# Patient Record
Sex: Male | Born: 1935 | Race: White | Hispanic: No | Smoking: Never smoker
Health system: Southern US, Community
[De-identification: ages and names within clinical notes are randomized; demographics above are authoritative.]

## PROBLEM LIST (undated history)

## (undated) DIAGNOSIS — E119 Type 2 diabetes mellitus without complications: Secondary | ICD-10-CM

## (undated) DIAGNOSIS — I214 Non-ST elevation (NSTEMI) myocardial infarction: Secondary | ICD-10-CM

## (undated) DIAGNOSIS — M1990 Unspecified osteoarthritis, unspecified site: Secondary | ICD-10-CM

## (undated) DIAGNOSIS — K219 Gastro-esophageal reflux disease without esophagitis: Secondary | ICD-10-CM

## (undated) DIAGNOSIS — I10 Essential (primary) hypertension: Secondary | ICD-10-CM

## (undated) DIAGNOSIS — J9859 Other diseases of mediastinum, not elsewhere classified: Secondary | ICD-10-CM

## (undated) DIAGNOSIS — N2881 Hypertrophy of kidney: Secondary | ICD-10-CM

## (undated) DIAGNOSIS — Z9289 Personal history of other medical treatment: Secondary | ICD-10-CM

## (undated) DIAGNOSIS — J449 Chronic obstructive pulmonary disease, unspecified: Secondary | ICD-10-CM

## (undated) DIAGNOSIS — C61 Malignant neoplasm of prostate: Secondary | ICD-10-CM

## (undated) DIAGNOSIS — I255 Ischemic cardiomyopathy: Secondary | ICD-10-CM

## (undated) DIAGNOSIS — T5994XA Toxic effect of unspecified gases, fumes and vapors, undetermined, initial encounter: Secondary | ICD-10-CM

## (undated) DIAGNOSIS — J189 Pneumonia, unspecified organism: Secondary | ICD-10-CM

## (undated) DIAGNOSIS — C4490 Unspecified malignant neoplasm of skin, unspecified: Secondary | ICD-10-CM

## (undated) DIAGNOSIS — I5032 Chronic diastolic (congestive) heart failure: Secondary | ICD-10-CM

## (undated) DIAGNOSIS — N289 Disorder of kidney and ureter, unspecified: Secondary | ICD-10-CM

## (undated) DIAGNOSIS — I420 Dilated cardiomyopathy: Secondary | ICD-10-CM

## (undated) DIAGNOSIS — B399 Histoplasmosis, unspecified: Secondary | ICD-10-CM

## (undated) DIAGNOSIS — I2510 Atherosclerotic heart disease of native coronary artery without angina pectoris: Secondary | ICD-10-CM

## (undated) DIAGNOSIS — E785 Hyperlipidemia, unspecified: Secondary | ICD-10-CM

## (undated) DIAGNOSIS — I428 Other cardiomyopathies: Secondary | ICD-10-CM

## (undated) HISTORY — DX: Personal history of other medical treatment: Z92.89

## (undated) HISTORY — DX: Unspecified malignant neoplasm of skin, unspecified: C44.90

## (undated) HISTORY — PX: FOREARM FRACTURE SURGERY: SHX649

## (undated) HISTORY — DX: Malignant neoplasm of prostate: C61

## (undated) HISTORY — DX: Hyperlipidemia, unspecified: E78.5

## (undated) HISTORY — DX: Histoplasmosis, unspecified: B39.9

## (undated) HISTORY — DX: Atherosclerotic heart disease of native coronary artery without angina pectoris: I25.10

## (undated) HISTORY — PX: FRACTURE SURGERY: SHX138

## (undated) HISTORY — PX: APPENDECTOMY: SHX54

## (undated) HISTORY — DX: Chronic diastolic (congestive) heart failure: I50.32

## (undated) HISTORY — DX: Hypertrophy of kidney: N28.81

## (undated) HISTORY — DX: Other diseases of mediastinum, not elsewhere classified: J98.59

## (undated) HISTORY — DX: Dilated cardiomyopathy: I25.5

## (undated) HISTORY — DX: Disorder of kidney and ureter, unspecified: N28.9

## (undated) HISTORY — DX: Essential (primary) hypertension: I10

## (undated) HISTORY — DX: Dilated cardiomyopathy: I42.0

## (undated) HISTORY — DX: Gastro-esophageal reflux disease without esophagitis: K21.9

## (undated) HISTORY — DX: Other cardiomyopathies: I42.8

---

## 2002-08-08 ENCOUNTER — Ambulatory Visit (HOSPITAL_COMMUNITY): Admission: RE | Admit: 2002-08-08 | Discharge: 2002-08-08 | Payer: Self-pay | Admitting: Gastroenterology

## 2004-02-25 ENCOUNTER — Ambulatory Visit (HOSPITAL_COMMUNITY): Admission: RE | Admit: 2004-02-25 | Discharge: 2004-02-25 | Payer: Self-pay | Admitting: Family Medicine

## 2004-03-01 ENCOUNTER — Inpatient Hospital Stay (HOSPITAL_COMMUNITY): Admission: AD | Admit: 2004-03-01 | Discharge: 2004-03-06 | Payer: Self-pay | Admitting: Pulmonary Disease

## 2004-03-01 ENCOUNTER — Encounter (INDEPENDENT_AMBULATORY_CARE_PROVIDER_SITE_OTHER): Payer: Self-pay | Admitting: Specialist

## 2004-03-01 HISTORY — PX: BRONCHOSCOPY: SUR163

## 2004-03-02 ENCOUNTER — Encounter (INDEPENDENT_AMBULATORY_CARE_PROVIDER_SITE_OTHER): Payer: Self-pay | Admitting: *Deleted

## 2004-03-05 ENCOUNTER — Encounter (INDEPENDENT_AMBULATORY_CARE_PROVIDER_SITE_OTHER): Payer: Self-pay | Admitting: *Deleted

## 2004-03-05 ENCOUNTER — Encounter (INDEPENDENT_AMBULATORY_CARE_PROVIDER_SITE_OTHER): Payer: Self-pay | Admitting: Specialist

## 2004-03-05 HISTORY — PX: COMBINED MEDIASTINOSCOPY AND BRONCHOSCOPY: SUR303

## 2004-03-25 ENCOUNTER — Encounter: Admission: RE | Admit: 2004-03-25 | Discharge: 2004-03-25 | Payer: Self-pay | Admitting: Thoracic Surgery

## 2004-08-18 ENCOUNTER — Ambulatory Visit (HOSPITAL_COMMUNITY): Admission: RE | Admit: 2004-08-18 | Discharge: 2004-08-18 | Payer: Self-pay | Admitting: Urology

## 2004-09-17 ENCOUNTER — Ambulatory Visit: Payer: Self-pay | Admitting: Pulmonary Disease

## 2004-11-24 ENCOUNTER — Ambulatory Visit: Payer: Self-pay | Admitting: Pulmonary Disease

## 2004-12-23 ENCOUNTER — Ambulatory Visit (HOSPITAL_COMMUNITY): Admission: RE | Admit: 2004-12-23 | Discharge: 2004-12-23 | Payer: Self-pay | Admitting: Urology

## 2004-12-29 ENCOUNTER — Ambulatory Visit: Payer: Self-pay | Admitting: Pulmonary Disease

## 2005-05-07 DIAGNOSIS — I214 Non-ST elevation (NSTEMI) myocardial infarction: Secondary | ICD-10-CM

## 2005-05-07 HISTORY — PX: CORONARY ANGIOPLASTY WITH STENT PLACEMENT: SHX49

## 2005-05-07 HISTORY — DX: Non-ST elevation (NSTEMI) myocardial infarction: I21.4

## 2005-06-02 ENCOUNTER — Inpatient Hospital Stay (HOSPITAL_COMMUNITY): Admission: EM | Admit: 2005-06-02 | Discharge: 2005-06-07 | Payer: Self-pay | Admitting: Emergency Medicine

## 2005-06-03 ENCOUNTER — Ambulatory Visit: Payer: Self-pay | Admitting: Critical Care Medicine

## 2005-06-03 ENCOUNTER — Encounter (INDEPENDENT_AMBULATORY_CARE_PROVIDER_SITE_OTHER): Payer: Self-pay | Admitting: Cardiology

## 2005-06-22 ENCOUNTER — Ambulatory Visit: Payer: Self-pay | Admitting: Pulmonary Disease

## 2005-06-30 ENCOUNTER — Encounter (HOSPITAL_COMMUNITY): Admission: RE | Admit: 2005-06-30 | Discharge: 2005-09-28 | Payer: Self-pay | Admitting: Cardiology

## 2005-07-07 ENCOUNTER — Ambulatory Visit: Payer: Self-pay | Admitting: Pulmonary Disease

## 2005-08-02 ENCOUNTER — Ambulatory Visit: Payer: Self-pay | Admitting: Pulmonary Disease

## 2005-08-04 ENCOUNTER — Ambulatory Visit: Payer: Self-pay | Admitting: Pulmonary Disease

## 2005-09-08 ENCOUNTER — Ambulatory Visit: Payer: Self-pay | Admitting: Pulmonary Disease

## 2005-09-29 ENCOUNTER — Encounter (HOSPITAL_COMMUNITY): Admission: RE | Admit: 2005-09-29 | Discharge: 2005-11-06 | Payer: Self-pay | Admitting: Cardiology

## 2005-11-22 ENCOUNTER — Ambulatory Visit: Payer: Self-pay | Admitting: Pulmonary Disease

## 2006-01-27 ENCOUNTER — Ambulatory Visit: Payer: Self-pay | Admitting: Critical Care Medicine

## 2006-02-17 ENCOUNTER — Ambulatory Visit: Payer: Self-pay | Admitting: Internal Medicine

## 2006-02-22 ENCOUNTER — Ambulatory Visit: Payer: Self-pay | Admitting: Internal Medicine

## 2006-03-23 ENCOUNTER — Ambulatory Visit: Payer: Self-pay | Admitting: Critical Care Medicine

## 2006-05-16 ENCOUNTER — Ambulatory Visit: Payer: Self-pay | Admitting: Critical Care Medicine

## 2006-05-18 ENCOUNTER — Ambulatory Visit: Payer: Self-pay | Admitting: Cardiology

## 2006-06-15 ENCOUNTER — Ambulatory Visit: Payer: Self-pay | Admitting: Critical Care Medicine

## 2006-08-15 ENCOUNTER — Ambulatory Visit: Payer: Self-pay | Admitting: Critical Care Medicine

## 2006-10-11 ENCOUNTER — Inpatient Hospital Stay (HOSPITAL_COMMUNITY): Admission: AD | Admit: 2006-10-11 | Discharge: 2006-10-13 | Payer: Self-pay | Admitting: Family Medicine

## 2006-11-14 ENCOUNTER — Ambulatory Visit: Payer: Self-pay | Admitting: Critical Care Medicine

## 2006-11-15 ENCOUNTER — Ambulatory Visit: Payer: Self-pay | Admitting: Cardiology

## 2006-12-04 ENCOUNTER — Ambulatory Visit (HOSPITAL_COMMUNITY): Admission: RE | Admit: 2006-12-04 | Discharge: 2006-12-04 | Payer: Self-pay | Admitting: Critical Care Medicine

## 2007-01-09 ENCOUNTER — Ambulatory Visit: Payer: Self-pay | Admitting: Critical Care Medicine

## 2007-05-31 ENCOUNTER — Ambulatory Visit: Payer: Self-pay | Admitting: Critical Care Medicine

## 2007-05-31 LAB — CONVERTED CEMR LAB
BUN: 36 mg/dL — ABNORMAL HIGH (ref 6–23)
CO2: 26 meq/L (ref 19–32)
Calcium: 9.7 mg/dL (ref 8.4–10.5)
Chloride: 109 meq/L (ref 96–112)
Creatinine, Ser: 1.4 mg/dL (ref 0.4–1.5)
GFR calc Af Amer: 63 mL/min
GFR calc non Af Amer: 52 mL/min
Glucose, Bld: 198 mg/dL — ABNORMAL HIGH (ref 70–99)
Potassium: 4.6 meq/L (ref 3.5–5.1)
Sodium: 141 meq/L (ref 135–145)

## 2007-06-04 ENCOUNTER — Encounter: Payer: Self-pay | Admitting: Critical Care Medicine

## 2007-06-04 ENCOUNTER — Ambulatory Visit: Payer: Self-pay | Admitting: Internal Medicine

## 2007-08-24 DIAGNOSIS — J9859 Other diseases of mediastinum, not elsewhere classified: Secondary | ICD-10-CM

## 2007-08-24 DIAGNOSIS — C61 Malignant neoplasm of prostate: Secondary | ICD-10-CM

## 2007-08-24 DIAGNOSIS — I428 Other cardiomyopathies: Secondary | ICD-10-CM

## 2007-10-12 ENCOUNTER — Ambulatory Visit: Payer: Self-pay | Admitting: Critical Care Medicine

## 2008-02-14 ENCOUNTER — Ambulatory Visit: Payer: Self-pay | Admitting: Critical Care Medicine

## 2008-02-14 DIAGNOSIS — K219 Gastro-esophageal reflux disease without esophagitis: Secondary | ICD-10-CM | POA: Insufficient documentation

## 2008-02-14 DIAGNOSIS — E1165 Type 2 diabetes mellitus with hyperglycemia: Secondary | ICD-10-CM

## 2008-02-14 DIAGNOSIS — I10 Essential (primary) hypertension: Secondary | ICD-10-CM | POA: Insufficient documentation

## 2008-05-28 ENCOUNTER — Encounter: Payer: Self-pay | Admitting: Critical Care Medicine

## 2008-08-08 ENCOUNTER — Ambulatory Visit: Payer: Self-pay | Admitting: Critical Care Medicine

## 2009-02-25 ENCOUNTER — Ambulatory Visit: Payer: Self-pay | Admitting: Critical Care Medicine

## 2009-02-26 ENCOUNTER — Ambulatory Visit (HOSPITAL_BASED_OUTPATIENT_CLINIC_OR_DEPARTMENT_OTHER): Admission: RE | Admit: 2009-02-26 | Discharge: 2009-02-26 | Payer: Self-pay | Admitting: Critical Care Medicine

## 2009-02-26 ENCOUNTER — Ambulatory Visit: Payer: Self-pay | Admitting: Critical Care Medicine

## 2009-02-26 ENCOUNTER — Ambulatory Visit: Payer: Self-pay | Admitting: Radiology

## 2009-03-17 ENCOUNTER — Ambulatory Visit: Payer: Self-pay | Admitting: Critical Care Medicine

## 2009-03-19 ENCOUNTER — Encounter: Payer: Self-pay | Admitting: Critical Care Medicine

## 2009-07-02 ENCOUNTER — Ambulatory Visit: Payer: Self-pay | Admitting: Critical Care Medicine

## 2009-10-15 ENCOUNTER — Ambulatory Visit: Payer: Self-pay | Admitting: Critical Care Medicine

## 2009-12-17 ENCOUNTER — Ambulatory Visit: Payer: Self-pay | Admitting: Critical Care Medicine

## 2010-04-21 ENCOUNTER — Encounter: Payer: Self-pay | Admitting: Critical Care Medicine

## 2010-04-22 ENCOUNTER — Ambulatory Visit: Payer: Self-pay | Admitting: Diagnostic Radiology

## 2010-04-22 ENCOUNTER — Ambulatory Visit (HOSPITAL_BASED_OUTPATIENT_CLINIC_OR_DEPARTMENT_OTHER): Admission: RE | Admit: 2010-04-22 | Discharge: 2010-04-22 | Payer: Self-pay | Admitting: Critical Care Medicine

## 2010-04-22 ENCOUNTER — Ambulatory Visit: Payer: Self-pay | Admitting: Critical Care Medicine

## 2010-04-22 DIAGNOSIS — N182 Chronic kidney disease, stage 2 (mild): Secondary | ICD-10-CM | POA: Insufficient documentation

## 2010-04-27 ENCOUNTER — Encounter: Payer: Self-pay | Admitting: Critical Care Medicine

## 2010-06-24 ENCOUNTER — Ambulatory Visit: Payer: Self-pay | Admitting: Critical Care Medicine

## 2010-09-12 ENCOUNTER — Ambulatory Visit: Payer: Self-pay | Admitting: Interventional Radiology

## 2010-09-12 ENCOUNTER — Emergency Department (HOSPITAL_BASED_OUTPATIENT_CLINIC_OR_DEPARTMENT_OTHER): Admission: EM | Admit: 2010-09-12 | Discharge: 2010-09-12 | Payer: Self-pay | Admitting: Emergency Medicine

## 2010-10-21 ENCOUNTER — Ambulatory Visit: Payer: Self-pay | Admitting: Critical Care Medicine

## 2010-11-28 ENCOUNTER — Encounter: Payer: Self-pay | Admitting: Critical Care Medicine

## 2010-11-28 ENCOUNTER — Encounter: Payer: Self-pay | Admitting: Thoracic Surgery

## 2010-12-07 NOTE — Assessment & Plan Note (Signed)
Summary: Pulmonary OV   Primary Provider/Referring Provider:  Duane Lope  CC:  2 month follow up.  Pt states breathing is the same- no better or worse.  PND has improved and cough resolved.  Denies wheezing and chest tightness.  Requesting rxs for dulera..  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     October 15, 2009 12:00 PM Sinuses were inflammed and received ABX for same per PCP  1.5 months ago.  Since then are now worse.  Now cough is sl worse. Notes some wheeze.  Drainage is from the sinues and is post nasal drip.   Dyspnea is unchanged.   Only on spiriva and no change with advair.   Mucous is yellow with blood.   December 17, 2009 9:56 AM Still with spells of cough if throat gets dry.  Dyspnea is about the same.   Spiriva has helped as has dulera.  No further sinus infx.  Does have pndrip.  April 22, 2010 2:08 PM Had sinus infection that recurred.  Saw PCP Ross last week and rx: doxy 100mg  two times a day x 30days .  Thus far is sl better. Still with pndrip and productive cough of thick mucus.  Mucus was green, now is milky color.  No real chest pain.  Pt notes more dyspnea with exertion.  Pt notes more wheeze.   No blood.  Had sinus infx before and again this spring.  Scr elevated with Dr Mayford Knife   SCr 1.4 in 2008August 18, 2011 2:14 PM No further sinus issues since 6/11.  Off abx since june and doing ok.  Now: not much cough.  No real wheeze.  Not much mucus.  Dyspnea is at baseline.    Pt denies any significant sore throat, nasal congestion or excess secretions, fever, chills, sweats, unintended weight loss, pleurtic or exertional chest pain, orthopnea PND, or leg swelling Pt denies any increase in rescue therapy over baseline, denies waking up needing it or having any early am or nocturnal exacerbations of coughing/wheezing/or dyspnea.   Preventive  Screening-Counseling & Management  Alcohol-Tobacco     Smoking Status: quit > 6 months  Current Medications (verified): 1)  Adult Aspirin Low Strength 81 Mg  Tbdp (Aspirin) .... One By Mouth Once Daily 2)  Plavix 75 Mg  Tabs (Clopidogrel Bisulfate) .... One By Mouth Once Daily 3)  Imdur 60 Mg  Tb24 (Isosorbide Mononitrate) .... One By Mouth Once Daily 4)  Furosemide 20 Mg Tabs (Furosemide) .... Every Other Day 5)  Casodex 50 Mg  Tabs (Bicalutamide) .... One By Mouth At Bedtime 6)  Glipizide 5 Mg  Tb24 (Glipizide) .... One By Mouth Once Daily 7)  Zetia 10 Mg  Tabs (Ezetimibe) .... One By Mouth Once Daily 8)  Crestor 20 Mg  Tabs (Rosuvastatin Calcium) .... One By Mouth Once Daily 9)  Coreg 25 Mg  Tabs (Carvedilol) .... One By Mouth Two Times A Day 10)  Januvia 100 Mg  Tabs (Sitagliptin Phosphate) .... One By Mouth Once Daily 11)  Losartan Potassium 50 Mg Tabs (Losartan Potassium) .... Take 1 Tablet By Mouth Once A Day 12)  Spiriva Handihaler 18 Mcg Caps (Tiotropium Bromide Monohydrate) .... Inhale Contents of A Capsule Daily Using Handihaler 13)  Omega-3 350 Mg Caps (Omega-3 Fatty Acids) .... 2 Caps Two Times A Day 14)  Centrum Silver  Tabs (Multiple Vitamins-Minerals) .... Once  Daily 15)  Dulera 200-5 Mcg/act Aero (Mometasone Furo-Formoterol Fum) .... 2 Puffs Twice Per Day 16)  Proair Hfa 108 (90 Base) Mcg/act Aers (Albuterol Sulfate) .... 2 Puffs Every 4 Hours As Needed  Allergies (verified): 1)  ! * Altace  Past History:  Past medical, surgical, family and social histories (including risk factors) reviewed, and no changes noted (except as noted below).  Past Medical History: Reviewed history from 04/22/2010 and no changes required. Current Problems:  CORONARY ARTERY DISEASE (ICD-414.00) stents 2007 ADENOCARCINOMA, PROSTATE (ICD-185) CARDIOMYOPATHY, CONGESTIVE (ICD-425.4) MEDIASTINAL FIBROSIS (ICD-519.3) GERD (ICD-530.81) Diabetes, Type 2 Hypertension Kidney Disease  Past  Surgical History: Reviewed history from 02/14/2008 and no changes required. Appendectomy  Past Pulmonary History:  Pulmonary History: Pulmonary Function Test  Date: 03/17/2009 Height (in.): 68 Gender: Male  Pre-Spirometry  FVC     Value: 3.65 L/min   Pred: 3.90 L/min     % Pred: 94 % FEV1     Value: 1.55 L     Pred: 2.53 L     % Pred: 61 % FEV1/FVC   Value: 42 %     Pred: 67 %     FEF 25-75   Value: 0.40 L/min   Pred: 2.24 L/min     % Pred: 18 %  Post-Spirometry  FVC     Value: 3.44 L/min   Pred: 3.90 L/min     % Pred: 88 % FEV1     Value: 1.56 L     Pred: 2.53 L     % Pred: 61 % FEV1/FVC   Value: 45 %     Pred: 67 %     FEF 25-75   Value: 0.46 L/min   Pred: 2.24 L/min     % Pred: 21 %  Lung Volumes  TLC     Value: 5.52 L   % Pred: 93 % RV     Value: 1.87 L   % Pred: 74 % DLCO     Value: 10.4 %   % Pred: 53 % DLCO/VA   Value: 2.55 %   % Pred: 75 %  Comments:  Severe peripheral airflow obstruction,  improved compared to 2007,  moderate  reduction in DLCO unchanged compared to 2007  Family History: Reviewed history from 02/14/2008 and no changes required. Family History COPD  brother Pancreatic CA in sister Father CHF   Social History: Reviewed history from 02/14/2008 and no changes required. Patient never smoked.  Aircraft maintainence retired Retail buyer for [employer]  Review of Systems       The patient complains of shortness of breath with activity.  The patient denies shortness of breath at rest, productive cough, non-productive cough, coughing up blood, chest pain, irregular heartbeats, acid heartburn, indigestion, loss of appetite, weight change, abdominal pain, difficulty swallowing, sore throat, tooth/dental problems, headaches, nasal congestion/difficulty breathing through nose, sneezing, itching, ear ache, anxiety, depression, hand/feet swelling, joint stiffness or pain, rash, change in color of mucus, and fever.    Vital Signs:  Patient profile:    77 year old male Height:      68.5 inches Weight:      195 pounds BMI:     29.32 O2 Sat:      96 % on Room air Temp:     97.7 degrees F oral Pulse rate:   82 / minute BP sitting:   124 / 68  (left arm) Cuff size:   regular  Vitals Entered By: Gweneth Dimitri RN (June 24, 2010 2:00 PM)  O2 Flow:  Room air CC: 2 month follow up.  Pt states breathing is the same- no better or worse.  PND has improved,  cough resolved.  Denies wheezing and chest tightness.  Requesting rxs for dulera. Comments Medications reviewed with patient Daytime contact number verified with patient. Gweneth Dimitri RN  June 24, 2010 2:01 PM    Physical Exam  Additional Exam:  Gen: Pleasant, well nourished, in no distress ENT: mild post nasal drip, no purulence Neck: No JVD, no TMG, no carotid bruits Lungs: no use of accessory muscles, no dullness to percussion, distant BS, no wheezes Cardiovascular: RRR, heart sounds normal, no murmurs or gallops, no peripheral edema Musculoskeletal: No deformities, no cyanosis or clubbing     Impression & Recommendations:  Problem # 1:  MEDIASTINAL FIBROSIS (ICD-519.3) Assessment Unchanged  Orders: Est. Patient Level III (16109)  Airway obstruction and airway inflammation on the basis of mediastinal fibrosis and asthmatic bronchitis now stable on dulera plan cont  Dulera 200 two puff two times a day, cont spiriva  Complete Medication List: 1)  Adult Aspirin Low Strength 81 Mg Tbdp (Aspirin) .... One by mouth once daily 2)  Plavix 75 Mg Tabs (Clopidogrel bisulfate) .... One by mouth once daily 3)  Imdur 60 Mg Tb24 (Isosorbide mononitrate) .... One by mouth once daily 4)  Furosemide 20 Mg Tabs (Furosemide) .... Every other day 5)  Casodex 50 Mg Tabs (Bicalutamide) .... One by mouth at bedtime 6)  Glipizide 5 Mg Tb24 (Glipizide) .... One by mouth once daily 7)  Zetia 10 Mg Tabs (Ezetimibe) .... One by mouth once daily 8)  Crestor 20 Mg Tabs (Rosuvastatin calcium)  .... One by mouth once daily 9)  Coreg 25 Mg Tabs (Carvedilol) .... One by mouth two times a day 10)  Januvia 100 Mg Tabs (Sitagliptin phosphate) .... One by mouth once daily 11)  Losartan Potassium 50 Mg Tabs (Losartan potassium) .... Take 1 tablet by mouth once a day 12)  Spiriva Handihaler 18 Mcg Caps (Tiotropium bromide monohydrate) .... Inhale contents of a capsule daily using handihaler 13)  Omega-3 350 Mg Caps (Omega-3 fatty acids) .... 2 caps two times a day 14)  Centrum Silver Tabs (Multiple vitamins-minerals) .... Once daily 15)  Dulera 200-5 Mcg/act Aero (Mometasone furo-formoterol fum) .... 2 puffs twice per day 16)  Proair Hfa 108 (90 Base) Mcg/act Aers (Albuterol sulfate) .... 2 puffs every 4 hours as needed  Patient Instructions: 1)  No change in medications 2)  Return in     4     months Prescriptions: DULERA 200-5 MCG/ACT AERO (MOMETASONE FURO-FORMOTEROL FUM) 2 puffs twice per day Brand medically necessary #3 x 4   Entered and Authorized by:   Storm Frisk MD   Signed by:   Storm Frisk MD on 06/24/2010   Method used:   Faxed to ...       [facility] (mail-order)             , [place]         Ph: [phone]       Fax: [phone]   RxID:   [id number]   Appended Document: Pulmonary OV fax alan ross

## 2010-12-07 NOTE — Letter (Signed)
Summary: Armanda Magic MD  Armanda Magic MD   Imported By: Lester Solen 05/05/2010 11:39:32  _____________________________________________________________________  External Attachment:    Type:   Image     Comment:   External Document

## 2010-12-07 NOTE — Assessment & Plan Note (Signed)
Summary: Pulmonary OV   Primary Provider/Referring Provider:  Duane Lope  CC:  2 mo follow up.  states breathing is a little better-believes dulera is helping some.  still having runny nose and cough with pale yellow mucus.  .  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     October 15, 2009 12:00 PM Sinuses were inflammed and received ABX for same per PCP  1.5 months ago.  Since then are now worse.  Now cough is sl worse. Notes some wheeze.  Drainage is from the sinues and is post nasal drip.   Dyspnea is unchanged.   Only on spiriva and no change with advair.   Mucous is yellow with blood.   December 17, 2009 9:56 AM Still with spells of cough if throat gets dry.  Dyspnea is about the same.   Spiriva has helped as has dulera.  No further sinus infx.  Does have pndrip.  Preventive Screening-Counseling & Management  Alcohol-Tobacco     Smoking Status: never  Current Medications (verified): 1)  Adult Aspirin Low Strength 81 Mg  Tbdp (Aspirin) .... One By Mouth Once Daily 2)  Plavix 75 Mg  Tabs (Clopidogrel Bisulfate) .... One By Mouth Once Daily 3)  Imdur 60 Mg  Tb24 (Isosorbide Mononitrate) .... One By Mouth Once Daily 4)  Furosemide 20 Mg Tabs (Furosemide) .... Once Daily 5)  Casodex 50 Mg  Tabs (Bicalutamide) .... One By Mouth At Bedtime 6)  Glipizide 5 Mg  Tb24 (Glipizide) .... One By Mouth Once Daily 7)  Zetia 10 Mg  Tabs (Ezetimibe) .... One By Mouth Once Daily 8)  Crestor 20 Mg  Tabs (Rosuvastatin Calcium) .... One By Mouth Once Daily 9)  Coreg 25 Mg  Tabs (Carvedilol) .... One By Mouth Two Times A Day 10)  Januvia 100 Mg  Tabs (Sitagliptin Phosphate) .... One By Mouth Once Daily 11)  Atacand 8 Mg  Tabs (Candesartan Cilexetil) .... One By Mouth Once Daily 12)  Prednisone 5 Mg Tabs (Prednisone) .Marland Kitchen.. 1 By Mouth Every Other Day 13)  Spiriva Handihaler 18 Mcg  Caps (Tiotropium Bromide Monohydrate) .... Inhale Contents of A Capsule Daily Using Handihaler 14)  Omega-3 350 Mg Caps (Omega-3 Fatty Acids) .... 2 Caps Two Times A Day 15)  Centrum Silver  Tabs (Multiple Vitamins-Minerals) .... Once Daily 16)  Dulera 200-5 Mcg/act Aero (Mometasone Furo-Formoterol Fum) .... 2 Puffs Twice Per Day  Allergies (verified): 1)  ! * Altace  Past History:  Past medical, surgical, family and social histories (including risk factors) reviewed, and no changes noted (except as noted below).  Past Medical History: Reviewed history from 08/08/2008 and no changes required. Current Problems:  CORONARY ARTERY DISEASE (ICD-414.00) stents 2007 ADENOCARCINOMA, PROSTATE (ICD-185) CARDIOMYOPATHY, CONGESTIVE (ICD-425.4) MEDIASTINAL FIBROSIS (ICD-519.3) GERD (ICD-530.81) Diabetes, Type 2 Hypertension  Past Surgical History: Reviewed history from 02/14/2008 and no changes required. Appendectomy  Past Pulmonary History:  Pulmonary History: Pulmonary Function Test  Date: 03/17/2009 Height (in.): 68 Gender: Male  Pre-Spirometry  FVC     Value: 3.65 L/min   Pred: 3.90 L/min     % Pred: 94 % FEV1     Value: 1.55 L     Pred: 2.53 L     % Pred: 61 % FEV1/FVC   Value: 42 %     Pred: 67 %     FEF 25-75   Value:  0.40 L/min   Pred: 2.24 L/min     % Pred: 18 %  Post-Spirometry  FVC     Value: 3.44 L/min   Pred: 3.90 L/min     % Pred: 88 % FEV1     Value: 1.56 L     Pred: 2.53 L     % Pred: 61 % FEV1/FVC   Value: 45 %     Pred: 67 %     FEF 25-75   Value: 0.46 L/min   Pred: 2.24 L/min     % Pred: 21 %  Lung Volumes  TLC     Value: 5.52 L   % Pred: 93 % RV     Value: 1.87 L   % Pred: 74 % DLCO     Value: 10.4 %   % Pred: 53 % DLCO/VA   Value: 2.55 %   % Pred: 75 %  Comments:  Severe peripheral airflow obstruction,  improved compared to 2007,  moderate  reduction in DLCO unchanged compared to 2007  Family History: Reviewed history from 02/14/2008 and no changes  required. Family History COPD  brother Pancreatic CA in sister Father CHF   Social History: Reviewed history from 02/14/2008 and no changes required. Patient never smoked.  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  The patient denies shortness of breath at rest, productive cough, coughing up blood, chest pain, irregular heartbeats, acid heartburn, indigestion, loss of appetite, weight change, abdominal pain, difficulty swallowing, sore throat, tooth/dental problems, headaches, sneezing, itching, ear ache, anxiety, depression, hand/feet swelling, joint stiffness or pain, rash, change in color of mucus, and fever.    Vital Signs:  Patient profile:   77 year old male Height:      69 inches Weight:      194 pounds BMI:     28.75 O2 Sat:      98 % on Room air Temp:     97.7 degrees F oral Pulse rate:   69 / minute BP sitting:   122 / 60  (left arm) Cuff size:   regular  Vitals Entered By: Gweneth Dimitri RN (December 17, 2009 9:43 AM)  O2 Flow:  Room air CC: 2 mo follow up.  states breathing is a little better-believes dulera is helping some.  still having runny nose and cough with pale yellow mucus.   Comments Medications reviewed with patient Daytime contact number verified with patient. Gweneth Dimitri RN  December 17, 2009 9:44 AM    Physical Exam  Additional Exam:  Gen: Pleasant, well nourished, in no distress ENT: no lesions, no postnasal drip Neck: No JVD, no TMG, no carotid bruits Lungs: no use of accessory muscles, no dullness to percussion, distant BS, scattered expir wheeze Cardiovascular: RRR, heart sounds normal, no murmurs or gallops, no peripheral edema Musculoskeletal: No deformities, no cyanosis or clubbing     Impression & Recommendations:  Problem # 1:   MEDIASTINAL FIBROSIS (ICD-519.3) Assessment Improved  Orders: Est. Patient Level III (29518)  Airway obstruction and airway inflammation on the basis of mediastinal fibrosis and asthmatic bronchitis now stable on dulera plan cont  Dulera 200 two puff two times a day, cont spiriva  Orders: Est. Patient Level IV (84166) Prescription Created Electronically [phone])  Complete Medication List: 1)  Adult Aspirin Low Strength 81  Mg Tbdp (Aspirin) .... One by mouth once daily 2)  Plavix 75 Mg Tabs (Clopidogrel bisulfate) .... One by mouth once daily 3)  Imdur 60 Mg Tb24 (Isosorbide mononitrate) .... One by mouth once daily 4)  Furosemide 20 Mg Tabs (Furosemide) .... Once daily 5)  Casodex 50 Mg Tabs (Bicalutamide) .... One by mouth at bedtime 6)  Glipizide 5 Mg Tb24 (Glipizide) .... One by mouth once daily 7)  Zetia 10 Mg Tabs (Ezetimibe) .... One by mouth once daily 8)  Crestor 20 Mg Tabs (Rosuvastatin calcium) .... One by mouth once daily 9)  Coreg 25 Mg Tabs (Carvedilol) .... One by mouth two times a day 10)  Januvia 100 Mg Tabs (Sitagliptin phosphate) .... One by mouth once daily 11)  Atacand 8 Mg Tabs (Candesartan cilexetil) .... One by mouth once daily 12)  Prednisone 5 Mg Tabs (Prednisone) .Marland Kitchen.. 1 by mouth every other day 13)  Spiriva Handihaler 18 Mcg Caps (Tiotropium bromide monohydrate) .... Inhale contents of a capsule daily using handihaler 14)  Omega-3 350 Mg Caps (Omega-3 fatty acids) .... 2 caps two times a day 15)  Centrum Silver Tabs (Multiple vitamins-minerals) .... Once daily 16)  Dulera 200-5 Mcg/act Aero (Mometasone furo-formoterol fum) .... 2 puffs twice per day  Patient Instructions: 1)  No change in medications 2)  Return in    4      months    Appended Document: Pulmonary OV fax alan ross

## 2010-12-07 NOTE — Assessment & Plan Note (Signed)
Summary: Pulmonary OV   Primary Provider/Referring Provider:  Duane Lope  CC:  4 month follow up.  Pt c/o PND and states this makes it more difficult to breath but overall states breathing is the same.  He also states he has a cough-occ prod with "milky" mucus.  Denies wheezing and chest tightness.  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     October 15, 2009 12:00 PM Sinuses were inflammed and received ABX for same per PCP  1.5 months ago.  Since then are now worse.  Now cough is sl worse. Notes some wheeze.  Drainage is from the sinues and is post nasal drip.   Dyspnea is unchanged.   Only on spiriva and no change with advair.   Mucous is yellow with blood.   December 17, 2009 9:56 AM Still with spells of cough if throat gets dry.  Dyspnea is about the same.   Spiriva has helped as has dulera.  No further sinus infx.  Does have pndrip.  April 22, 2010 2:08 PM Had sinus infection that recurred.  Saw PCP Ross last week and rx: doxy 100mg  two times a day x 30days .  Thus far is sl better. Still with pndrip and productive cough of thick mucus.  Mucus was green, now is milky color.  No real chest pain.  Pt notes more dyspnea with exertion.  Pt notes more wheeze.   No blood.  Had sinus infx before and again this spring.  Scr elevated with Dr Mayford Knife   SCr 1.4 in 2008  Preventive Screening-Counseling & Management  Alcohol-Tobacco     Smoking Status: quit > 6 months  Current Medications (verified): 1)  Adult Aspirin Low Strength 81 Mg  Tbdp (Aspirin) .... One By Mouth Once Daily 2)  Plavix 75 Mg  Tabs (Clopidogrel Bisulfate) .... One By Mouth Once Daily 3)  Imdur 60 Mg  Tb24 (Isosorbide Mononitrate) .... One By Mouth Once Daily 4)  Furosemide 20 Mg Tabs (Furosemide) .... Every Other Day 5)  Casodex 50 Mg  Tabs (Bicalutamide) .... One By Mouth At Bedtime 6)   Glipizide 5 Mg  Tb24 (Glipizide) .... One By Mouth Once Daily 7)  Zetia 10 Mg  Tabs (Ezetimibe) .... One By Mouth Once Daily 8)  Crestor 20 Mg  Tabs (Rosuvastatin Calcium) .... One By Mouth Once Daily 9)  Coreg 25 Mg  Tabs (Carvedilol) .... One By Mouth Two Times A Day 10)  Januvia 100 Mg  Tabs (Sitagliptin Phosphate) .... One By Mouth Once Daily 11)  Losartan Potassium 50 Mg Tabs (Losartan Potassium) .... Take 1 Tablet By Mouth Once A Day 12)  Spiriva Handihaler 18 Mcg Caps (Tiotropium Bromide Monohydrate) .... Inhale Contents of A Capsule Daily Using Handihaler 13)  Omega-3 350 Mg Caps (Omega-3 Fatty Acids) .... 2 Caps Two Times A Day 14)  Centrum Silver  Tabs (Multiple Vitamins-Minerals) .... Once Daily 15)  Dulera 200-5 Mcg/act Aero (Mometasone Furo-Formoterol Fum) .... 2 Puffs Twice Per Day 16)  Doxycycline Hyclate 100 Mg Tabs (Doxycycline Hyclate) .... Take 1 Tablet By Mouth Two Times A Day 17)  Proair Hfa 108 (90 Base) Mcg/act Aers (Albuterol Sulfate) .... 2 Puffs Every 4 Hours As Needed  Allergies (verified): 1)  ! * Altace  Past History:  Past medical, surgical, family and social histories (including risk factors) reviewed, and no changes noted (  except as noted below).  Past Medical History: Current Problems:  CORONARY ARTERY DISEASE (ICD-414.00) stents 2007 ADENOCARCINOMA, PROSTATE (ICD-185) CARDIOMYOPATHY, CONGESTIVE (ICD-425.4) MEDIASTINAL FIBROSIS (ICD-519.3) GERD (ICD-530.81) Diabetes, Type 2 Hypertension Kidney Disease  Past Surgical History: Reviewed history from 02/14/2008 and no changes required. Appendectomy  Past Pulmonary History:  Pulmonary History: Pulmonary Function Test  Date: 03/17/2009 Height (in.): 68 Gender: Male  Pre-Spirometry  FVC     Value: 3.65 L/min   Pred: 3.90 L/min     % Pred: 94 % FEV1     Value: 1.55 L     Pred: 2.53 L     % Pred: 61 % FEV1/FVC   Value: 42 %     Pred: 67 %     FEF 25-75   Value: 0.40 L/min   Pred: 2.24 L/min     %  Pred: 18 %  Post-Spirometry  FVC     Value: 3.44 L/min   Pred: 3.90 L/min     % Pred: 88 % FEV1     Value: 1.56 L     Pred: 2.53 L     % Pred: 61 % FEV1/FVC   Value: 45 %     Pred: 67 %     FEF 25-75   Value: 0.46 L/min   Pred: 2.24 L/min     % Pred: 21 %  Lung Volumes  TLC     Value: 5.52 L   % Pred: 93 % RV     Value: 1.87 L   % Pred: 74 % DLCO     Value: 10.4 %   % Pred: 53 % DLCO/VA   Value: 2.55 %   % Pred: 75 %  Comments:  Severe peripheral airflow obstruction,  improved compared to 2007,  moderate  reduction in DLCO unchanged compared to 2007  Family History: Reviewed history from 02/14/2008 and no changes required. Family History COPD  brother Pancreatic CA in sister Father CHF   Social History: Reviewed history from 02/14/2008 and no changes required. Patient never smoked.  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  The patient denies shortness of breath at rest, coughing up blood, chest pain, irregular heartbeats, acid heartburn, indigestion, loss of appetite, weight change, abdominal pain, difficulty swallowing, sore throat, tooth/dental problems, sneezing, itching, ear ache, anxiety, depression, hand/feet swelling, joint stiffness or pain, rash, and fever.    Vital Signs:  Patient profile:   77 year old male Height:      68.5 inches Weight:      198 pounds BMI:     29.78 O2 Sat:      96 % on Room air Temp:     98.0 degrees F oral Pulse rate:   71 / minute BP sitting:   124 / 68  (left arm) Cuff size:   regular  Vitals Entered By: Gweneth Dimitri RN (April 22, 2010 1:57 PM)  O2 Flow:  Room air CC: 4 month follow up.  Pt c/o PND and states this makes it more difficult to breath but overall  states breathing is the same.  He also states he has a cough-occ prod with "milky" mucus.  Denies wheezing and chest tightness.   Comments Medications reviewed with patient Daytime contact number verified  with patient. Gweneth Dimitri RN  April 22, 2010 2:00 PM    Physical Exam  Additional Exam:  Gen: Pleasant, well nourished, in no distress ENT: mild post nasal drip,  mild purulence R> L nares Neck: No JVD, no TMG, no carotid bruits Lungs: no use of accessory muscles, no dullness to percussion, distant BS, scattered expir wheeze Cardiovascular: RRR, heart sounds normal, no murmurs or gallops, no peripheral edema Musculoskeletal: No deformities, no cyanosis or clubbing     CT of Sinus  Procedure date:  04/22/2010  Findings:      Findings: There are a few insignificant retention cysts in the maxillary sinuses.  Otherwise, the maxillary, ethmoid, sphenoid and frontal sinuses are all clear.  No mucosal thickening or free fluid.  Ostiomeatal complexes are widely patent.   IMPRESSION: No inflammatory disease.  Few insignificant retention cysts in the maxillary sinuses.  Impression & Recommendations:  Problem # 1:  OTHER ACUTE SINUSITIS (ICD-461.8) Assessment Unchanged Acute rhinitis but no sinusitis on CT scan 04/22/10. plan 7days further of doxy and then stop nasal hygiene His updated medication list for this problem includes:    Doxycycline Hyclate 100 Mg Tabs (Doxycycline hyclate) .Marland Kitchen... Take 1 tablet by mouth two times a day  Orders: Est. Patient Level IV (34742) Radiology Referral (Radiology)  Problem # 2:  MEDIASTINAL FIBROSIS (ICD-519.3) Assessment: Unchanged Airway obstruction and airway inflammation on the basis of mediastinal fibrosis and asthmatic bronchitis now stable on dulera plan cont  Dulera 200 two puff two times a day, cont spiriva  Medications Added to Medication List This Visit: 1)  Furosemide 20 Mg Tabs (Furosemide) .... Every other day 2)  Losartan  Potassium 50 Mg Tabs (Losartan potassium) .... Take 1 tablet by mouth once a day 3)  Doxycycline Hyclate 100 Mg Tabs (Doxycycline hyclate) .... Take 1 tablet by mouth two times a day 4)  Proair Hfa 108 (90 Base) Mcg/act Aers (Albuterol sulfate) .... 2 puffs every 4 hours as needed  Complete Medication List: 1)  Adult Aspirin Low Strength 81 Mg Tbdp (Aspirin) .... One by mouth once daily 2)  Plavix 75 Mg Tabs (Clopidogrel bisulfate) .... One by mouth once daily 3)  Imdur 60 Mg Tb24 (Isosorbide mononitrate) .... One by mouth once daily 4)  Furosemide 20 Mg Tabs (Furosemide) .... Every other day 5)  Casodex 50 Mg Tabs (Bicalutamide) .... One by mouth at bedtime 6)  Glipizide 5 Mg Tb24 (Glipizide) .... One by mouth once daily 7)  Zetia 10 Mg Tabs (Ezetimibe) .... One by mouth once daily 8)  Crestor 20 Mg Tabs (Rosuvastatin calcium) .... One by mouth once daily 9)  Coreg 25 Mg Tabs (Carvedilol) .... One by mouth two times a day 10)  Januvia 100 Mg Tabs (Sitagliptin phosphate) .... One by mouth once daily 11)  Losartan Potassium 50 Mg Tabs (Losartan potassium) .... Take 1 tablet by mouth once a day 12)  Spiriva Handihaler 18 Mcg Caps (Tiotropium bromide monohydrate) .... Inhale contents of a capsule daily using handihaler 13)  Omega-3 350 Mg Caps (Omega-3 fatty acids) .... 2 caps two times a day 14)  Centrum Silver Tabs (Multiple vitamins-minerals) .... Once daily 15)  Dulera 200-5 Mcg/act Aero (Mometasone furo-formoterol fum) .... 2 puffs twice per day 16)  Doxycycline Hyclate 100 Mg Tabs (Doxycycline hyclate) .... Take 1 tablet by mouth two times a day 17)  Proair Hfa 108 (90 Base) Mcg/act Aers (Albuterol sulfate) .... 2 puffs every 4 hours as needed  Patient Instructions:  1)  You will sign a record release for your lab reports from Dr Armanda Magic 2)  No change in inhaled medications for now 3)  Finish doxycycline antibiotic x 7days only 4)  Return [place] 2 months    Appended  Document: Pulmonary OV fax allan ross

## 2010-12-09 NOTE — Assessment & Plan Note (Signed)
Summary: [name]   Primary Provider/Referring Provider:  Duane Lope  CC:  4 month follow up.  Pt states he has SOB with exertion.  Some days are better than others but overall unchanged.  Very little coughing - nonprod.  Denies wheezing and chest tightness.  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     October 15, 2009 12:00 PM Sinuses were inflammed and received ABX for same per PCP  1.5 months ago.  Since then are now worse.  Now cough is sl worse. Notes some wheeze.  Drainage is from the sinues and is post nasal drip.   Dyspnea is unchanged.   Only on spiriva and no change with advair.   Mucous is yellow with blood.   December 17, 2009 9:56 AM Still with spells of cough if throat gets dry.  Dyspnea is about the same.   Spiriva has helped as has dulera.  No further sinus infx.  Does have pndrip.  April 22, 2010 2:08 PM Had sinus infection that recurred.  Saw PCP Ross last week and rx: doxy 100mg  two times a day x 30days .  Thus far is sl better. Still with pndrip and productive cough of thick mucus.  Mucus was green, now is milky color.  No real chest pain.  Pt notes more dyspnea with exertion.  Pt notes more wheeze.   No blood.  Had sinus infx before and again this spring.  Scr elevated with Dr Mayford Knife   SCr 1.4 in 2008August 18, 2011 2:14 PM No further sinus issues since 6/11.  Off abx since june and doing ok.  Now: not much cough.  No real wheeze.  Not much mucus.  Dyspnea is at baseline.    Pt denies any significant sore throat, nasal congestion or excess secretions, fever, chills, sweats, unintended weight loss, pleurtic or exertional chest pain, orthopnea PND, or leg swelling Pt denies any increase in rescue therapy over baseline, denies waking up needing it or having any early am or nocturnal exacerbations of coughing/wheezing/or dyspnea.   October 21, 2010 2:25 PM No major changes since last Haynes.  Pt is doing better.  Now not much cough.  No chest pain.  No real wheeze.  Some sinus issues. Pt denies any significant sore throat, nasal congestion or excess secretions, fever, chills, sweats, unintended weight loss, pleurtic or exertional chest pain, orthopnea PND, or leg swelling Pt denies any increase in rescue therapy over baseline, denies waking up needing it or having any early am or nocturnal exacerbations of coughing/wheezing/or dyspnea.    Current Medications (verified): 1)  Adult Aspirin Low Strength 81 Mg  Tbdp (Aspirin) .... One By Mouth Once Daily 2)  Plavix 75 Mg  Tabs (Clopidogrel Bisulfate) .... One By Mouth Once Daily 3)  Imdur 60 Mg  Tb24 (Isosorbide Mononitrate) .... One By Mouth Once Daily 4)  Furosemide 20 Mg Tabs (Furosemide) .... Every Other Day 5)  Casodex 50 Mg  Tabs (Bicalutamide) .... One By Mouth At Bedtime 6)  Glipizide 5 Mg  Tb24 (Glipizide) .... One By Mouth Once Daily 7)  Zetia 10 Mg  Tabs (Ezetimibe) .... One By Mouth Once Daily 8)  Crestor 20 Mg  Tabs (Rosuvastatin Calcium) .... One By Mouth Once Daily 9)  Coreg 25 Mg  Tabs (Carvedilol) .... One By Mouth Two Times A Day 10)  Januvia 100  Mg  Tabs (Sitagliptin Phosphate) .... One By Mouth Once Daily 11)  Losartan Potassium 50 Mg Tabs (Losartan Potassium) .... Take 1 Tablet By Mouth Once A Day 12)  Spiriva Handihaler 18 Mcg Caps (Tiotropium Bromide Monohydrate) .... Inhale Contents of A Capsule Daily Using Handihaler 13)  Omega-3 350 Mg Caps (Omega-3 Fatty Acids) .... 2 Caps Two Times A Day 14)  Centrum Silver  Tabs (Multiple Vitamins-Minerals) .... Once Daily 15)  Dulera 200-5 Mcg/act Aero (Mometasone Furo-Formoterol Fum) .... 2 Puffs Twice Per Day 16)  Proair Hfa 108 (90 Base) Mcg/act Aers (Albuterol Sulfate) .... 2 Puffs Every 4 Hours As Needed 17)  Elifard Injection .... Every 6 Months  Allergies (verified): 1)  ! * Altace 2)  * Actos  Past  History:  Past medical, surgical, family and social histories (including risk factors) reviewed, and no changes noted (except as noted below).  Past Medical History: Reviewed history from 04/22/2010 and no changes required. Current Problems:  CORONARY ARTERY DISEASE (ICD-414.00) stents 2007 ADENOCARCINOMA, PROSTATE (ICD-185) CARDIOMYOPATHY, CONGESTIVE (ICD-425.4) MEDIASTINAL FIBROSIS (ICD-519.3) GERD (ICD-530.81) Diabetes, Type 2 Hypertension Kidney Disease  Past Surgical History: Reviewed history from 02/14/2008 and no changes required. Appendectomy  Past Alex History:  Alex History: Alex Function Test  Date: 03/17/2009 Height (in.): 68 Gender: Male  Pre-Spirometry  FVC     Value: 3.65 L/min   Pred: 3.90 L/min     % Pred: 94 % FEV1     Value: 1.55 L     Pred: 2.53 L     % Pred: 61 % FEV1/FVC   Value: 42 %     Pred: 67 %     FEF 25-75   Value: 0.40 L/min   Pred: 2.24 L/min     % Pred: 18 %  Post-Spirometry  FVC     Value: 3.44 L/min   Pred: 3.90 L/min     % Pred: 88 % FEV1     Value: 1.56 L     Pred: 2.53 L     % Pred: 61 % FEV1/FVC   Value: 45 %     Pred: 67 %     FEF 25-75   Value: 0.46 L/min   Pred: 2.24 L/min     % Pred: 21 %  Lung Volumes  TLC     Value: 5.52 L   % Pred: 93 % RV     Value: 1.87 L   % Pred: 74 % DLCO     Value: 10.4 %   % Pred: 53 % DLCO/VA   Value: 2.55 %   % Pred: 75 %  Comments:  Severe peripheral airflow obstruction,  improved compared to 2007,  moderate  reduction in DLCO unchanged compared to 2007  Family History: Reviewed history from 02/14/2008 and no changes required. Family History COPD  brother Pancreatic CA in sister Father CHF   Social History: Reviewed history from 02/14/2008 and no changes required. Patient never smoked.  Aircraft maintainence retired Retail buyer for [employer]  Review of Systems       The patient complains of shortness of breath with activity.  The patient denies shortness of  breath at rest, productive cough, non-productive cough, coughing up blood, chest pain, irregular heartbeats, acid heartburn, indigestion, loss of appetite, weight change, abdominal pain, difficulty swallowing, sore throat, tooth/dental problems, headaches, nasal congestion/difficulty breathing through nose, sneezing, itching, ear ache, anxiety, depression, hand/feet swelling, joint stiffness or pain, rash, change in color of mucus, and fever.  Vital Signs:  Patient profile:   77 year old male Height:      68 inches Weight:      195 pounds BMI:     29.76 O2 Sat:      97 % on Room air Temp:     97.9 degrees F oral Pulse rate:   72 / minute BP sitting:   130 / 80  (right arm) Cuff size:   regular  Vitals Entered By: Gweneth Dimitri RN (October 21, 2010 2:12 PM)  O2 Flow:  Room air CC: 4 month follow up.  Pt states he has SOB with exertion.  Some days are better than others but overall unchanged.  Very little coughing - nonprod.  Denies wheezing and chest tightness.   Comments Medications reviewed with patient Daytime contact number verified with patient. Gweneth Dimitri RN  October 21, 2010 2:13 PM    Physical Exam  Additional Exam:  Gen: Pleasant, well nourished, in no distress ENT: mild post nasal drip, no purulence Neck: No JVD, no TMG, no carotid bruits Lungs: no use of accessory muscles, no dullness to percussion, distant BS, no wheezes Cardiovascular: RRR, heart sounds normal, no murmurs or gallops, no peripheral edema Musculoskeletal: No deformities, no cyanosis or clubbing     Impression & Recommendations:  Problem # 1:  MEDIASTINAL FIBROSIS (ICD-519.3) Assessment Unchanged Airway obstruction and airway inflammation on the basis of mediastinal fibrosis and asthmatic bronchitis now stable on dulera plan cont  Dulera 200 two puff two times a day, cont spiriva  Medications Added to Medication List This Visit: 1)  Elifard Injection  .... Every 6 months  Other  Orders: Est. Patient Level III (04540)  Patient Instructions: 1)  No change in medications 2)  Return in     4     months [place] Prescriptions: SPIRIVA HANDIHALER 18 MCG CAPS (TIOTROPIUM BROMIDE MONOHYDRATE) inhale contents of a capsule daily using handihaler  #90 x 4   Entered and Authorized by:   Storm Frisk MD   Signed by:   Storm Frisk MD on 10/21/2010   Method used:   Faxed to ...       [facility] (mail-order)             , [place]         Ph: [phone]       Fax: [phone]   RxID:   [id number] DULERA 200-5 MCG/ACT AERO (MOMETASONE FURO-FORMOTEROL FUM) 2 puffs twice per day Brand medically necessary #3 x 4   Entered and Authorized by:   Storm Frisk MD   Signed by:   Storm Frisk MD on 10/21/2010   Method used:   Faxed to ...       [facility] (mail-order)             , [place]         Ph: [phone]       Fax: [phone]   RxID:   [id number]    Immunization History:  Influenza Immunization History:    Influenza:  historical (07/08/2010)   Appended Document: [name] fax alan ross

## 2011-03-22 NOTE — Assessment & Plan Note (Signed)
[facility]                             PULMONARY OFFICE NOTE   TORE, CARREKER                      MRN:          [id number]  DATE:05/31/2007                            DOB:          [birth year]    Mr. Ostergaard is a 77 year old white male with mediastinal fibrosis  presumably due to histoplasmosis seen for followup.  He is on prednisone  5 mg every other day, has weaned off Tamoxifen and itraconazole.  He is  maintaining Spiriva 1 capsule daily.  Other maintenance medicines listed  in the chart and correct as reviewed.   EXAM:  Temp 98, blood pressure 128/68, pulse 67, saturation was 95% on  room air.  CHEST:  Distant breath sounds.  No wheeze or rhonchi.  CARDIAC:  Regular rate and rhythm without S3.  No S1, S2.  ABDOMEN:  Soft, nontender.  EXTREMITIES:  No edema or clubbing.  SKIN:  Clear.  NEUROLOGIC:  Intact.   IMPRESSION:  Stable mediastinal fibrosis without significant change.   PLAN:  Maintain Spiriva and prednisone as currently dosed and repeat CT  scan of the chest to evaluate interval change in lung mass and  mediastinal fibrosis.  The patient will return to this office in 4  months.     Charlcie Cradle Delford Field, MD, [facility]  Electronically Signed    PEW/MedQ  DD: 05/31/2007  DT: 06/01/2007  Job #: [id number]   cc:   C. Duane Lope, M.D.

## 2011-03-22 NOTE — Assessment & Plan Note (Signed)
[facility]                             PULMONARY OFFICE NOTE   DRAXTON, LUU                      MRN:          [id number]  DATE:10/12/2007                            DOB:          [birth year]    [name] is a 77 year old white male with history of mediastinal  fibrosis, asthmatic bronchitis, obstructive airways disease. The patient  notes no real change in his breathing.   He is doing well on:  1. Prednisone 5 mg every other day.  2. Spiriva daily.   Other maintenance medicines are listed in the chart and are corrected as  reviewed. However, he is now on fish oil 2 b.i.d.   PHYSICAL EXAMINATION:  VITAL SIGNS:  Temperature 97, blood pressure  140/70, pulse 65, saturation 93% on room air.  CHEST:  Diminished breath sounds with prolonged expiratory phase. No  wheeze or rhonchi were noted.  CARDIAC:  Regular rate and rhythm without S3. Normal S1 and S2.  ABDOMEN:  Soft and nontender.  EXTREMITIES:  No edema or clubbing.  SKIN:  Clear.  NEUROLOGIC:  Intact.   IMPRESSION:  Mediastinal fibrosis with chronic obstructive airways  disease.   PLAN:  Maintain prednisone 5 mg every other day, Spiriva daily, and we  will see the patient back in return follow up in three months. No change  in plan of care is made.     Charlcie Cradle Delford Field, MD, [facility]  Electronically Signed    PEW/MedQ  DD: 10/12/2007  DT: 10/13/2007  Job #: [id number]   cc:   C. Duane Lope, M.D.

## 2011-03-25 NOTE — Op Note (Signed)
NAME:  [name], [name]                       ACCOUNT NO.:  [id number]   MEDICAL RECORD NO.:  [id number]                   PATIENT TYPE:  INP   LOCATION:  0343                                 FACILITY:  [facility]   PHYSICIAN:  Oley Balm. Sung Amabile, M.D. [facility]          DATE OF BIRTH:  1929-06-16   DATE OF PROCEDURE:  03/01/2004  DATE OF DISCHARGE:                                 OPERATIVE REPORT   PROCEDURE:  Bronchoscopy.   INDICATIONS:  Lung mass and bulky hilar and mediastinal adenopathy with  scattered pulmonary infiltrates, suspect malignancy.   PREMEDICATION:  Versed 6 mg IV, fentanyl 75 mcg IV.   ANESTHESIA:  Topical anesthesia was applied to the nose and throat, and 50  cc of 1% lidocaine were used during the course of the procedure.   PROCEDURE:  After adequate sedation and anesthesia, the bronchoscope was  introduced via the right naris and advanced down the posterior pharynx.  This demonstrated normal upper airway anatomy.  Vocal cords moved  symmetrically.  Further anesthesia was achieved with 1% lidocaine, and the  scope was advanced to the trachea.  Complete airway anesthesia was achieved  with 1lidocaine, and an airway examination was performed.  This demonstrated  no discrete endobronchial tumors, masses, or foreign bodies.  Segmental  bronchial anatomy was normal.  The mucosa was beefy and edematous in the  right upper lobe and right lower lobe.  There was also a nodular-appearing  abnormality on the medial wall of the left main bronchus.  After completion  of the airway examination, transbronchial needle aspirations were obtained  in the right upper lobe, main carina, and left main bronchus at the area of  that nodular lesion.  Subsequent to this, transbronchial brushings were  obtained from the right upper lobe and left lower lobes.  Lastly,  transbronchial biopsies were obtained from the left lower lobe under  fluoroscopic guidance.  Washings were obtained to clean up the  airways and  insure that there was no bleeding after biopsies.  All of these specimens  were sent for cytology, surgical pathology, and AFB studies.  After  completion of the procedure, his left lung was scanned with fluoroscopy to  make sure that no pneumothorax had been induced.  This showed no evidence of  pneumothorax.  Therefore, no chest x-ray was obtained.  The patient  tolerated the procedure well except for excessive coughing at the end of the  procedure.                                               Oley Balm Sung Amabile, M.D. [facility]    DBS/MEDQ  D:  03/01/2004  T:  03/01/2004  Job:  [id number]

## 2011-03-25 NOTE — Assessment & Plan Note (Signed)
[facility]                             PULMONARY OFFICE NOTE   ESCHER, HARR                      MRN:          [id number]  DATE:11/14/2006                            DOB:          1929-03-31    Mr. Barreiro returns today in followup. 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 The patient has underlying diabetes, coronary artery  disease, prostate carcinoma, reflux disease. Pulmonary wise, the patient  is improved with decreased shortness of breath, decreased cough. He was  hospitalized in early December for pneumonia for 2 days. He was  discharged on oral antibiotics and returns now in followup.   The patient maintains Spiriva 1 daily, tamoxifen 10 mg daily,  tioconazole 200 mg b.i.d., Actos is now off. He is the Januvia 100 mg  daily, Coreg 25 mg b.i.d., Crestor 20 mg daily, Zetia 10 mg daily,  glipizide 5 mg daily, Casodex 50 mg daily, Lasix 40 mg daily, Imdur 60  mg daily, Plavix 75 mg daily, aspirin 81 mg daily. Also, on the Atacand  8 mg daily.   PHYSICAL EXAMINATION:  Temp is 97.0, blood pressure is 128/70, pulse 68.  Saturation is 95% on room air.  CHEST: Showed diminished breath sounds with prolonged expiratory phase.  No wheeze or rhonchi were noted.  CARDIAC: Showed a regular rate and rhythm without S3. Normal S1, S2.  ABDOMEN: Soft and nontender.  EXTREMITIES: Showed no edema or clubbing or venous disease.  SKIN: Was clear.   IMPRESSION:  Is that of mediastinal fibrosis with positive response to  tamoxifen, itraconazole and prednisone therapy.   PLAN:  Is to reduce prednisone to 5 mg every other day, discontinue  further tamoxifen and itraconazole as the patient has completed a course  of therapy. Plan is for the patient to repeat CT scan of the chest to  see and look for interval change since July of 2007. Will see this  patient back in return followup  in 2 months.     Charlcie Cradle Delford Field, MD, [facility]  Electronically Signed    PEW/MedQ  DD: 11/14/2006  DT: 11/14/2006  Job #: [id number]   cc:   C. Duane Lope, M.D.

## 2011-03-25 NOTE — Assessment & Plan Note (Signed)
[facility]                               PULMONARY OFFICE NOTE   TREMELL, REIMERS                      MRN:          [id number]  DATE:06/15/2006                            DOB:          1929-01-11    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  The  patient is now on tamoxifen therapy 10 mg daily and itraconazole at 200 mg  b.i.d. in an effort to reduce the size of the mediastinal granulomas.  Maintaining prednisone at 5 mg daily.  Spiriva daily.  Nexium has been re-  started by the patient at 40 mg daily due to the fact of the patient's  increasing reflux symptoms off Nexium.  We had stopped Nexium in May because  of concerns for drug interaction with itraconazole.  EKG obtained today did  not show elevation of the QTC interval and normal EKG.  Patient still quite  dyspneic with minimal exertion and worse in the heat exposure.   PHYSICAL EXAMINATION:  Temp:  97.  Blood pressure:  120/64.  Pulse:  67.  Saturation 96% on room air.  CHEST:  Showed fixed expired wheezes.  No rhonchi.  CARDIAC:  Regular rate and rhythm, without S3.  Normal S1 and S2.  ABDOMEN:  Soft, nontender.  EXTREMITIES:  Showed no edema or clubbing.  SKIN:  Clear.  NEUROLOGIC:  Intact.  HEENT:  Showed no jugular venous distention, no lymphadenopathy.  Oropharynx  clear.  Neck supple.   IMPRESSION:  Is that of mediastinal granulomas with poor response to  tamoxifen and itraconazole therapy, prednisone.   PLAN:  Is to maintain current therapy as is and repeat a CT scan again in  October.  If no change then, will consider discontinuance of tamoxifen,  itraconazole.                                   [name] Delford Field, MD, FCCP   PEW/MedQ  DD:  06/15/2006  DT:  06/15/2006  Job #:  [id number]   cc:   C. Duane Lope, MD

## 2011-03-25 NOTE — Discharge Summary (Signed)
NAME:  [name], [name]                       ACCOUNT NO.:  [id number]   MEDICAL RECORD NO.:  [id number]                   PATIENT TYPE:  INP   LOCATION:  0343                                 FACILITY:  [facility]   PHYSICIAN:  Oley Balm. Sung Amabile, M.D. [facility]          DATE OF BIRTH:  05-14-1929   DATE OF ADMISSION:  03/01/2004  DATE OF DISCHARGE:  03/06/2004                                 DISCHARGE SUMMARY   DISCHARGE DIAGNOSIS:  Hilar and mediastinal lymphadenopathy with pulmonary  infiltrate, status post fiberoptic bronchoscopy, fine-needle aspiration and  mediastinoscopy with a granulomatous process.   HISTORY OF PRESENT ILLNESS:  [name] is a 77 year old white male  referred by Dr. Vianne Bulls with 6 months of progressive dyspnea.  His  shortness of breath has become quite severe over the last week or two.  His  cough is productive of thick white mucus.  He had occasional hemoptysis.  He  had some subjective fevers but no weight loss.  For this reason, he was  admitted after having a CT of the chest which showed abnormalities.   LABORATORY DATA:  Report of cytopathology from fine-needle aspiration:  Atypia is favored, reactive in nature, however, malignancy cannot be  excluded.  Sodium 135, potassium 4.3, chloride 103, CO2 is 24, glucose 172,  BUN is 34, creatinine 1.4, calcium 8.9.  WBC is 20, hemoglobin 13.1,  hematocrit 37.4, platelets are 493,000.  INR is 1.0, PTT is 34.  AFB is  negative but is still in process.  Fungus demonstrates no fungus.  Report of  cytopathology from March 01, 2004 from fiberoptic bronchoscopy demonstrates  benign bronchial mucosa and lung parenchyma, no malignancy identified,  bronchial washing with no malignant cells identified.   HOSPITAL COURSE:  PROBLEM #1 - DYSPNEA WITH MEDIASTINAL ADENOPATHY:  Mr.  Haynes was admitted to [facility].  He underwent a fiberoptic  bronchoscopy per Dr. Onalee Hua B. Simonds that was nondiagnostic on March 01, 2004; therefore, he underwent a fine-needle aspirate by Dr. Sherrine Maples T.  Fredia Sorrow on March 02, 2004 which was again nondiagnostic; for that reason,  Dr. Ines Bloomer was __________  of the cardiovascular/thoracic service  and he underwent a mediastinoscopy on March 05, 2004.  Although the final  biopsies are not back, his left lower lobe lesion was suspected  granulomatosis.  For that reason, he will be ready for discharge home in the  a.m.   PROBLEM #2 - DIABETES MELLITUS:  Diabetes mellitus was controlled with oral  agents.   PROBLEM #3 - HYPERTENSION:  He remains on his oral antihypertensives.   DISCHARGE MEDICATIONS:  1. Altace 10 mg daily.  2. Hydrochlorothiazide 12.5 mg per day.  3. Glipizide 5 mg daily.  4. Actos 15 mg daily.  5. Tylenol 325 mg 1-2 tabs every 6 hours as needed for pain.   DIET:  Diet is no concentrated sweets.   WOUND CARE:  Mediastinal wound  is to be treated with a dry dressing as  needed.   FOLLOWUP:  He has a got a followup appointment with Dr. Sung Amabile on Mar 31, 2004 at 3:20 p.m. and appointment will be made for him to follow up with Dr.  Karle Plumber at the appropriate time.     Brett Canales Minor, A.C.N.P. [facility]                 Oley Balm. Sung Amabile, M.D. [facility]    SM/MEDQ  D:  03/05/2004  T:  03/05/2004  Job:  [id number]   cc:   C. Duane Lope, M.D.  [address]  [place]  [place] [address]  Fax: [phone]   Ines Bloomer, M.D.  [address]  [place]  [place] [address]

## 2011-03-25 NOTE — H&P (Signed)
NAMEHUTCHINSON, [name]             ACCOUNT NO.:  [id number]   MEDICAL RECORD NO.:  [id number]          PATIENT TYPE:  INP   LOCATION:  2920                         FACILITY:  [facility]   PHYSICIAN:  Armanda Magic, M.D.     DATE OF BIRTH:  03-Jan-1929   DATE OF ADMISSION:  06/02/2005  DATE OF DISCHARGE:                                HISTORY & PHYSICAL   ADMITTING PHYSICIAN:  Armanda Magic, M.D.   PRIMARY CARE PHYSICIAN:  Dr. Tenny Craw.   CONSULTING CARDIOLOGIST:  Lyn Records, M.D.   CHIEF COMPLAINT:  Chest pain with positive cardiac enzymes.   HISTORY AND PHYSICAL:  [name] is a 77 year old male patient with prior  history of hypertension and diabetes x8 years.  He was sent to the ER via  EMS after abrupt onset of exertional chest discomfort.  [place]  He was on a float kick paddling.  He developed  significant shortness of breath.  Family members pulled him into the boat.  The shortness of breath persisted, and then progressed into cramp-like mid  chest pain that radiated up into the upper shoulders.  The shortness of  breath decreased somewhat with the onset of the chest pain.  The initial  chest pain was level 6/10.  Duration from initial onset was 3 to 3.5 hours.  EMS was called.  The patient was taken to [facility].  En route, he  received 4 baby and 2 sublingual nitroglycerin with only slight diminishment  in the pain.  In the ER, he was started on IV nitroglycerin and given IV  morphine with decrease in pain to a level 1/10.  He was not experiencing any  diaphoresis or nausea with this discomfort.  In the ER, and en route via  EMS, EKGs were non-ischemic with sinus rhythm, except for a T wave inversion  in lead aVL.  No old EKGs for comparison.   REVIEW OF SYSTEMS:  No recent long trips or airplane travel suggestive of  DVT of pulmonary embolus risk factors.  He has had increased shortness of  breath for several years progressive.  He has  had no lower extremity  swelling, no palpitations, no near syncope.  He recently has developed some  intermittent left hand tingling involving the lateral 3 fingers, and this  has been going on for about 1-2 weeks, not prolonged in duration.   PAST MEDICAL HISTORY:  1.  Prostate cancer.  2.  Diabetes x8 years.  3.  Hypertension.  4.  Gastroesophageal reflux disease.   FAMILY HISTORY:  Mother with insignificant history.  Father died of an MI at  age 71; onset of coronary artery disease in his early 98s.  Brothers and  sisters with cancer.   SOCIAL HISTORY:  He has never smoked.  He is married.  He drinks alcohol  only on a social basis.  He is partially retired.  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   ALLERGIES:  No known drug allergies.   MEDICATIONS:  1.  Actos 30 mg daily.  2.  Glipizide 5 mg daily.  3.  Diovan HCT 160/25 daily.  4.  Nexium 40 mg daily.  5.  Aspirin 81 mg daily.  6.  Multivitamin, Centrum Silver, one daily.  7.  Casodex one daily.   PHYSICAL EXAMINATION:  GENERAL:  A pleasant male currently complaining of  mild chest pain, level 1/10.  VITAL SIGNS:  Temperature 99.4, blood pressure 131/71, heart rate 81,  respirations 18.  HEENT:  Head is normocephalic.  Sclerae are non-injected.  NECK:  Supple.  No adenopathy.  NEUROLOGIC:  The patient is alert and oriented x3, moving all extremities  x4.  No focal neurological deficits.  Strength is 5/5.  CHEST:  Bilateral crackles one-third down.  He is currently on nasal cannula  O2 satting at 96%.  He is non-labored, Surveyor, minerals on a stretcher.  HEART:  Heart sounds are distant.  He is in sinus rhythm on the bedside  monitor.  There are no carotid bruits.  Carotids were 2+ bilaterally.  No  JVD noted on exam.  He is currently on IV nitroglycerin at 6 cc an hour.  ABDOMEN:  Soft, nontender, nondistended, without hepatosplenomegaly, masses,  or bruits.  Bowel sounds are present.  EXTREMITIES:  Symmetrical in  appearance without edema, cyanosis, or  clubbing.  Pulses are 2+ bilaterally, radial, femoral, and pedal.   LABORATORY DATA:  Sodium 138, potassium 4.9, BUN 30, creatinine 1.5, white  count 9100, hemoglobin 12.1, hematocrit 34.7, platelet count 247,000.  Initial myoglobin 237, MB 3.5, troponin I 0.12.  The second set of point of  care enzymes had been drawn prior to coming to the cath lab, but are not  resulted.  PT 14.0, INR 1.1.  Diagnostics and EKG done in the ER show a  sinus rhythm, first degree AV block, ventricular rate of 79, QTC 445 ms.  T  wave inversion in aVL.  Low voltage R waves in the precordial leads.  Portable chest x-ray shows questionable right lower lobe pneumonia  superimposed on known chronic perihilar interstitial disease.  I reviewed  this chest x-ray with Dr. Mayford Knife.  It appears more consistent with CHF,  especially with pronounced visualization of fissures on exam.   IMPRESSION AND PLAN:  1.  Chest pain with probable acute coronary syndrome, non-ST segment      elevated myocardial infarction.  2.  Hypertension, currently controlled.  3.  Adult diabetes.  4.  Gastroesophageal reflux disease.  5.  Prostate cancer.   PLAN:  1.  The patient has been taken to the catheterization laboratory urgently      per Dr. Mayford Knife.  Additional treatment recommendations based on      catheterization findings.  2.  The patient has received aspirin and 5000 units of IV heparin prior to      coming to the catheterization lab.  3.  We will check serial cardiac isoenzymes, check a BNP due to issues      related to congestive heart failure, and give Lasix 40 mg IV x1.  4.  Minimum telemetry bed admit; upgrade pending catheterization results.      EKG and BMET in the morning post catheterization.  5.  Lipid status is unknown.  Check a fasting lipid panel.  6.  Blood pressure is controlled based on catheterization findings.  Will     either continue Diovan HCTZ or change to beta  blocker, ACE inhibitor      combinations pending catheterization findings.  7.  The patient is  not on Glucophage.  We will continue Actos and glipizide      as preadmission.  Check a hemoglobin A1C.  We will check Glucometers      a.c. and hour of sleep, and use sliding scale insulin as indicated.  8.  With his history of gastroesophageal reflux disease, will continue his      Nexium.  9.  With his history of prostate cancer, will continue Casodex.      Allison L. Ulla Gallo, M.D.  Electronically Signed    ALE/MEDQ  D:  06/02/2005  T:  06/03/2005  Job:  [id number]   cc:   Dr. Tenny Craw

## 2011-03-25 NOTE — H&P (Signed)
NAME:  [name], [name]             ACCOUNT NO.:  [id number]   MEDICAL RECORD NO.:  [id number]          PATIENT TYPE:  INP   LOCATION:  5506                         FACILITY:  [facility]   PHYSICIAN:  [name], MDDATE OF BIRTH:  01-17-1929   DATE OF ADMISSION:  10/11/2006  DATE OF DISCHARGE:                              HISTORY & PHYSICAL   CHIEF COMPLAINT:  Cough and shortness of breath and fever.   HISTORY OF PRESENT ILLNESS:  [name] is a 77 year old white male  patient who was directly admitted from Dr. Recardo Evangelist office with  pneumonia.  He has developed a progressive cough for the past few days  productive of clear, frothy sputum.  He had a fever up to 102 last  night.  He feels short of breath.  He was wheezing last night.  He has  had no sick contacts.  He has had no diarrhea.  His appetite has been  good.  No vomiting.  He did report urinary frequency last night.  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   PAST MEDICAL HISTORY:  1. As above.  2. Also coronary artery disease with history of myocardial infarction      and stent placement.  Last ejection fraction was 50% by MUGA scan.  3. History of prostate cancer with evidence of metastases but      apparently in remission.  4. Diabetes.  5. Hypertension.  6. Gastroesophageal reflux disease.   MEDICATIONS:  1. Tamoxifen 10 mg daily.  2. Casodex 50 mg nightly.  3. Coreg 25 mg p.o. b.i.d.  4. Glipizide 5 mg nightly.  5. Zetia 10 mg nightly.  6. Crestor 20 mg daily.  7. Januvia 100 mg a day.  8. Multivitamin a day.  9. Aspirin 81 mg a day.  10.Plavix 75 mg a day.  11.Atacand 10 mg a day.  12.Imdur 60 mg daily.  13.Lasix 40 mg a day.  14.Prednisone 5 mg a day.  15.Calcium with vitamin D 600 mg daily.  16.Itraconazole 100 mg p.o. b.i.d.  17.Spiriva 18 mcg daily.   HE HAS AN  INTOLERANCE TO ACE INHIBITORS WHICH CAUSE A COUGH.   FAMILY HISTORY:  His father died of heart failure at age 54.  His mother  died at age 69.  His sister had pancreatic cancer.  Brother with lung  disease.   PAST SURGICAL HISTORY:  Appendectomy.   SOCIAL HISTORY:  The patient drinks a drink a day.  He is married.  He  has no smoking history.   REVIEW OF SYSTEMS:  As above, otherwise negative.   PHYSICAL EXAMINATION:  His temperature is 97.7, pulse 64, respiratory  rate 20, blood pressure 104/53, oxygen saturation here is 95% on room  air, in the office was 90% on room air.  GENERAL:  The patient is well-nourished, well-developed, who is coughing  but otherwise in no distress.  HEENT: Normocephalic, atraumatic.  Pupils equal, round, reactive to  light.  Moist mucous membranes.  NECK:  Supple.  No lymphadenopathy.  No JVD.  No thyromegaly.  LUNGS:  He has rales on the left day and the lower left anterior lung  Haynes.  No wheeze or rhonchi.  CARDIOVASCULAR:  Regular rate and rhythm without murmurs, gallops or  rubs.  ABDOMEN:  Normal bowel sounds, soft, nontender, nondistended.  GU AND RECTAL:  Deferred.  EXTREMITIES: No clubbing or cyanosis; 1+ edema pitting edema.  Pulses  are intact.  PSYCHIATRIC:  Normal affect.  NEUROLOGIC:  Alert and oriented.  Cranial nerves and sensorimotor exam  are intact.  SKIN:  No rash.   LABS:  Are pending.  A PA and lateral chest x-ray from the office shows  mediastinal adenopathy which appears unchanged but there is an area  along the left heart border and posteriorly on the lateral view which  looks to be a new airspace disease to my reading as compared to x-ray  from 2 years ago.   ASSESSMENT/PLAN:  1. Left lower lobe pneumonia.  The patient was borderline hypoxic in      the office and he is being admitted to the floor for intravenous      antibiotics, oxygen and supportive care.  I will order albuterol as      needed as he did report  wheezing last night.  He has none now.  I      will check blood cultures and sputum culture.  2. Urinary frequency.  I will check a urinalysis.  3. Diabetes.  Continue outpatient medications.  Diabetic diet.      Monitor blood glucose exam are  4. Hypertension.  Continue outpatient medications.  5. History of coronary artery disease with stent placement and low      normal ejection fraction.  6. History of prostate cancer.  7. History of histoplasmosis with chronic dyspnea, mediastinal      adenopathy and granulomatous lung disease.  8. Gastroesophageal reflux disease.      Alex L. Lendell Caprice, MD  Electronically Signed     CLS/MEDQ  D:  10/11/2006  T:  10/12/2006  Job:  [id number]   cc:   [name], M.D.  Alex Cradle Delford Field, MD, FCCP  [name], M.D.  [name]

## 2011-03-25 NOTE — Op Note (Signed)
NAME:  [name], [name]                       ACCOUNT NO.:  [id number]   MEDICAL RECORD NO.:  [id number]                   PATIENT TYPE:  OUT   LOCATION:  DFTL                                 FACILITY:  [facility]   PHYSICIAN:  Ines Bloomer, M.D.              DATE OF BIRTH:  [birth year]   DATE OF PROCEDURE:  03/05/2004  DATE OF DISCHARGE:                                 OPERATIVE REPORT   PREOPERATIVE DIAGNOSES:  1. Bilateral pulmonary infiltrate.  2. Mediastinal adenopathy.   POSTOPERATIVE DIAGNOSES:  Granulomatous process.   PROCEDURE PERFORMED:  Fiberoptic bronchoscopy, mediastinoscopy.   After general anesthesia, the fiberoptic bronchoscope was passed through the  endotracheal tube.  There was a lot of inflammatory tissue and a lot of  bronchitis.  The carina was in the midline.  The right upper lobe, right  middle lobe, and right lower lobe orifices were normal.  On the left side,  the left upper lobe orifices were normal although somewhat inflamed.  The  left lower lobe has an area of granulation tissue that was very small.  This  could be where a previous biopsy had been done, and repeated, biopsied this  and several other areas around it.  The bronchoscope was removed and the  anterior neck was prepped and draped in the usual sterile manner.  A  transverse incision was made.  This was carried down with electrocautery  through the subcutaneous tissue.  The pretracheal fascia was entered.  Biopsies of a 1R node were done as well as biopsies of a 4R node, and these  appeared to be a granulomatous process with a lot of inflammation and  fibrosis.  Cultures were taken of these nodes.  The strap muscles were  closed with 2-0 Vicryl and the subcutaneous tissue with 3-0 Vicryl and  Dermabond for the skin.  The patient was returned to the recovery room in  stable condition.                                               Ines Bloomer, M.D.    DPB/MEDQ  D:  03/05/2004  T:   03/05/2004  Job:  [id number]   cc:   Oley Balm. Sung Amabile, M.D. [facility]

## 2011-03-25 NOTE — Discharge Summary (Signed)
[name], VANTERPOOL             ACCOUNT NO.:  [id number]   MEDICAL RECORD NO.:  [id number]          PATIENT TYPE:  INP   LOCATION:  2016                         FACILITY:  [facility]   PHYSICIAN:  Armanda Magic, M.D.     DATE OF BIRTH:  December 01, 1928   DATE OF ADMISSION:  06/02/2005  DATE OF DISCHARGE:  06/07/2005                                 DISCHARGE SUMMARY   ADMITTING PHYSICIAN:  Armanda Magic, M.D.   PRIMARY CARE PHYSICIAN:  Dr. Tenny Craw.   CONSULTING CARDIOLOGIST:  Verdis Prime, M.D.   DISCHARGING PHYSICIAN:  Armanda Magic, M.D.   CHIEF COMPLAINT REASON FOR ADMISSION:  Mr. Decesare is a 77 year old male  patient prior history of hypertension and diabetes for 8 years who was  evaluated by [place] in the ER due to abrupt onset of exertional chest pain that  [place].  This pain was prolonged in  nature, about 3 to 3-1/2 hours before the patient finally arrived to the ER.  He was brought in by EMS.  His EKG showed essentially nonischemic changes  with sinus rhythm.  There was T-wave inversion in lead AVL, no old EKGs for  comparison.  Subsequent cardiac isoenzymes revealed a troponin I of 0.12  with an MB of 3.5 and a myoglobin of 237.  Patient was experiencing at this  point very mild chest pain of 1/2.  It was felt that patient was  experiencing acute non-ST segment elevated MI and was taken urgently to the  Cath Lab per Dr. Mayford Knife.   In the Cath Lab the patient was found to have disease both in the LAD as  well as in the circumflex and RCA.  The LAD was subsequently intervened on  per Dr. Katrinka Blazing with PCI as well as TAXUS stent  implantation with  reproduction of presenting symptoms with PCI and implantation of stent.  This patient was admitted by Dr. Mayford Knife with the following diagnoses:  1.  Acute non-ST elevated MI.  2.  Moderate LV dysfunction with an EF of 35-45%.  3.  Hypertension, controlled.  4.  Diabetes.  5.  GERD.  6.  History of prostate  cancer.   HOSPITAL COURSE:  1.  Non-ST segment elevation MI.  As stated above, patient was taken to Cath      Lab where he was found to have three vessel disease and subsequently the      LAD was intervened on.  There was distal disease in the LAD which was      not amenable to PCI.  There was residual nonobstructive disease in the      left circumflex and RCA.  In the postop procedure period patient was      started on aspirin and Plavix, low dose Coreg.  Because there was a      finding of moderate LV dysfunction, he was also started on Altace.  He      was also given Lasix because of presenting symptoms with some shortness      of breath progressive in nature as well as crackles on  exam and abnormal      chest x-ray were consistent with probable CHF exacerbation secondary to      ischemic etiology.  He was also continued on IV nitroglycerin for a      short period of time post procedure.   Subsequently, the IV NTG was switched over to Imdur.  He had also been on  Integrilin for 24 hours post procedure, this was discontinued.  Because of  his history of dyspnea on exertion for 2 years and abnormal chest x-ray  findings, a D-dimer was checked to rule out PE as well as a 2D  echocardiogram was checked to rule out pericardial effusion and  pericarditis.  Echo did not demonstrate any of these findings.   Because of the persistent issues related to dyspnea, Dr. Mayford Knife further  questioned the patient and discovered he had a history of a fungal ball  which he had seen Dr. Darrol Angel for several years previously.  Pulmonary was  consulted.  They felt that the changes on EKG in the right lower lobe were  consistent with a community acquired pneumonia.  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  Additional workup by Pulmonary revealed positive sputum cultures for  pneumococcal pneumonia.  Mediastinal lymphadenopathy was evaluated by CT  scan and this  showed calcification.  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 Darrol Angel in the outpatient setting.   With the addition of regular Lasix to the patient's regimen for LV  dysfunction, the patient developed some mild renal insufficiency so Altace  was discontinued in favor of increasing Coreg.  He was also switched from  preadmission medication of Diovan HCT to plain Avapro.  On date of discharge  patient's BUN was 38 and creatinine was 1.8.   As part of routine surveillance on an acute MI patient, a lipid panel was  checked.  Cholesterol was 177, triglycerides 129, LDL cholesterol 112, HDL  cholesterol 39.  The patient had been empirically started on Zocor upon  admission and we will check a statin panel in our office in 6 weeks.   FINAL DIAGNOSES:  1.  Status post non-Q-wave non-ST elevated myocardial infarction with      percutaneous coronary intervention to the left anterior descending and      residual disease in the circumflex and right coronary artery      nonobstructive.  2.  Moderate left ventricular dysfunction with ejection fraction 35-40%.  3.  Akinesis of the basal and mid anteroseptal wall as well as akinesis of      the mid inferior wall with mild MR found on echocardiogram.  4.  Dyslipidemia with elevated LDL, suboptimal HDL, goal for LDL less than      or equal to 70.  5.  Hypertension, currently controlled.  6.  Pneumococcal pneumonia on Avelox.  7.  Diabetes with hemoglobin A1c 5.4 on oral agents.  8.  Chronic dyspnea secondary to pulmonary fibrosis.  9.  Mediastinal lymphadenopathy with calcification.  10. History of prostate cancer.   DISCHARGE MEDICATIONS:  1.  Actos 30 mg daily.  2.  Glipizide 5 mg daily.  3.  Diovan HCT, stop this medication.  4.  Nexium 40  mg daily.  5.  Zocor 40 mg daily.  6.  Casodex 50 mg daily. 7.  Plavix 75 mg daily.   8.  Aspirin 325 mg daily.  9.  Avelox 400 mg daily for 10 days.  10. Coreg 12.5 mg tablets, take 1/2 tablet twice daily.  11. Avapro 150 mg daily.  12. Imdur 60 mg daily.  13. Lasix 20 mg daily.   DIET:  Heart healthy, diabetic.   ACTIVITY:  Increase activity slowly.   REFERRAL:  Recommend continuing cardiac rehab as an outpatient.  [facility] will call you after your discharge.   FOLLOWUP APPOINTMENT:  1.  He is to have a BMET drawn at Dr. Norris Cross office on Monday, June 13, 2005, at 9:00 a.m.  2.  He is to followup with Dr. Mayford Knife in the office on June 21, 2005, at      9:45 a.m. and he is to have a fasting statin panel on July 19, 2005, at 7:30 a.m.      Allison L. Ulla Gallo, M.D.  Electronically Signed    ALE/MEDQ  D:  06/07/2005  T:  06/07/2005  Job:  [id number]   cc:   Oley Balm. Sung Amabile, M.D. [facility]  [address]  [place]  [place] [address]   Tenny Craw, Dr.

## 2011-03-25 NOTE — Cardiovascular Report (Signed)
NAMEARLIE, RIKER NO.:  [id number]   MEDICAL RECORD NO.:  [id number]          PATIENT TYPE:  INP   LOCATION:  2920                         FACILITY:  [facility]   PHYSICIAN:  Armanda Magic, M.D.     DATE OF BIRTH:  1929-09-08   DATE OF PROCEDURE:  06/02/2005  DATE OF DISCHARGE:                              CARDIAC CATHETERIZATION   REFERRING PHYSICIAN:  Dr. Miguel Aschoff.   PROCEDURES:  Left heart catheterization, coronary angiography, left  ventriculography.   INDICATIONS:  Acute MI.   COMPLICATIONS:  None.   IV ACCESS:  The right femoral artery with 6-French sheath.   This is a 77 year old very pleasant white male with a history diabetes  mellitus, hypertension who [place],  [place], kicking and paddling on a board and all the sudden had sudden  onset of shortness of breath and then developed substernal chest pain. The  pain radiated into his shoulder. The pain was described as 6 out of 10 and  lasted about three to three and half hours from onset to relief.  He called  EMS and went to the emergency room. He was given four baby aspirins and two  sublingual nitroglycerin's with some decrease in pain. Currently, his pain  is around the 1-2/10. He denies any diaphoresis or nausea. EKG in the  emergency room is not ischemic.   Past medical history is significant for prostate cancer, adult onset  diabetes mellitus for eight years, hypertension, and gastroesophageal  reflux. He now presents for cardiac catheterization.   The patient is brought to cardiac catheterization laboratory in the setting  of acute MI. The patient was connected to continuous heart rate and pulse  oximetry monitoring and intermittent blood pressure monitoring. The right  groin was prepped and draped in sterile fashion. Xylocaine 1% was used for  local anesthesia. Using modified Seldinger technique, a 6-French sheath was  placed in the right femoral  artery. Under fluoroscopic guidance, a 6-French  JL-4 catheter was placed in the left coronary artery. Adequate engagement of  coronary ostium could not be achieved. The catheter was exchanged over a  guide wire for 6-French JR-3.5 catheter; again, it could not cannulate the  coronary ostium. Catheter was exchanged over a guide wire and a 6-French JR-  4 catheter was placed in the right coronary artery. Multiple cine films were  taken at 30 degree RAO and 40 degree LAO views. This catheter was then  exchanged over a guidewire for 6-French JL-5 catheter which was placed in  the left coronary artery. Multiple cine films were taken at 30 degree RAO  and 40 degree LAO views. This catheter was then exchanged over a guidewire  for a 6-French angled pigtail catheter which was placed on fluoroscopic  guidance in the left ventricular cavity. Left ventriculography was performed  in 30 degree RAO view using total of 30 cc of contrast at 15 cc per second.  The catheter was then pulled back across the aortic valve with no  significant gradient noted.   At the end of the procedure,  all catheters were removed and the patient went  on to angioplasty of the LAD by Dr. Verdis Prime.   RESULTS:  Left main coronary artery is heavily calcified and bifurcates into  a left anterior descending artery and left circumflex artery. Left anterior  descending artery is heavily calcified anteriorly. It gives rise to a first  diagonal branch and has a 70% proximal narrowing. It then gives rise to a  second diagonal. In between the first and second diagonals, there is an 80%  narrowing. There is a third diagonal that comes off the LAD which is patent  and then distally in LAD there is sequential lesions 60-70% and 80%.   Left circumflex is widely patent throughout the course giving rise to an  obtuse marginal one and two branches both of which are widely patent. A  third obtuse marginal branch has a 60% proximal  narrowing.   The right coronary artery has luminal irregularities up to 20%.   Left ventriculography shows moderate LV dysfunction, EF of 35-40% with a  focal mid inferior akinesis and mid anterior akinesis, aortic pressure  148/85 mmHg, LV pressure 149/21 mmHg.   ASSESSMENT:  1.  One-vessel obstructive coronary disease.  2.  Acute non-Q-wave myocardial infarction.  3.  Mild left ventricular dysfunction.  4.  Diabetes mellitus.  5.  Hypertension.  6.  Gastroesophageal reflux.  7.  Prostate cancer.   PLAN:  Admit to CCU. Review films with Dr. Katrinka Blazing, questionable PCI to LAD.  Aspirin, Plavix, IV heparin drip, IV nitro drip, start Coreg 3.125  milligrams b.i.d. Add Altace 2.5 milligrams and then check a fasting lipid  panel.      Armanda Magic, M.D.  Electronically Signed     TT/MEDQ  D:  06/02/2005  T:  06/03/2005  Job:  [id number]   cc:   Miguel Aschoff, M.D.  [address], [address]  [place]  [place] [address]  Fax: [phone]

## 2011-03-25 NOTE — Cardiovascular Report (Signed)
[name], [name] NO.:  [id number]   MEDICAL RECORD NO.:  [id number]          PATIENT TYPE:  INP   LOCATION:  2920                         FACILITY:  [facility]   PHYSICIAN:  Armanda Magic, M.D.     DATE OF BIRTH:  1929-08-01   DATE OF PROCEDURE:  06/03/2005  DATE OF DISCHARGE:                              CARDIAC CATHETERIZATION   REFERRING PHYSICIAN:  Is Dr. Duane Lope.   PROCEDURES:  Coronary angiography.   OPERATOR:  Armanda Magic, M.D.   INDICATIONS:  Chest pain in the setting of recent non-Q-wave MI.   COMPLICATIONS:  None.   IV ACCESS:  Via right femoral artery 6-French sheath.   This is a very pleasant 77 year old white male who presented yesterday with  an acute non-Q-wave MI and underwent cardiac catheterization revealing a  high-grade lesion in the proximal LAD. He underwent PTCA stenting of the LAD  by Dr. Katrinka Blazing and was doing well until this morning around 9 o'clock when he  developed sudden onset of substernal chest pain with shortness of breath 10  out of 10 improved to a 5/10 with IV nitro and morphine and he now presents  for repeat cardiac catheterization.   DESCRIPTION OF PROCEDURE:  The patient is brought to cardiac catheterization  laboratory in a fasting nonsedated state. Informed consent was obtained. The  patient was connected to continuous heart rate, pulse oximetry monitoring  and intermittent blood pressure monitoring. The right groin was prepped and  draped in sterile fashion. 1% Xylocaine was used for local anesthesia. Using  modified Seldinger technique a 6-French sheath was placed in the right  femoral artery. Under fluoroscopic guidance a 6-French JL-5 catheter was  placed in left coronary artery. Multiple cine films were taken at 30 degree  RAO and 40 degree LAO views. Catheter was exchanged over a guidewire for 6-  [place] JR-4 catheter which was placed under fluoroscopic guidance in right  coronary artery. Multiple cine films  were taken at 30 degree RAO and 40  degree LAO views. Catheter was then removed over a guidewire. At the end of  procedure all catheters and sheaths were removed. Manual compression was  performed until adequate hemostasis was obtained. The patient was  transferred back to room in stable condition.   RESULTS:  1.  Left main coronary artery is widely patent and bifurcates into left      anterior descending artery and left circumflex artery.  2.  Left anterior descending artery is heavily calcified and gives rise to      first diagonal branch which is widely patent. There is then the presence      of a stent between the first and second diagonals. Just distal to the      end of the stent there is a 30-40% narrowing in the LAD at the takeoff      of the second diagonal which has 70% ostial stenosis but is a small      vessel and LAD then gives rise to a third diagonal which is patent. Just      distal to the takeoff of  the third diagonal there is a 40% eccentric      lesion and then tandem lesions 60 to 70% and 70 to 80% distally.  3.  The left circumflex is widely patent throughout its course giving rise      to two obtuse marginal branches, OM1 and OM2 which are widely patent. It      gives rise to a third obtuse marginal branch which has 50-60% narrowing      in the proximal portion. The ongoing circumflex is widely patent.  4.  The right coronary is widely patent with luminal irregularities      throughout and bifurcates distally into posterior descending artery and      posterior lateral artery both of which are widely patent.   ASSESSMENT:  1.  One-vessel obstructive coronary disease.  2.  Status post PTCA stenting of proximal LAD with stenosis distal to the      stent of 30-40%. There are multiple tandem lesions in the distal LAD      anywhere from 60 to 80% which could intermittently be having vasospasm      causing the patient's chest pain. No obvious etiology other than       coronary vasospasm for the patient's chest pain at this time.  3.  Status post non-Q-wave MI with recurrent chest pain.   PLAN:  Transfer to CCU. IV Integrilin x24 hours, increase Coreg to 6.25  milligrams b.i.d., continue nitroglycerin drip x 24 hours and change to  IMDUR, check a D-dimer to rule out PE, check a 2-D echocardiogram to  evaluate for pericardial effusion and pericarditis, and recheck a chest x-  ray.      Armanda Magic, M.D.  Electronically Signed     TT/MEDQ  D:  06/03/2005  T:  06/03/2005  Job:  [id number]   cc:   C. Duane Lope, M.D.  [address]  [place]  [place] [address]  Fax: [phone]

## 2011-03-25 NOTE — Assessment & Plan Note (Signed)
[facility]                               PULMONARY OFFICE NOTE   [name], [name]                      MRN:          [id number]  DATE:08/15/2006                            DOB:          [birth year]    [name] returns today in followup.  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  Overall, he is  markedly improved with decreased cough and decreased chest congestion  maintaining Tamoxifen 10 mg daily, itraconazole 200 mg b.i.d., prednisone 5  mg daily, Atacand 38 mg daily, aspirin 81 mg daily, Plavix 75 mg daily,  Imdur 60 mg daily, Lasix 40 mg daily, Spiriva daily, Casodex 50 mg daily,  glipizide 5 mg daily, Zetia 10 mg daily, Crestor 20 mg daily, Coreg 25 mg  b.i.d., Januvia 100 mg daily and he is now off Actos.   PHYSICAL EXAMINATION:  VITAL SIGNS:  Temperature 97.9, blood pressure  120/68, pulse 59, saturation 95% on room air.  CHEST:  Clear bilaterally without evidence of wheeze or rhonchi.  CARDIAC:  Regular rate and rhythm without S3.  Normal S1, S2.  ABDOMEN:  Soft, nontender.  EXTREMITIES:  No edema or clubbing.  SKIN:  Clear.  NEUROLOGIC:  Intact.  HEENT:  No jugular venous distention, no lymphadenopathy.  Oropharynx clear.  Neck is supple.   IMPRESSION:  Stable to improved airway function.   PLAN:  Reduce itraconazole to 100 mg twice daily.  Maintain Tamoxifen,  prednisone as currently dosed.  Will see the patient back in 3 months at  which time we will repeat CT scan.       Charlcie Cradle Delford Field, MD, FCCP      PEW/MedQ  DD:  08/15/2006  DT:  08/17/2006  Job #:  [id number]

## 2011-03-25 NOTE — Cardiovascular Report (Signed)
[name], HAROON NO.:  [id number]   MEDICAL RECORD NO.:  [id number]          PATIENT TYPE:  INP   LOCATION:  2920                         FACILITY:  [facility]   PHYSICIAN:  Lyn Records, M.D.   DATE OF BIRTH:  23-Jul-1929   DATE OF PROCEDURE:  06/02/2005  DATE OF DISCHARGE:                              CARDIAC CATHETERIZATION   INDICATION FOR PROCEDURE:  Non-ST elevation anterior myocardial infarction.   PROCEDURE PERFORMED:  Taxus stent implantation in the mid LAD.   DESCRIPTION:  I was consulted by Dr. Mayford Knife for this gentleman who presented  with ongoing chest pain, elevated cardiac markers and in whom the suspicion  of a non-ST elevation myocardial infarction was present. He was brought to  the cath lab where he was found to have a high-grade mid LAD stenosis and  diffuse disease in the LAD beyond this point. There was also moderate  disease in the distal circumflex and some question of lucency in the mid  right coronary. He had a wall motion abnormality that did not completely  match the anatomy with what appeared to be a mid to distal anterior wall  motion abnormality and a distal inferior and inferoapical wall motion  abnormality. After review of the images and discussion with Dr. Mayford Knife, we  decided not to proceed with PCI and to intervene upon the LAD which was the  patient's most severe lesion. It was not felt that intervening upon the  distal LAD would be reasonable as there was diffuse disease over a long  segment.   We used a 6-French #5 left Judkins guide catheter, BMW wire, a 2500 unit  bolus of heparin, and double bolus followed by an infusion of Integrilin. We  predilated the lesion with a 3.0 x 12 mm Maverick balloon and then deployed  a 16 x 3.5 mm Taxus stent to 13 atmospheres. Two balloon inflations were  performed. Balloon inflations that occluded the vessel reproduced the  patient's presenting symptoms. TIMI grade III flow was noted  post procedure.  There was still moderate to moderately severe distal vessel disease beyond  the stent and in the apical portion of the LAD. AngioSeal arteriotomy  closure was performed with success.   CONCLUSION:  Successful culprit stent in the mid left anterior descending  artery with reduction in stenosis from 90% plus to 0% with TIMI grade III  flow. The patient has moderate to moderately severe disease in the LAD  beyond the stent implantation site but flow is brisk.   PLAN:  Plavix therapy, aspirin therapy, Integrilin x18 hours and further  management per Dr. Armanda Magic.      Lyn Records, M.D.  Electronically Signed     HWS/MEDQ  D:  06/02/2005  T:  06/03/2005  Job:  [id number]   cc:   Miguel Aschoff, M.D.  [address], [address]  [place]  [place] [address]  Fax: [phone]   Armanda Magic, M.D.  [address]  [place], [place] [address]  Fax: [phone]   Jamison Neighbor, M.D.  [address]  [place]  [place] [address]  Fax: [phone]   Oley Balm. Sung Amabile, M.D. [facility]  [address]  [place]  [place] [address]

## 2011-03-25 NOTE — Assessment & Plan Note (Signed)
[facility]                               PULMONARY OFFICE NOTE   CHARELS, STAMBAUGH                      MRN:          [id number]  DATE:05/16/2006                            DOB:          1928-12-22    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  The patient states his level of  dyspnea has gotten worse over the last several weeks.  He has no real cough,  does have some postnasal drainage.  He denies any chest pains, trouble when  he bends over.  His other maintenance medicines are correct as reviewed and  listed in the chart.   PHYSICAL EXAMINATION:  VITAL SIGNS:  Temperature 97.8, blood pressure  134/82, pulse 57, saturation 96% in room air.  CHEST:  Diminished breath sounds with prolonged expiratory phase, no wheeze  or rhonchi.  CARDIAC:  Regular rate and rhythm without S3, normal S1, S2.  ABDOMEN:  Soft, nontender.  EXTREMITIES:  No edema or clubbing.  SKIN:  Clear.  NEUROLOGIC:  Intact.   Pulmonary functions obtained today reveal an FEV1 of 1.34, an FVC of 2.87,  FEV1/FVC ratio 47% predicted, total lung capacity at 73% predicted, and  diffusion capacity at 77% predicted.  When compared to the values obtained  in January 2007, there has actually been a reduction in lung capacity from  5.9 to 4.37 L but diffusion capacity has been increased from 13.6 to 15.6.  Air flow function, however, has deteriorated with worsening FEV1 now to  1.34, was 1.51.   IMPRESSION:  Worsening air flow obstruction but improvement in diffusion  capacity, insignificant response to bronchodilator therapy.   IMPRESSION:  Mediastinal fibrosis with worsening pulmonary function.   PLAN:  Re-pulse prednisone at 40 mg a day, taper down by 10 mg  over four  days until he gets to the 20 mg a day dose and hold, and will obtain a  repeat CT scan of the chest to reassess the anatomy of the mediastinal  fibrosis.                                   Charlcie Cradle Delford Field, MD, FCCP   PEW/MedQ  DD:  05/16/2006  DT:  05/17/2006  Job #:  [id number]   cc:   C. Duane Lope, MD

## 2011-03-25 NOTE — Op Note (Signed)
   NAME:  [name], [name] NO.:  [id number]   MEDICAL RECORD NO.:  [id number]                   PATIENT TYPE:  AMB   LOCATION:  ENDO                                 FACILITY:  [facility]   PHYSICIAN:  Danise Edge, M.D.                DATE OF BIRTH:  Jul 31, 1929   DATE OF PROCEDURE:  08/08/2002  DATE OF DISCHARGE:                                 OPERATIVE REPORT   PROCEDURE:  Screening colonoscopy.   PROCEDURE INDICATION:  [name] is a 77 year old male, born January 23, 1929.  [name] is scheduled to undergo his first screening  colonoscopy with polypectomy to prevent colon cancer.  I discussed with Mr.  [name], the complications associated with colonoscopy and polypectomy,  including a 15 per 1,000 risk of bleeding, and 1 per 1,000 risk of colon  perforation requiring surgical repair.  [name] has signed the operative  permit.   MEDICATION ALLERGIES:  None.   CHRONIC MEDICATIONS:  Altace and Glucotrol XL.   PAST MEDICAL HISTORY:  Type 2 diabetes mellitus, hypertension.   ENDOSCOPIST:  Danise Edge, M.D.   PREMEDICATION:  Versed 5 mg, Fentanyl 50 mcg.   ENDOSCOPE:  Olympus pediatric colonoscope.   PROCEDURE:  After obtaining informed consent, [name] was placed in the  left lateral decubitus position.  I administered intravenous Fentanyl and  intravenous Versed to achieve conscious sedation for the procedure.  The  patient's blood pressure, oxygen saturation and cardiac rhythm were  monitored throughout the procedure and documented in the medical record.   Anal inspection was normal.  Digital rectal exam revealed a nonmodular  prostate.  The Olympus pediatric video colonoscope was introduced into the  rectum and easily advanced to the cecum.  Colonic preparation for the exam  today was excellent.   Rectum normal.  Sigmoid colon and descending colon normal.  Stomach flexor normal.  Transverse colon normal.  Hepatic flexure  normal.  Ascending colon normal.  Cecum and ileocecal valve normal.    ASSESSMENT:  Normal screening proctocolonoscopy to the cecum.  No endoscopic  evidence for the presence of colorectal neoplasia.                                               Danise Edge, M.D.    MJ/MEDQ  D:  08/08/2002  T:  08/09/2002  Job:  [id number]   cc:   C. Miguel Aschoff, M.D.  [address]  [place]  [place] [address]  Fax: [phone]

## 2011-03-25 NOTE — Assessment & Plan Note (Signed)
[facility]                             PULMONARY OFFICE NOTE   BERWYN, BIGLEY                      MRN:          [id number]  DATE:01/09/2007                            DOB:          11-Oct-1929    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  He had had a positive  response to tamoxifen, itraconazole, and prednisone pulse.  We then were  able to titrate tamoxifen and itraconazole to off in January.  He is now  down to prednisone 5 mg every-other day.  Repeat CT scan had been  performed in July and showed interval reduction in left lower lobe  lesion; mediastinal fibrosis was stable.  He is currently doing well  with his level of dyspnea; no active shortness of breath or cough or  chest discomfort.   He maintains:  1. Atacand 8 mg daily.  2. Januvia 100 mg daily.  3. Coreg 25 mg b.i.d.  4. Prednisone 5 mg every-other day.  5. Crestor 20 mg daily.  6. Zetia 10 mg daily.  7. Glipizide 5 mg h.s.  8. Casodex daily.  9. Spiriva daily.  10.Lasix 40 mg daily.  11.Imdur 60 mg a day.  12.Plavix 75 mg a day.  13.Aspirin daily.  14.Centrum Silver daily.   EXAMINATION:  VITAL SIGNS:  Temperature is 97, blood pressure 130/68,  pulse 73, saturation 96% on room air.  CHEST:  Showed to be clear without evidence of wheeze or rhonchi.  CARDIAC:  Showed a regular rate and rhythm without S3; normal S1, S2.  ABDOMEN:  Soft, nontender.  EXTREMITIES:  Showed no edema or clubbing.  SKIN:  Clear.   IMPRESSION:  1. That of mediastinal fibrosis, likely due to previous      histoplasmosis, stable at this time.  2. Left lower lobe mass, now resolving; likely inflammatory.   PLAN:  Maintain prednisone 5 mg every-other day and Spiriva daily.  Return this patient back for followup visit in 2 months.     Alex Cradle Delford Field, MD, [facility]  Electronically Signed    PEW/MedQ  DD: 01/09/2007   DT: 01/10/2007  Job #: [id number]   cc:   C. Duane Lope, M.D.

## 2011-05-04 ENCOUNTER — Encounter: Payer: Self-pay | Admitting: Critical Care Medicine

## 2011-05-05 ENCOUNTER — Ambulatory Visit (INDEPENDENT_AMBULATORY_CARE_PROVIDER_SITE_OTHER): Payer: Medicare Other | Admitting: Critical Care Medicine

## 2011-05-05 ENCOUNTER — Encounter: Payer: Self-pay | Admitting: Critical Care Medicine

## 2011-05-05 DIAGNOSIS — J9859 Other diseases of mediastinum, not elsewhere classified: Secondary | ICD-10-CM

## 2011-05-05 NOTE — Progress Notes (Signed)
Subjective:    Patient ID: [name], male    DOB: 1929-01-16, 77 y.o.   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  October 15, 2009 12:00 PM  Sinuses were inflammed and received ABX for same per PCP 1.5 months ago. Since then are now worse. Now cough is sl worse.  Notes some wheeze. Drainage is from the sinues and is post nasal drip. Dyspnea is unchanged.  Only on spiriva and no change with advair. Mucous is yellow with blood.  December 17, 2009 9:56 AM  Still with spells of cough if throat gets dry. Dyspnea is about the same. Spiriva has helped as has dulera. No further sinus infx. Does have pndrip.  April 22, 2010 2:08 PM  Had sinus infection that recurred. Saw PCP Ross last week and rx: doxy 100mg  two times a day x 30days . Thus far is sl better. Still with pndrip and productive cough of thick mucus. Mucus was green, now is milky color. No real chest pain. Pt notes more dyspnea with exertion. Pt notes more wheeze.  No blood. Had sinus infx before and again this spring. Scr elevated with Dr Mayford Knife SCr 1.4 in 2008August 18, 2011 2:14 PM  No further sinus issues since 6/11. Off abx since june and doing ok. Now: not much cough. No real wheeze. Not much mucus. Dyspnea is at baseline.  Pt denies any significant sore throat, nasal congestion or excess secretions, fever, chills, sweats, unintended weight loss, pleurtic or exertional chest pain, orthopnea PND, or leg swelling  Pt denies any increase in rescue therapy over baseline, denies waking up needing it or having any early am or nocturnal exacerbations of coughing/wheezing/or dyspnea.  October 21, 2010 2:25 PM  No major changes since last ov. Pt is doing better. Now not much cough. No chest pain. No real wheeze. Some sinus issues.  Pt denies any significant sore throat, nasal congestion or excess secretions, fever, chills, sweats,  unintended weight loss, pleurtic or exertional chest pain, orthopnea PND, or leg swelling  Pt denies any increase in rescue therapy over baseline, denies waking up needing it or having any early am or nocturnal exacerbations of coughing/wheezing/or dyspnea.   05/05/2011 No change in dyspnea.  Min cough now.  No chest pain.   No real wheeze. Pt denies any significant sore throat, nasal congestion or excess secretions, fever, chills, sweats, unintended weight loss, pleurtic or exertional chest pain, orthopnea PND, or leg swelling Pt denies any increase in rescue therapy over baseline, denies waking up needing it or having any early am or nocturnal exacerbations of coughing/wheezing/or dyspnea. Pt also denies any obvious fluctuation in symptoms with  weather or environmental change or other alleviating or aggravating factors    Review of Systems Constitutional:   No  weight loss, night sweats,  Fevers, chills, fatigue, lassitude. HEENT:   No headaches,  Difficulty swallowing,  Tooth/dental problems,  Sore throat,                No sneezing, itching, ear ache, nasal congestion, post nasal drip,   CV:  No chest pain,  Orthopnea, PND, swelling in lower extremities, anasarca, dizziness, palpitations  GI  No heartburn, indigestion, abdominal pain, nausea, vomiting, diarrhea, change in bowel habits, loss of appetite  Resp: Notes shortness of breath with exertion not at rest.  No excess mucus, no productive cough,  No non-productive cough,  No coughing up of blood.  No change in color of mucus.  No wheezing.  No chest wall deformity  Skin: no rash or lesions.  GU: no dysuria, change in color of urine, no urgency or frequency.  No flank pain.  MS:  No joint pain or swelling.  No decreased range of motion.  No back pain.  Psych:  No change in mood or affect. No depression or anxiety.  No memory loss.     Objective:   Physical Exam Filed Vitals:   05/05/11 1606  BP: 134/78  Pulse: 75  Temp:  98.2 F (36.8 C)  TempSrc: Oral  Height: 5\' 8"  (1.727 m)  Weight: 195 lb (88.451 kg)  SpO2: 96%    Gen: Pleasant, well-nourished, in no distress,  normal affect  ENT: No lesions,  mouth clear,  oropharynx clear, no postnasal drip  Neck: No JVD, no TMG, no carotid bruits  Lungs: No use of accessory muscles, no dullness to percussion, distant BS  Cardiovascular: RRR, heart sounds normal, no murmur or gallops, no peripheral edema  Abdomen: soft and NT, no HSM,  BS normal  Musculoskeletal: No deformities, no cyanosis or clubbing  Neuro: alert, non focal  Skin: Warm, no lesions or rashes        Assessment & Plan:   MEDIASTINAL FIBROSIS Mediastinal fibrosis with lower airway obstruction improved Plan No change in inhaled or maintenance medications. Return in   6 months     Updated Medication List Outpatient Encounter Prescriptions as of 05/05/2011  Medication Sig Dispense Refill  . albuterol (PROAIR HFA) 108 (90 BASE) MCG/ACT inhaler Inhale 2 puffs into the lungs every 6 (six) hours as needed.        Marland Kitchen aspirin 81 MG tablet Take 81 mg by mouth daily.        . bicalutamide (CASODEX) 50 MG tablet Take 50 mg by mouth daily.        . carvedilol (COREG) 25 MG tablet Take 25 mg by mouth 2 (two) times daily with a meal.        . clopidogrel (PLAVIX) 75 MG tablet Take 75 mg by mouth daily.        Marland Kitchen ezetimibe (ZETIA) 10 MG tablet Take 10 mg by mouth daily.        . furosemide (LASIX) 20 MG tablet Take 20 mg by mouth every other day.        Marland Kitchen glipiZIDE (GLUCOTROL) 5 MG tablet Take 5 mg by mouth daily.        . isosorbide mononitrate (IMDUR) 60 MG 24 hr tablet Take 60 mg by mouth daily.        Marland Kitchen Leuprolide Acetate (ELIGARD [place]) Every 6 months       . losartan (COZAAR) 50 MG tablet Take 50 mg by mouth daily.        . Mometasone Furo-Formoterol Fum (DULERA) 200-5 MCG/ACT AERO Inhale 2 puffs into the lungs 2 (two) times daily.        . Multiple Vitamin (MULTIVITAMIN) capsule Take 1  capsule by mouth daily.        . Omega-3 350 MG CAPS Take 2 capsules by mouth 2 (two) times daily.        . rosuvastatin (CRESTOR) 20 MG tablet Take 20 mg by mouth daily.        . sitaGLIPtin (JANUVIA) 100 MG tablet Take 100 mg by mouth daily.        Marland Kitchen tiotropium (SPIRIVA) 18 MCG inhalation  capsule Place 18 mcg into inhaler and inhale daily.

## 2011-05-05 NOTE — Patient Instructions (Signed)
No change in medications. Return in        6 months        

## 2011-05-06 NOTE — Assessment & Plan Note (Signed)
Mediastinal fibrosis with lower airway obstruction improved Plan No change in inhaled or maintenance medications. Return in   6 months

## 2011-12-04 ENCOUNTER — Other Ambulatory Visit: Payer: Self-pay | Admitting: Critical Care Medicine

## 2012-03-03 ENCOUNTER — Other Ambulatory Visit: Payer: Self-pay | Admitting: Critical Care Medicine

## 2012-03-05 ENCOUNTER — Emergency Department (INDEPENDENT_AMBULATORY_CARE_PROVIDER_SITE_OTHER): Payer: Medicare Other

## 2012-03-05 ENCOUNTER — Observation Stay (HOSPITAL_BASED_OUTPATIENT_CLINIC_OR_DEPARTMENT_OTHER)
Admission: EM | Admit: 2012-03-05 | Discharge: 2012-03-09 | Disposition: A | Discharge status: Home / Self Care | Payer: Medicare Other | Attending: Internal Medicine | Admitting: Internal Medicine

## 2012-03-05 ENCOUNTER — Encounter (HOSPITAL_BASED_OUTPATIENT_CLINIC_OR_DEPARTMENT_OTHER): Payer: Self-pay | Admitting: *Deleted

## 2012-03-05 DIAGNOSIS — G319 Degenerative disease of nervous system, unspecified: Secondary | ICD-10-CM | POA: Insufficient documentation

## 2012-03-05 DIAGNOSIS — Z8546 Personal history of malignant neoplasm of prostate: Secondary | ICD-10-CM | POA: Insufficient documentation

## 2012-03-05 DIAGNOSIS — J449 Chronic obstructive pulmonary disease, unspecified: Secondary | ICD-10-CM | POA: Insufficient documentation

## 2012-03-05 DIAGNOSIS — I5032 Chronic diastolic (congestive) heart failure: Secondary | ICD-10-CM | POA: Insufficient documentation

## 2012-03-05 DIAGNOSIS — K219 Gastro-esophageal reflux disease without esophagitis: Secondary | ICD-10-CM | POA: Diagnosis present

## 2012-03-05 DIAGNOSIS — I440 Atrioventricular block, first degree: Secondary | ICD-10-CM | POA: Insufficient documentation

## 2012-03-05 DIAGNOSIS — I10 Essential (primary) hypertension: Secondary | ICD-10-CM | POA: Diagnosis present

## 2012-03-05 DIAGNOSIS — R5381 Other malaise: Secondary | ICD-10-CM

## 2012-03-05 DIAGNOSIS — I2510 Atherosclerotic heart disease of native coronary artery without angina pectoris: Secondary | ICD-10-CM | POA: Insufficient documentation

## 2012-03-05 DIAGNOSIS — J9859 Other diseases of mediastinum, not elsewhere classified: Secondary | ICD-10-CM | POA: Diagnosis present

## 2012-03-05 DIAGNOSIS — IMO0002 Reserved for concepts with insufficient information to code with codable children: Secondary | ICD-10-CM | POA: Diagnosis present

## 2012-03-05 DIAGNOSIS — E1165 Type 2 diabetes mellitus with hyperglycemia: Secondary | ICD-10-CM | POA: Diagnosis present

## 2012-03-05 DIAGNOSIS — I509 Heart failure, unspecified: Secondary | ICD-10-CM | POA: Insufficient documentation

## 2012-03-05 DIAGNOSIS — J189 Pneumonia, unspecified organism: Secondary | ICD-10-CM

## 2012-03-05 DIAGNOSIS — E119 Type 2 diabetes mellitus without complications: Secondary | ICD-10-CM | POA: Insufficient documentation

## 2012-03-05 DIAGNOSIS — R5383 Other fatigue: Secondary | ICD-10-CM

## 2012-03-05 DIAGNOSIS — E860 Dehydration: Principal | ICD-10-CM | POA: Diagnosis present

## 2012-03-05 DIAGNOSIS — J4489 Other specified chronic obstructive pulmonary disease: Secondary | ICD-10-CM | POA: Insufficient documentation

## 2012-03-05 DIAGNOSIS — R609 Edema, unspecified: Secondary | ICD-10-CM | POA: Insufficient documentation

## 2012-03-05 DIAGNOSIS — J8410 Pulmonary fibrosis, unspecified: Secondary | ICD-10-CM | POA: Insufficient documentation

## 2012-03-05 DIAGNOSIS — R531 Weakness: Secondary | ICD-10-CM | POA: Diagnosis present

## 2012-03-05 DIAGNOSIS — I428 Other cardiomyopathies: Secondary | ICD-10-CM | POA: Diagnosis present

## 2012-03-05 HISTORY — DX: Chronic obstructive pulmonary disease, unspecified: J44.9

## 2012-03-05 LAB — BASIC METABOLIC PANEL
BUN: 26 mg/dL — ABNORMAL HIGH (ref 6–23)
Chloride: 107 mEq/L (ref 96–112)
GFR calc Af Amer: 71 mL/min — ABNORMAL LOW (ref >90)
GFR calc non Af Amer: 61 mL/min — ABNORMAL LOW (ref >90)
Potassium: 3.8 mEq/L (ref 3.5–5.1)
Sodium: 142 mEq/L (ref 135–145)

## 2012-03-05 LAB — CBC
Hemoglobin: 11 g/dL — ABNORMAL LOW (ref 13.0–17.0)
MCHC: 35.4 g/dL (ref 30.0–36.0)
Platelets: 216 10*3/uL (ref 150–400)
RBC: 3.31 MIL/uL — ABNORMAL LOW (ref 4.22–5.81)

## 2012-03-05 LAB — URINALYSIS, ROUTINE W REFLEX MICROSCOPIC
Leukocytes, UA: NEGATIVE
Nitrite: NEGATIVE
Specific Gravity, Urine: 1.021 (ref 1.005–1.030)
Urobilinogen, UA: 0.2 mg/dL (ref 0.0–1.0)
pH: 5 (ref 5.0–8.0)

## 2012-03-05 LAB — DIFFERENTIAL
Basophils Absolute: 0 10*3/uL (ref 0.0–0.1)
Basophils Relative: 0 % (ref 0–1)
Monocytes Relative: 8 % (ref 3–12)
Neutro Abs: 8.5 10*3/uL — ABNORMAL HIGH (ref 1.7–7.7)
Neutrophils Relative %: 90 % — ABNORMAL HIGH (ref 43–77)

## 2012-03-05 LAB — URINE MICROSCOPIC-ADD ON

## 2012-03-05 LAB — TROPONIN I: Troponin I: <0.3 ng/mL (ref <0.30)

## 2012-03-05 LAB — CK: Total CK: 166 U/L (ref 7–232)

## 2012-03-05 MED ORDER — SODIUM CHLORIDE 0.9 % IV BOLUS (SEPSIS)
500.0000 mL | Freq: Once | INTRAVENOUS | Status: AC
  Administered 2012-03-06: 500 mL via INTRAVENOUS

## 2012-03-05 NOTE — ED Notes (Addendum)
Unable to stand per pt.Pt sts was in BR from this am until now

## 2012-03-05 NOTE — ED Provider Notes (Signed)
History   This chart was scribed for Alex Gaskins, MD by [name]. The patient was seen in room MH04/MH04 and the patient's care was started at 11:00PM.    CSN: [id number]  Arrival date & time 03/05/12  2131   First MD Initiated Contact with Patient 03/05/12 2257      Chief Complaint  Patient presents with  . Weakness     HPI [name] is a 78 y.o. male who presents to the Emergency Department complaining of constant, moderate to severe generalized body weakness with an onset this morning. Wife has been out of town and is on a plane to the ED. Neighbor presented pt to the ED. Pt couldn't get off the commode; sat there from 9AM-8PM. Pt states that he feels tired. Chronic extremity edema. No ambulation. No falls. No LOC or syncope. No HA, fever, neck pain, sore throat, rash, back pain, CP, SOB, abd pain, n/v/d, dysuria, hematuria, incontinence, or extremity pain, numbness, or tingling. No Hx of stroke. No known allergies.  Course is worsening Improved with rest Worsened by - movement  PCP: Dr. Tenny Craw in [place]    Past Medical History  Diagnosis Date  . Coronary atherosclerosis of unspecified type of vessel, native or graft   . Malignant neoplasm of prostate   . Other primary cardiomyopathies   . Other diseases of mediastinum, not elsewhere classified   . Esophageal reflux   . Type II or unspecified type diabetes mellitus without mention of complication, not stated as uncontrolled   . Unspecified essential hypertension   . Kidney disease   . COPD (chronic obstructive pulmonary disease)     Past Surgical History  Procedure Date  . Appendectomy     Family History  Problem Relation Age of Onset  . COPD Brother   . Pancreatic cancer Sister   . Heart disease Father     History  Substance Use Topics  . Smoking status: Not on file  . Smokeless tobacco: Never Used  . Alcohol Use: Yes      Review of Systems 10 Systems reviewed and all are negative  for acute change except as noted in the HPI.   Allergies  Pioglitazone and Ramipril  Home Medications   Current Outpatient Rx  Name Route Sig Dispense Refill  . ALBUTEROL SULFATE HFA 108 (90 BASE) MCG/ACT IN AERS Inhalation Inhale 2 puffs into the lungs every 6 (six) hours as needed.      . ASPIRIN 81 MG PO TABS Oral Take 81 mg by mouth daily.      Marland Kitchen BICALUTAMIDE 50 MG PO TABS Oral Take 50 mg by mouth daily.      Marland Kitchen CARVEDILOL 25 MG PO TABS Oral Take 25 mg by mouth 2 (two) times daily with a meal.      . CLOPIDOGREL BISULFATE 75 MG PO TABS Oral Take 75 mg by mouth daily.      . DULERA 200-5 MCG/ACT IN AERO  INHALE 2 PUFFS TWICE PER DAY 3 Inhaler 0  . EZETIMIBE 10 MG PO TABS Oral Take 10 mg by mouth daily.      . FUROSEMIDE 20 MG PO TABS Oral Take 20 mg by mouth every other day.      Marland Kitchen GLIPIZIDE 5 MG PO TABS Oral Take 5 mg by mouth daily.      . ISOSORBIDE MONONITRATE ER 60 MG PO TB24 Oral Take 60 mg by mouth daily.      Marland Kitchen ELIGARD [place]  Every 6 months     . LOSARTAN POTASSIUM 50 MG PO TABS Oral Take 50 mg by mouth daily.      . MULTIVITAMINS PO CAPS Oral Take 1 capsule by mouth daily.      . OMEGA-3 350 MG PO CAPS Oral Take 2 capsules by mouth 2 (two) times daily.      Marland Kitchen ROSUVASTATIN CALCIUM 20 MG PO TABS Oral Take 20 mg by mouth daily.      Marland Kitchen SITAGLIPTIN PHOSPHATE 100 MG PO TABS Oral Take 100 mg by mouth daily.      Marland Kitchen TIOTROPIUM BROMIDE MONOHYDRATE 18 MCG IN CAPS Inhalation Place 18 mcg into inhaler and inhale daily.        BP 180/72  Pulse 75  Temp(Src) 97.8 F (36.6 C) (Oral)  Resp 18  Ht 5\' 8"  (1.727 m)  Wt 180 lb (81.647 kg)  BMI 27.37 kg/m2  SpO2 96%  Physical Exam  Nursing note and vitals reviewed. Constitutional:       Awake, alert, nontoxic appearance with baseline speech for patient.  HENT:  Head: Atraumatic.  Mouth/Throat: No oropharyngeal exudate.  Eyes: EOM are normal. Pupils are equal, round, and reactive to light. Right eye exhibits no discharge. Left eye  exhibits no discharge.  Neck: Neck supple.  Cardiovascular: Normal rate and regular rhythm.   No murmur heard. Pulmonary/Chest: Effort normal and breath sounds normal. No stridor. No respiratory distress. He has no wheezes. He has no rales. He exhibits no tenderness.  Abdominal: Soft. Bowel sounds are normal. He exhibits no mass. There is no tenderness. There is no rebound.  Musculoskeletal: He exhibits edema (pitting edema symmetrically on lower extermities). He exhibits no tenderness.       Baseline ROM, moves extremities with no obvious new focal weakness.  Lymphadenopathy:    He has no cervical adenopathy.  Neurological:       Awake, alert, cooperative and aware of situation; motor strength bilaterally; sensation normal to light touch bilaterally;no facial asymmetry; tongue midline; no pronator drift.  Skin: No rash noted. There is erythema (Buttocks: erythema to skin).  Psychiatric: He has a normal mood and affect.    ED Course  Procedures   DIAGNOSTIC STUDIES: Oxygen Saturation is 96% on room air, adeuquate by my interpretation.    COORDINATION OF CARE:  11:07PM - EDMD will order head CAT and UA for the pt. 1:02 AM Pt with generalized weakness, unable to ambulate at home (though no obvious motor deficits in the bed) Dehydration also noted as well as pneumonia Will admit to hospital Pt agreeable D/w dr Kaylyn Layer will admit for pneumonia/weakness/dehydration.  No focal weakness noted, doubt CVA or acute neurologic process at this time Labs Reviewed  CBC - Abnormal; Notable for the following:    RBC 3.31 (*)    Hemoglobin 11.0 (*)    HCT 31.1 (*)    All other components within normal limits  DIFFERENTIAL - Abnormal; Notable for the following:    Neutrophils Relative 90 (*)    Neutro Abs 8.5 (*)    Lymphocytes Relative 2 (*)    Lymphs Abs 0.2 (*)    All other components within normal limits  BASIC METABOLIC PANEL - Abnormal; Notable for the following:    Glucose, Bld 265 (*)     BUN 26 (*)    GFR calc non Af Amer 61 (*)    GFR calc Af Amer 71 (*)    All other components within normal limits  URINALYSIS,  ROUTINE W REFLEX MICROSCOPIC - Abnormal; Notable for the following:    Color, Urine AMBER (*) BIOCHEMICALS MAY BE AFFECTED BY COLOR   Glucose, UA >1000 (*)    Hgb urine dipstick SMALL (*)    Protein, ur >300 (*)    All other components within normal limits  URINE MICROSCOPIC-ADD ON - Abnormal; Notable for the following:    Bacteria, UA FEW (*)    All other components within normal limits  TROPONIN I  CK     MDM  Nursing notes reviewed and considered in documentation Xrays/ct imaging reviewed and considered All labs/vitals reviewed and considered    I personally performed the services described in this documentation, which was scribed in my presence. The recorded information has been reviewed and considered.          Date: 03/05/2012  Rate: 82  Rhythm: normal sinus rhythm  QRS Axis: normal  Intervals: PR prolonged  ST/T Wave abnormalities: nonspecific ST changes  Conduction Disutrbances:first-degree A-V block   Narrative Interpretation:   Old EKG Reviewed: unchanged    Alex Gaskins, MD 03/06/12 [phone]

## 2012-03-05 NOTE — ED Notes (Signed)
C/o generalized weakness for past few days but sxs have been getting worse. Denies CP or SOB.

## 2012-03-06 ENCOUNTER — Emergency Department (INDEPENDENT_AMBULATORY_CARE_PROVIDER_SITE_OTHER): Payer: Medicare Other

## 2012-03-06 ENCOUNTER — Encounter (HOSPITAL_COMMUNITY): Payer: Self-pay | Admitting: Orthopedic Surgery

## 2012-03-06 DIAGNOSIS — R918 Other nonspecific abnormal finding of lung field: Secondary | ICD-10-CM

## 2012-03-06 DIAGNOSIS — Z9289 Personal history of other medical treatment: Secondary | ICD-10-CM

## 2012-03-06 DIAGNOSIS — R222 Localized swelling, mass and lump, trunk: Secondary | ICD-10-CM

## 2012-03-06 DIAGNOSIS — J90 Pleural effusion, not elsewhere classified: Secondary | ICD-10-CM

## 2012-03-06 DIAGNOSIS — R5381 Other malaise: Secondary | ICD-10-CM

## 2012-03-06 DIAGNOSIS — E860 Dehydration: Principal | ICD-10-CM | POA: Diagnosis present

## 2012-03-06 DIAGNOSIS — R531 Weakness: Secondary | ICD-10-CM | POA: Diagnosis present

## 2012-03-06 HISTORY — DX: Personal history of other medical treatment: Z92.89

## 2012-03-06 LAB — HEMOGLOBIN A1C
Hgb A1c MFr Bld: 5.7 % — ABNORMAL HIGH (ref <5.7)
Mean Plasma Glucose: 117 mg/dL — ABNORMAL HIGH (ref <117)

## 2012-03-06 LAB — GLUCOSE, CAPILLARY
Glucose-Capillary: 119 mg/dL — ABNORMAL HIGH (ref 70–99)
Glucose-Capillary: 209 mg/dL — ABNORMAL HIGH (ref 70–99)

## 2012-03-06 MED ORDER — ASPIRIN 81 MG PO TABS: 81.0000 mg | ORAL_TABLET | Freq: Every day | ORAL | Status: DC

## 2012-03-06 MED ORDER — EZETIMIBE 10 MG PO TABS
10.0000 mg | ORAL_TABLET | Freq: Every day | ORAL | Status: DC
  Administered 2012-03-06 – 2012-03-09 (×4): 10 mg via ORAL
  Filled 2012-03-06 (×4): qty 1

## 2012-03-06 MED ORDER — CLOPIDOGREL BISULFATE 75 MG PO TABS
75.0000 mg | ORAL_TABLET | Freq: Every day | ORAL | Status: DC
  Administered 2012-03-06 – 2012-03-09 (×4): 75 mg via ORAL
  Filled 2012-03-06 (×5): qty 1

## 2012-03-06 MED ORDER — ASPIRIN 81 MG PO CHEW
81.0000 mg | CHEWABLE_TABLET | Freq: Every day | ORAL | Status: DC
  Administered 2012-03-06 – 2012-03-09 (×4): 81 mg via ORAL
  Filled 2012-03-06 (×5): qty 1

## 2012-03-06 MED ORDER — DEXTROSE 5 % IV SOLN
1.0000 g | Freq: Once | INTRAVENOUS | Status: AC
  Administered 2012-03-06: 1 g via INTRAVENOUS
  Filled 2012-03-06: qty 10

## 2012-03-06 MED ORDER — SODIUM CHLORIDE 0.9 % IJ SOLN
3.0000 mL | Freq: Two times a day (BID) | INTRAMUSCULAR | Status: DC
  Administered 2012-03-06 – 2012-03-08 (×3): 3 mL via INTRAVENOUS

## 2012-03-06 MED ORDER — SENNOSIDES-DOCUSATE SODIUM 8.6-50 MG PO TABS
1.0000 | ORAL_TABLET | Freq: Every evening | ORAL | Status: DC | PRN
  Administered 2012-03-06: 1 via ORAL
  Filled 2012-03-06: qty 1

## 2012-03-06 MED ORDER — OMEGA-3-ACID ETHYL ESTERS 1 G PO CAPS
1.0000 g | ORAL_CAPSULE | Freq: Two times a day (BID) | ORAL | Status: DC
  Administered 2012-03-06 – 2012-03-09 (×7): 1 g via ORAL
  Filled 2012-03-06 (×8): qty 1

## 2012-03-06 MED ORDER — MOMETASONE FURO-FORMOTEROL FUM 200-5 MCG/ACT IN AERO: 2.0000 | INHALATION_SPRAY | Freq: Two times a day (BID) | RESPIRATORY_TRACT | Status: DC

## 2012-03-06 MED ORDER — WHITE PETROLATUM GEL
Status: AC
  Administered 2012-03-06: 17:00:00
  Filled 2012-03-06: qty 5

## 2012-03-06 MED ORDER — FLUTICASONE-SALMETEROL 250-50 MCG/DOSE IN AEPB
1.0000 | INHALATION_SPRAY | Freq: Two times a day (BID) | RESPIRATORY_TRACT | Status: DC
  Administered 2012-03-06 – 2012-03-09 (×7): 1 via RESPIRATORY_TRACT
  Filled 2012-03-06: qty 14

## 2012-03-06 MED ORDER — ALBUTEROL SULFATE HFA 108 (90 BASE) MCG/ACT IN AERS
2.0000 | INHALATION_SPRAY | Freq: Four times a day (QID) | RESPIRATORY_TRACT | Status: DC | PRN
  Filled 2012-03-06: qty 6.7

## 2012-03-06 MED ORDER — ENOXAPARIN SODIUM 40 MG/0.4ML ~~LOC~~ SOLN
40.0000 mg | SUBCUTANEOUS | Status: DC
  Administered 2012-03-06 – 2012-03-09 (×4): 40 mg via SUBCUTANEOUS
  Filled 2012-03-06 (×4): qty 0.4

## 2012-03-06 MED ORDER — DEXTROSE 5 % IV SOLN
500.0000 mg | Freq: Once | INTRAVENOUS | Status: AC
  Administered 2012-03-06: 500 mg via INTRAVENOUS
  Filled 2012-03-06: qty 500

## 2012-03-06 MED ORDER — GLIPIZIDE 5 MG PO TABS
5.0000 mg | ORAL_TABLET | Freq: Every day | ORAL | Status: DC
  Administered 2012-03-07 – 2012-03-09 (×3): 5 mg via ORAL
  Filled 2012-03-06 (×7): qty 1

## 2012-03-06 MED ORDER — SODIUM CHLORIDE 0.9 % IJ SOLN: 3.0000 mL | INTRAMUSCULAR | Status: DC | PRN

## 2012-03-06 MED ORDER — BICALUTAMIDE 50 MG PO TABS
50.0000 mg | ORAL_TABLET | Freq: Every day | ORAL | Status: DC
  Administered 2012-03-06 – 2012-03-09 (×4): 50 mg via ORAL
  Filled 2012-03-06 (×4): qty 1

## 2012-03-06 MED ORDER — MULTIVITAMINS PO CAPS: 1.0000 | ORAL_CAPSULE | Freq: Every day | ORAL | Status: DC

## 2012-03-06 MED ORDER — TIOTROPIUM BROMIDE MONOHYDRATE 18 MCG IN CAPS
18.0000 ug | ORAL_CAPSULE | Freq: Every day | RESPIRATORY_TRACT | Status: DC
  Administered 2012-03-06 – 2012-03-09 (×4): 18 ug via RESPIRATORY_TRACT
  Filled 2012-03-06: qty 5

## 2012-03-06 MED ORDER — CARVEDILOL 25 MG PO TABS
25.0000 mg | ORAL_TABLET | Freq: Two times a day (BID) | ORAL | Status: DC
  Administered 2012-03-06 – 2012-03-09 (×7): 25 mg via ORAL
  Filled 2012-03-06 (×9): qty 1

## 2012-03-06 MED ORDER — ADULT MULTIVITAMIN W/MINERALS CH
1.0000 | ORAL_TABLET | Freq: Every day | ORAL | Status: DC
  Administered 2012-03-06 – 2012-03-09 (×3): 1 via ORAL
  Filled 2012-03-06 (×5): qty 1

## 2012-03-06 MED ORDER — ISOSORBIDE MONONITRATE ER 60 MG PO TB24
60.0000 mg | ORAL_TABLET | Freq: Every day | ORAL | Status: DC
  Administered 2012-03-06 – 2012-03-09 (×4): 60 mg via ORAL
  Filled 2012-03-06 (×4): qty 1

## 2012-03-06 MED ORDER — LINAGLIPTIN 5 MG PO TABS
5.0000 mg | ORAL_TABLET | Freq: Every day | ORAL | Status: DC
  Administered 2012-03-06 – 2012-03-09 (×4): 5 mg via ORAL
  Filled 2012-03-06 (×4): qty 1

## 2012-03-06 MED ORDER — INSULIN ASPART 100 UNIT/ML ~~LOC~~ SOLN
0.0000 [IU] | Freq: Three times a day (TID) | SUBCUTANEOUS | Status: DC
  Administered 2012-03-06: 8 [IU] via SUBCUTANEOUS
  Administered 2012-03-07: 3 [IU] via SUBCUTANEOUS
  Administered 2012-03-07: 5 [IU] via SUBCUTANEOUS
  Administered 2012-03-08: 2 [IU] via SUBCUTANEOUS
  Administered 2012-03-08: 5 [IU] via SUBCUTANEOUS
  Administered 2012-03-08 – 2012-03-09 (×2): 2 [IU] via SUBCUTANEOUS
  Administered 2012-03-09: 5 [IU] via SUBCUTANEOUS

## 2012-03-06 MED ORDER — SODIUM CHLORIDE 0.9 % IV SOLN: INTRAVENOUS | Status: DC

## 2012-03-06 MED ORDER — SODIUM CHLORIDE 0.9 % IV SOLN: 250.0000 mL | INTRAVENOUS | Status: DC | PRN

## 2012-03-06 MED ORDER — OMEGA-3 350 MG PO CAPS: 2.0000 | ORAL_CAPSULE | Freq: Two times a day (BID) | ORAL | Status: DC

## 2012-03-06 MED ORDER — LOSARTAN POTASSIUM 50 MG PO TABS
50.0000 mg | ORAL_TABLET | Freq: Every day | ORAL | Status: DC
  Administered 2012-03-06 – 2012-03-09 (×4): 50 mg via ORAL
  Filled 2012-03-06 (×4): qty 1

## 2012-03-06 NOTE — Progress Notes (Signed)
Utilization review complete 

## 2012-03-06 NOTE — Progress Notes (Signed)
  Echocardiogram 2D Echocardiogram has been performed.  [name], Real Cons 03/06/2012, 2:28 PM

## 2012-03-06 NOTE — Evaluation (Signed)
Physical Therapy Evaluation Patient Details Name: [name] MRN: [id number] DOB: 19-Jan-1929 Today's Date: 03/06/2012 Time: 7829-5621 PT Time Calculation (min): 39 min  PT Assessment / Plan / Recommendation Clinical Impression  Pt admitted with general weakness over 5 day period ending with pt unable to get off the toiilet.  Presently pt is weak, but ambulatory with SOB with minimal exertion.  Recomment ST SNF for rehab vs HHPT with initial A/S up to 24 hours    PT Assessment  Patient needs continued PT services    Follow Up Recommendations  Skilled nursing facility;Home health PT;Supervision/Assistance - 24 hour    Equipment Recommendations  Rolling walker with 5" wheels;3 in 1 bedside comode    Frequency Min 3X/week    Precautions / Restrictions Precautions Precautions: Fall Restrictions Weight Bearing Restrictions: No   Pertinent Vitals/Pain      Mobility  Bed Mobility Bed Mobility: Rolling Left;Left Sidelying to Sit Rolling Left: 4: Min guard Left Sidelying to Sit: 4: Min assist Details for Bed Mobility Assistance: vc's for guidance, and best hand placement Transfers Transfers: Sit to Stand;Stand to Sit Sit to Stand: 4: Min guard;With upper extremity assist;From bed Stand to Sit: 4: Min assist;With upper extremity assist;To chair/3-in-1 Details for Transfer Assistance: vc's for hand placement; minor assist for help to come forward over BOS and to control descent Ambulation/Gait Ambulation/Gait Assistance: 4: Min guard Ambulation Distance (Feet): 30 Feet Assistive device: Rolling walker Ambulation/Gait Assistance Details: steady assist and to increase confidence Gait Pattern: Step-through pattern;Decreased step length - right;Decreased step length - left;Decreased stride length Gait velocity: very slow and halted Stairs: No Wheelchair Mobility Wheelchair Mobility: No    Exercises     PT Goals Acute Rehab PT Goals PT Goal Formulation: With patient Time  For Goal Achievement: 03/20/12 Potential to Achieve Goals: Good Pt will go Supine/Side to Sit: with supervision PT Goal: Supine/Side to Sit - Progress: Goal set today Pt will go Sit to Stand: with supervision PT Goal: Sit to Stand - Progress: Goal set today Pt will Transfer Bed to Chair/Chair to Bed: with supervision PT Transfer Goal: Bed to Chair/Chair to Bed - Progress: Goal set today Pt will Ambulate: 51 - 150 feet;with supervision;with least restrictive assistive device;with rolling walker PT Goal: Ambulate - Progress: Goal set today Pt will Go Up / Down Stairs: 3-5 stairs;with supervision;with rail(s) (to be completed if pt to go directly home) PT Goal: Up/Down Stairs - Progress: Goal set today  Visit Information  Last PT Received On: 03/06/12 Assistance Needed: +1    Subjective Data  Subjective: I don't think I'm going to be able to get up. Patient Stated Goal: to get back home at the level I was   Prior Functioning  Home Living Lives With: Spouse Available Help at Discharge: Family Type of Home: House Home Access: Stairs to enter [employer] of Steps: 4 Entrance Stairs-Rails: Left Home Layout: Two level;1/2 bath on main level;Other (Comment) Alternate Level Stairs-Number of Steps: 13 Alternate Level Stairs-Rails: Right Bathroom Shower/Tub: Tub/shower unit;Door [employer] Toilet: Standard Home Adaptive Equipment: Straight cane Prior Function Level of Independence: Independent with assistive device(s) Able to Take Stairs?: Yes Driving: Yes Vocation: Retired Musician: No difficulties    Cognition  Overall Cognitive Status: Appears within functional limits for tasks assessed/performed Arousal/Alertness: Awake/alert Orientation Level: Appears intact for tasks assessed Behavior During Session: [facility] for tasks performed    Extremity/Trunk Assessment Right Upper Extremity Assessment RUE ROM/Strength/Tone: [facility] for tasks assessed RUE  Coordination: [facility] -  gross motor Left Upper Extremity Assessment LUE ROM/Strength/Tone: WFL for tasks assessed LUE Coordination: WFL - gross motor Right Lower Extremity Assessment RLE ROM/Strength/Tone: Within functional levels RLE Coordination: WFL - gross motor Left Lower Extremity Assessment LLE ROM/Strength/Tone: Within functional levels (Generally weak)   Balance Balance Balance Assessed: No  End of Session PT - End of Session Activity Tolerance: Patient tolerated treatment well Patient left: in chair;with call bell/phone within reach Nurse Communication: Mobility status   Ersie Savino, Eliseo Gum 03/06/2012, 12:34 PM  03/06/2012  Leona Bing, PT [phone] (pager)

## 2012-03-06 NOTE — H&P (Addendum)
PCP:  Daisy Floro, MD, MD   DOA:  03/05/2012  9:37 PM  Chief Complaint:  Generalized Weakness x 1 day  HPI: 78 y/o male with hx of CAD, CHF, DM, GERD, prostate ca, mediastinal fibrosis presents with 1 day of generalized weakness. He was sitting on his commode yesterday morning but could not get up due to weakness for several hours ( almost 12 hrs and shouted for help). he informs feeling weak for past several days and  also slightly more short of breath and poor appetite.  He denies any fever, worsening cough, chills, muscle pains, chest pain , palpitations, headache or dizziness. No abdominal pain, diarrhea or urinary symptoms.  In the high pt ED he was noted to be weak and dehydrated and triad called for admission for observation.    Allergies: Allergies  Allergen Reactions  . Pioglitazone     REACTION: cough  . Ramipril     Prior to Admission medications   Medication Sig Start Date End Date Taking? Authorizing Provider  albuterol (PROAIR HFA) 108 (90 BASE) MCG/ACT inhaler Inhale 2 puffs into the lungs every 6 (six) hours as needed.     Yes Historical Provider, MD  aspirin 81 MG tablet Take 81 mg by mouth daily.     Yes Historical Provider, MD  bicalutamide (CASODEX) 50 MG tablet Take 50 mg by mouth daily.     Yes Historical Provider, MD  carvedilol (COREG) 25 MG tablet Take 25 mg by mouth 2 (two) times daily with a meal.     Yes Historical Provider, MD  clopidogrel (PLAVIX) 75 MG tablet Take 75 mg by mouth daily.     Yes Historical Provider, MD  DULERA 200-5 MCG/ACT AERO INHALE 2 PUFFS TWICE PER DAY 12/04/11  Yes Storm Frisk, MD  ezetimibe (ZETIA) 10 MG tablet Take 10 mg by mouth daily.     Yes Historical Provider, MD  furosemide (LASIX) 20 MG tablet Take 20 mg by mouth every other day.     Yes Historical Provider, MD  glipiZIDE (GLUCOTROL) 5 MG tablet Take 5 mg by mouth daily.     Yes Historical Provider, MD  isosorbide mononitrate (IMDUR) 60 MG 24 hr tablet Take 60 mg  by mouth daily.     Yes Historical Provider, MD  Leuprolide Acetate (ELIGARD [place]) Every 6 months    Yes Historical Provider, MD  losartan (COZAAR) 50 MG tablet Take 50 mg by mouth daily.     Yes Historical Provider, MD  Multiple Vitamin (MULTIVITAMIN) capsule Take 1 capsule by mouth daily.     Yes Historical Provider, MD  Omega-3 350 MG CAPS Take 2 capsules by mouth 2 (two) times daily.     Yes Historical Provider, MD  rosuvastatin (CRESTOR) 20 MG tablet Take 20 mg by mouth daily.     Yes Historical Provider, MD  sitaGLIPtin (JANUVIA) 100 MG tablet Take 100 mg by mouth daily.     Yes Historical Provider, MD  tiotropium (SPIRIVA) 18 MCG inhalation capsule Place 18 mcg into inhaler and inhale daily.     Yes Historical Provider, MD    Past Medical History  Diagnosis Date  . Coronary atherosclerosis of unspecified type of vessel, native or graft   . Malignant neoplasm of prostate   . Other primary cardiomyopathies   . Other diseases of mediastinum, not elsewhere classified   . Esophageal reflux   . Type II or unspecified type diabetes mellitus without mention of complication, not stated as uncontrolled   .  Unspecified essential hypertension   . Kidney disease   . COPD (chronic obstructive pulmonary disease)     Past Surgical History  Procedure Date  . Appendectomy     Social History:  does not have a smoking history on file. He has never used smokeless tobacco. He reports that he drinks alcohol. He reports that he does not use illicit drugs.  Family History  Problem Relation Age of Onset  . COPD Brother   . Pancreatic cancer Sister   . Heart disease Father     Review of Systems:  Constitutional: Denies fever, chills, diaphoresis, appetite change, fatigue. ++ HEENT: Denies photophobia, eye pain, redness, hearing loss, ear pain, congestion, sore throat, rhinorrhea, sneezing, mouth sores, trouble swallowing, neck pain, neck stiffness and tinnitus.   Respiratory: Denies worsening SOB,  DOE, cough, chest tightness,  and wheezing.   Cardiovascular: Denies chest pain, palpitations and leg swelling.  Gastrointestinal: Denies nausea, vomiting, abdominal pain, diarrhea, constipation, blood in stool and abdominal distention.  Genitourinary: Denies dysuria, urgency, frequency, hematuria, flank pain and difficulty urinating.  Musculoskeletal: Denies myalgias, back pain, joint swelling, arthralgias and gait problem. generalized weakness  Skin: Denies pallor, rash and wound.  Neurological: Denies dizziness, seizures, syncope, weakness, light-headedness, numbness and headaches.  Hematological: Denies adenopathy. Easy bruising, personal or family bleeding history  Psychiatric/Behavioral: Denies suicidal ideation, mood changes, confusion, nervousness, sleep disturbance and agitation   Physical Exam:  Filed Vitals:   03/05/12 2211 03/06/12 0235 03/06/12 0348 03/06/12 0612  BP: 180/72 154/64 171/67 160/61  Pulse: 75 75 79 64  Temp:  98 F (36.7 C) 97.8 F (36.6 C) 97.4 F (36.3 C)  TempSrc:  Oral Oral   Resp:   16 18  Height:   5\' 8"  (1.727 m)   Weight:   88.9 kg (195 lb 15.8 oz)   SpO2:  97% 96% 98%    Constitutional: Vital signs reviewed.  Patient is a well-developed and well-nourished in no acute distress and cooperative with exam. Alert and oriented x3.  Head: Normocephalic and atraumatic Ear: TM normal bilaterally Mouth: no erythema or exudates, MMM Eyes: PERRL, EOMI, conjunctivae normal, No scleral icterus.  Neck: Supple, Trachea midline normal ROM, No JVD, mass, thyromegaly, or carotid bruit present.  Cardiovascular: RRR, S1 normal, S2 normal, no MRG, pulses symmetric and intact bilaterally Pulmonary/Chest: b/l equal air entry, expiratory rhonchi diffusely,   Abdominal: Soft. Non-tender, non-distended, bowel sounds are normal, no masses, organomegaly, or guarding present.  GU: no CVA tenderness Musculoskeletal: No joint deformities, erythema, or stiffness, ROM full and  no non tender, unable to sit up without assistance Ext: 2+ pitting edema,and no cyanosis, pulses palpable bilaterally (DP and PT) Hematology: no cervical, inginal, or axillary adenopathy.  Neurological: A&O x3, Strenght is normal and symmetric bilaterally, ( however is unable to sit up without assistance)  cranial nerve II-XII are grossly intact, no focal motor deficit, sensory intact to light touch bilaterally.  Skin: Warm, dry and intact. No rash, cyanosis, or clubbing.  Psychiatric: Normal mood and affect. speech and behavior is normal. Judgment and thought content normal. Cognition and memory are normal.   Labs on Admission:  Results for orders placed during the hospital encounter of 03/05/12 (from the past 48 hour(s))  CBC     Status: Abnormal   Collection Time   03/05/12 11:15 PM      Component Value Range Comment   WBC 9.5  4.0 - 10.5 (K/uL)    RBC 3.31 (*) 4.22 - 5.81 (  MIL/uL)    Hemoglobin 11.0 (*) 13.0 - 17.0 (g/dL)    HCT 81.1 (*) 91.4 - 52.0 (%)    MCV 94.0  78.0 - 100.0 (fL)    MCH 33.2  26.0 - 34.0 (pg)    MCHC 35.4  30.0 - 36.0 (g/dL)    RDW 78.2  95.6 - 21.3 (%)    Platelets 216  150 - 400 (K/uL)   DIFFERENTIAL     Status: Abnormal   Collection Time   03/05/12 11:15 PM      Component Value Range Comment   Neutrophils Relative 90 (*) 43 - 77 (%)    Neutro Abs 8.5 (*) 1.7 - 7.7 (K/uL)    Lymphocytes Relative 2 (*) 12 - 46 (%)    Lymphs Abs 0.2 (*) 0.7 - 4.0 (K/uL)    Monocytes Relative 8  3 - 12 (%)    Monocytes Absolute 0.7  0.1 - 1.0 (K/uL)    Eosinophils Relative 0  0 - 5 (%)    Eosinophils Absolute 0.0  0.0 - 0.7 (K/uL)    Basophils Relative 0  0 - 1 (%)    Basophils Absolute 0.0  0.0 - 0.1 (K/uL)   BASIC METABOLIC PANEL     Status: Abnormal   Collection Time   03/05/12 11:15 PM      Component Value Range Comment   Sodium 142  135 - 145 (mEq/L)    Potassium 3.8  3.5 - 5.1 (mEq/L)    Chloride 107  96 - 112 (mEq/L)    CO2 23  19 - 32 (mEq/L)    Glucose, Bld  265 (*) 70 - 99 (mg/dL)    BUN 26 (*) 6 - 23 (mg/dL)    Creatinine, Ser 0.86  0.50 - 1.35 (mg/dL)    Calcium 9.9  8.4 - 10.5 (mg/dL)    GFR calc non Af Amer 61 (*) >90 (mL/min)    GFR calc Af Amer 71 (*) >90 (mL/min)   URINALYSIS, ROUTINE W REFLEX MICROSCOPIC     Status: Abnormal   Collection Time   03/05/12 11:15 PM      Component Value Range Comment   Color, Urine AMBER (*) YELLOW  BIOCHEMICALS MAY BE AFFECTED BY COLOR   APPearance CLEAR  CLEAR     Specific Gravity, Urine 1.021  1.005 - 1.030     pH 5.0  5.0 - 8.0     Glucose, UA >1000 (*) NEGATIVE (mg/dL)    Hgb urine dipstick SMALL (*) NEGATIVE     Bilirubin Urine NEGATIVE  NEGATIVE     Ketones, ur NEGATIVE  NEGATIVE (mg/dL)    Protein, ur >578 (*) NEGATIVE (mg/dL)    Urobilinogen, UA 0.2  0.0 - 1.0 (mg/dL)    Nitrite NEGATIVE  NEGATIVE     Leukocytes, UA NEGATIVE  NEGATIVE    TROPONIN I     Status: Normal   Collection Time   03/05/12 11:15 PM      Component Value Range Comment   Troponin I <0.30  <0.30 (ng/mL)   CK     Status: Normal   Collection Time   03/05/12 11:15 PM      Component Value Range Comment   Total CK 166  7 - 232 (U/L)   URINE MICROSCOPIC-ADD ON     Status: Abnormal   Collection Time   03/05/12 11:15 PM      Component Value Range Comment   Squamous Epithelial / LPF RARE  RARE  WBC, UA 0-2  <3 (WBC/hpf)    RBC / HPF 0-2  <3 (RBC/hpf)    Bacteria, UA FEW (*) RARE     Urine-Other MUCOUS PRESENT       Radiological Exams on Admission: *RADIOLOGY REPORT*  Clinical Data: Weakness  PORTABLE CHEST - 1 VIEW  Comparison: 09/12/2010  Findings: Blunted costophrenic angles, similar to prior. Right  lower lobe opacity. Bilateral infrahilar masses are similar to  prior and described as fibrosis on comparison CT. Cardiomegaly. No  pneumothorax. No definite acute osseous abnormality.  IMPRESSION:  Bilateral infrahilar/paramediastinal masses are similar to prior,  previously characterized as fibrosis.    Bibasilar opacities and small pleural effusions; atelectasis versus  infiltrate.   Assessment/   78 y/o male with hx of CAD, cardiomyopathy, DM , mediastinal fibrosis presents with weakness.   Plan   *Weakness and dehydration Admit under obs - weakness associated with some dehydration.mild elevation in BUN  Will hold lasix Get PT/OT  hold statin for now with concern for proximal myopathy although denies pain and has normal CPK CXR comments on possible lower infiltrate but clinically no signs or symptoms of PNA so will not treat    DIABETES, TYPE 2 Cont fsg monitoring and SSI, cont home meds   HYPERTENSION Stable  cont home meds except lasix   CAD  stable , cont home meds   Cardiomyopathy Has some leg swellings which seems to be baseline. Last Echo in system several years back with low EF. Will check pro BNP and repeat echo as patient complains of ongoing SOB and leg swellings.    Mediastinal fibrosis Cont home inhalers Seems at baseline F/up as outpatient  DVT prophylaxis  Diabetic diet   Full code  Time Spent on Admission: 55 minutes  Madoline Bhatt 03/06/2012, 8:37 AM

## 2012-03-06 NOTE — Progress Notes (Signed)
   CARE MANAGEMENT NOTE 03/06/2012  Patient:  [name], [name]   Account Number:  [id number]  Date Initiated:  03/06/2012  Documentation initiated by:  Onnie Boer  Subjective/Objective Assessment:   PT WAS ADMITTED WITH WEAKNESS     Action/Plan:   PROGRESSION OF CARE AND DISCHARGE PLANNING   Anticipated DC Date:  03/09/2012   Anticipated DC Plan:  SKILLED NURSING FACILITY  In-house referral  Clinical Social Worker      DC Planning Services  CM consult      Choice offered to / List presented to:             Status of service:  In process, will continue to follow Medicare Important Message given?   (If response is "NO", the following Medicare IM given date fields will be blank) Date Medicare IM given:   Date Additional Medicare IM given:    Discharge Disposition:    Per UR Regulation:  Reviewed for med. necessity/level of care/duration of stay  If discussed at Long Length of Stay Meetings, dates discussed:    Comments:  03/06/12 Onnie Boer, RN, BSN 1612 PTA PT WAS AT HOME WITH HIS WIFE AND HAS BEEN HAVING SOB FOR A WHILE AND WEAKNESS FOR A WEEK OR SO.  PT WIFE WORKS 8 HRS A DAY.  PT CURRENTLY WORKING WITH HIM AND HE IS ABLE TO STAND AND WALK TO THE DOOR.  PT IS UNDECIDED ABOUT DISPOSITION AS WE NEED MORE INFORMATION FROM WIFE AS SHE HAS STATED THAT SHE CAN BE OFF WORK FOR A WHILE.  WILL F/U IN THE AM.

## 2012-03-07 ENCOUNTER — Encounter (HOSPITAL_COMMUNITY): Payer: Self-pay | Admitting: Orthopedic Surgery

## 2012-03-07 DIAGNOSIS — E860 Dehydration: Secondary | ICD-10-CM

## 2012-03-07 DIAGNOSIS — R5381 Other malaise: Secondary | ICD-10-CM

## 2012-03-07 DIAGNOSIS — R5383 Other fatigue: Secondary | ICD-10-CM

## 2012-03-07 LAB — GLUCOSE, CAPILLARY
Glucose-Capillary: 180 mg/dL — ABNORMAL HIGH (ref 70–99)
Glucose-Capillary: 221 mg/dL — ABNORMAL HIGH (ref 70–99)

## 2012-03-07 LAB — BASIC METABOLIC PANEL
Chloride: 107 mEq/L (ref 96–112)
GFR calc Af Amer: 76 mL/min — ABNORMAL LOW (ref >90)
Potassium: 3.9 mEq/L (ref 3.5–5.1)

## 2012-03-07 MED ORDER — FUROSEMIDE 20 MG PO TABS
20.0000 mg | ORAL_TABLET | ORAL | Status: DC
  Administered 2012-03-07: 20 mg via ORAL
  Filled 2012-03-07: qty 1

## 2012-03-07 NOTE — Progress Notes (Signed)
   CARE MANAGEMENT NOTE 03/07/2012  Patient:  [name], [name]   Account Number:  [id number]  Date Initiated:  03/06/2012  Documentation initiated by:  Onnie Boer  Subjective/Objective Assessment:   PT WAS ADMITTED WITH WEAKNESS     Action/Plan:   PROGRESSION OF CARE AND DISCHARGE PLANNING   Anticipated DC Date:  03/09/2012   Anticipated DC Plan:  SKILLED NURSING FACILITY  In-house referral  Clinical Social Worker      DC Planning Services  CM consult      Choice offered to / List presented to:             Status of service:  In process, will continue to follow Medicare Important Message given?   (If response is "NO", the following Medicare IM given date fields will be blank) Date Medicare IM given:   Date Additional Medicare IM given:    Discharge Disposition:    Per UR Regulation:  Reviewed for med. necessity/level of care/duration of stay  If discussed at Long Length of Stay Meetings, dates discussed:    Comments:  03/07/12 Onnie Boer, RN, BSN 1610 PT NEEDS ANSWERS TO QUESTIONS AND WIFE WOULD LIKE TO HAVE MORE INFO ON HH NEED AND EQUIPMENT. GOING TO ASK PT TO REVISIT AND RECHECK NEEDS AT THIS TIME, ASK CIR TO LOOK AT PT AND HAVE ASKED CSW TO LOOK AT FIGURES IF PT CHOOSES SNF OUT OF POCKET.  PLAN IS TO DC TOMORROW.  03/06/12 Onnie Boer, RN, BSN 1612 PTA PT WAS AT HOME WITH HIS WIFE AND HAS BEEN HAVING SOB FOR A WHILE AND WEAKNESS FOR A WEEK OR SO.  PT WIFE WORKS 8 HRS A DAY.  PT CURRENTLY WORKING WITH HIM AND HE IS ABLE TO STAND AND WALK TO THE DOOR.  PT IS UNDECIDED ABOUT DISPOSITION AS WE NEED MORE INFORMATION FROM WIFE AS SHE HAS STATED THAT SHE CAN BE OFF WORK FOR A WHILE.  WILL F/U IN THE AM.

## 2012-03-07 NOTE — Progress Notes (Addendum)
Patient ID: SRIKAR CHIANG, male   DOB: 07/25/1929, 78 y.o.   MRN: [id number]  Assessment the Plan   Principal problem  *Weakness and dehydration  - Lasix held on admission - PT recommends skilled nursing facility versus home health PT - statin held on admission for possible statin induced myopathy  Active problems  DIABETES, TYPE 2  - Cont fsg monitoring and SSI, cont home meds   HYPERTENSION  - Stable  - cont home meds except lasix   CAD  - stable , cont home meds aspirin, Plavix  Cardiomyopathy  - check BNP level - 2 D ECHO done 03/06/2012 is with normal EF - Lasix held on admission - BP 153/66 - we will resume lasix - Continue Coreg, Imdur, Losartan  Mediastinal fibrosis  - Cont home inhalers  - Seems at baseline  - F/up as outpatient   DVT prophylaxis: lovenox sub Q  Diet: Diabetic  Advance directives :Full code  Disposition - PT recommendation- skilled nursing facility or home health PT; spoke with patient's wife who reported she would like the patient to come home   Subjective: No events overnight. Patient denies chest pain, shortness of breath, abdominal pain.  Objective:  Vital signs in last 24 hours:   03/06/12 2136 03/07/12 0540 03/07/12 1300  BP: 169/79 157/70 153/66  Pulse: 71 62 71  Temp: 98.3 F (36.8 C) 97.9 F (36.6 C) 98.1 F (36.7 C)  Resp: 18 18 16   SpO2: 96% 95% 96%    Intake/Output from previous day:  Intake/Output Summary (Last 24 hours) at 03/07/12 1640 Last data filed at 03/06/12 2300  Gross per 24 hour  Intake    360 ml  Output    450 ml  Net    -90 ml    Physical Exam: General: Alert, awake, oriented x3, in no acute distress. HEENT: No bruits, no goiter. Moist mucous membranes, no scleral icterus, no conjunctival pallor. Heart: Regular rate and rhythm, S1/S2 +, no murmurs, rubs, gallops. Lungs: Clear to auscultation bilaterally. No wheezing, no rhonchi, no rales.  Abdomen: Soft, nontender, nondistended, positive  bowel sounds. Extremities: No clubbing or cyanosis, (+1) LE pitting edema,  positive pedal pulses. Neuro: Grossly nonfocal.  Lab Results:  Lab 03/05/12 2315  WBC 9.5  HGB 11.0*  HCT 31.1*  PLT 216  MCV 94.0    Lab 03/07/12 0535 03/05/12 2315  NA 140 142  K 3.9 3.8  CL 107 107  CO2 23 23  GLUCOSE 186* 265*  BUN 21 26*  CREATININE 1.04 1.10  CALCIUM 9.4 9.9   Cardiac markers:  Lab 03/05/12 2315  TROPONINI <0.30    Studies/Results: Ct Head Wo Contrast 03/06/2012    IMPRESSION: Advanced atrophy and microvascular disease without definite superimposed acute intracranial process.    Dg Chest Port 1 View 03/06/2012 IMPRESSION: Bilateral infrahilar/paramediastinal masses are similar to prior, previously characterized as fibrosis.  Bibasilar opacities and small pleural effusions; atelectasis versus infiltrate.     Medications: Scheduled Meds:   . aspirin  81 mg Oral Daily  . bicalutamide  50 mg Oral Daily  . carvedilol  25 mg Oral BID WC  . clopidogrel  75 mg Oral Q breakfast  . enoxaparin  40 mg Subcutaneous Q24H  . ezetimibe  10 mg Oral Daily  . Fluticasone-Salmeterol  1 puff Inhalation BID  . glipiZIDE  5 mg Oral QAC breakfast  . insulin aspart  0-15 Units Subcutaneous TID WC  . isosorbide mononitrate  60 mg Oral  Daily  . linagliptin  5 mg Oral Daily  . losartan  50 mg Oral Daily  . mulitivitamin with minerals  1 tablet Oral Daily  . omega-3 acid ethyl esters  1 g Oral BID  . tiotropium  18 mcg Inhalation Daily   Continuous Infusions:  PRN Meds:.sodium chloride, albuterol, senna-docusate, sodium chloride    LOS: 2 days   Odell Choung 03/07/2012, 4:40 PM  [facility] Pager: [phone]

## 2012-03-07 NOTE — Progress Notes (Signed)
Utilization review completed.  

## 2012-03-08 DIAGNOSIS — R5381 Other malaise: Secondary | ICD-10-CM

## 2012-03-08 DIAGNOSIS — E860 Dehydration: Secondary | ICD-10-CM

## 2012-03-08 DIAGNOSIS — R5383 Other fatigue: Secondary | ICD-10-CM

## 2012-03-08 LAB — GLUCOSE, CAPILLARY: Glucose-Capillary: 139 mg/dL — ABNORMAL HIGH (ref 70–99)

## 2012-03-08 MED ORDER — FUROSEMIDE 10 MG/ML IJ SOLN
40.0000 mg | Freq: Every day | INTRAMUSCULAR | Status: DC
  Administered 2012-03-08 – 2012-03-09 (×2): 40 mg via INTRAVENOUS
  Filled 2012-03-08 (×2): qty 4

## 2012-03-08 NOTE — Progress Notes (Addendum)
Patient ID: [name], male   DOB: 01-04-1929, 78 y.o.   MRN: [id number]  Assessment the Plan   Principal problem   *Weakness and dehydration  - Lasix held on admission but now restarted for chronic diastolic CHF - PT recommends skilled nursing facility versus home health PT  - statin held on admission for possible statin induced myopathy   Active problems   Cardiomyopathy, Chronic Diastolic CHF  - BNP elevaed at ~3,000 - 2 D ECHO done 03/06/2012 is with normal EF  - Lasix held on admission but since BNP elevated and no previous value available for comparison we will start lasix 40 mg daily IV - follow up BNP tomorrow am; perhaps this value lies around patient's baseline - BP 155/79 - Continue Coreg, Imdur, Losartan   DIABETES, TYPE 2  - Cont fsg monitoring and SSI, cont home meds   HYPERTENSION  - Stable  - cont home meds except lasix   CAD  - stable , cont home meds aspirin, Plavix   Mediastinal fibrosis  - Cont home inhalers  - Seems at baseline  - F/up as outpatient   DVT prophylaxis: lovenox sub Q   Diet: Diabetic   Advance directives :Full code   Disposition  - will try to meet with patient's wife per her availability and SW to discuss what the safest discharge plan would be  Subjective: No events overnight. Patient denies chest pain, shortness of breath, abdominal pain.  Objective:  Vital signs in last 24 hours:   03/07/12 2042 03/08/12 0633  BP: 158/79 155/79  Pulse: 57 56  Temp: 97.9 F (36.6 C) 98.7 F (37.1 C)  TempSrc: Oral   Resp: 18 18  Height:    Weight:    SpO2: 94% 96%    Intake/Output from previous day:  Intake/Output Summary (Last 24 hours) at 03/08/12 1231 Last data filed at 03/08/12 0800  Gross per 24 hour  Intake    240 ml  Output    850 ml  Net   -610 ml    Physical Exam: General: Alert, awake, oriented x3, in no acute distress. HEENT: No bruits, no goiter. Moist mucous membranes, no scleral icterus, no  conjunctival pallor. Heart: Regular rate and rhythm, S1/S2 +, no murmurs, rubs, gallops. Lungs: Clear to auscultation bilaterally. No wheezing, no rhonchi, no rales.  Abdomen: Soft, nontender, nondistended, positive bowel sounds. Extremities: No clubbing or cyanosis, no pitting edema,  positive pedal pulses. Neuro: Grossly nonfocal.  Lab Results:  Lab 03/05/12 2315  WBC 9.5  HGB 11.0*  HCT 31.1*  PLT 216  MCV 94.0    Lab 03/07/12 0535 03/05/12 2315  NA 140 142  K 3.9 3.8  CL 107 107  CO2 23 23  GLUCOSE 186* 265*  BUN 21 26*  CREATININE 1.04 1.10  CALCIUM 9.4 9.9    Cardiac markers:  Lab 03/05/12 2315  CKMB --  TROPONINI <0.30  MYOGLOBIN --    Studies/Results: No results found.  Medications: Scheduled Meds:   . aspirin  81 mg Oral Daily  . bicalutamide  50 mg Oral Daily  . carvedilol  25 mg Oral BID WC  . clopidogrel  75 mg Oral Q breakfast  . enoxaparin  40 mg Subcutaneous Q24H  . ezetimibe  10 mg Oral Daily  . Fluticasone-Salmeterol  1 puff Inhalation BID  . furosemide  20 mg Oral QODAY  . glipiZIDE  5 mg Oral QAC breakfast  . insulin aspart  0-15 Units Subcutaneous  TID WC  . isosorbide mononitrate  60 mg Oral Daily  . linagliptin  5 mg Oral Daily  . losartan  50 mg Oral Daily  . mulitivitamin with minerals  1 tablet Oral Daily  . omega-3 acid ethyl esters  1 g Oral BID  . sodium chloride  3 mL Intravenous Q12H  . tiotropium  18 mcg Inhalation Daily   Continuous Infusions:  PRN Meds:.sodium chloride, albuterol, senna-docusate, sodium chloride    LOS: 3 days   Daytona Hedman 03/08/2012, 12:31 PM  [facility] Pager: [phone]

## 2012-03-08 NOTE — Progress Notes (Signed)
Physical Therapy Treatment Patient Details Name: HARTLEY URTON MRN: [id number] DOB: [birth year] Today's Date: 03/08/2012 Time: 7829-5621 PT Time Calculation (min): 40 min  PT Assessment / Plan / Recommendation Comments on Treatment Session  Family ed with pt and wife completed.  Wife has a much better feel on how pt is doing now.  Pt needs HHPT to help make him mobility more effricient and safe.    Follow Up Recommendations  Home health PT;Supervision for mobility/OOB    Equipment Recommendations  Rolling walker with 5" wheels;3 in 1 bedside comode    Frequency Min 3X/week   Plan Discharge plan remains appropriate    Precautions / Restrictions Precautions Precautions: Fall Restrictions Weight Bearing Restrictions: No   Pertinent Vitals/Pain     Mobility  Bed Mobility Bed Mobility: Rolling Left;Left Sidelying to Sit Rolling Left: 4: Min guard Left Sidelying to Sit: 4: Min guard Sit to Supine: 4: Min guard Details for Bed Mobility Assistance: vc's for guidance, and best hand placement, better options for bed mobility Transfers Transfers: Sit to Stand;Stand to Sit Sit to Stand: 4: Min guard;With upper extremity assist;From bed Stand to Sit: 4: Min assist;With upper extremity assist;To chair/3-in-1 Details for Transfer Assistance: visual/ vc's for technique/safety/ hand placement Ambulation/Gait Ambulation/Gait Assistance: 4: Min guard Ambulation Distance (Feet): 130 Feet Assistive device: Rolling walker Ambulation/Gait Assistance Details: slow, variable, but steady gait with RW. Gait Pattern: Step-through pattern;Decreased step length - right;Decreased step length - left;Decreased stride length (can not walk and converse at the same time well) Gait velocity: slow and guarded Stairs: No Wheelchair Mobility Wheelchair Mobility: No    Exercises     PT Goals Acute Rehab PT Goals PT Goal Formulation: With patient Time For Goal Achievement: 03/20/12 Potential to  Achieve Goals: Good PT Goal: Supine/Side to Sit - Progress: Progressing toward goal PT Goal: Sit to Stand - Progress: Progressing toward goal PT Transfer Goal: Bed to Chair/Chair to Bed - Progress: Progressing toward goal PT Goal: Ambulate - Progress: Progressing toward goal  Visit Information  Last PT Received On: 03/08/12 Assistance Needed: +1    Subjective Data  Subjective: wife states that she would like him home and feels better about him now that she has seen him move Patient Stated Goal: to get back home at the level I was   Cognition  Overall Cognitive Status: Appears within functional limits for tasks assessed/performed Arousal/Alertness: Awake/alert Orientation Level: Appears intact for tasks assessed Behavior During Session: [facility] for tasks performed    Balance  Balance Balance Assessed: No  End of Session PT - End of Session Activity Tolerance: Patient tolerated treatment well Patient left: in chair;with call bell/phone within reach;with family/visitor present Nurse Communication: Mobility status    Allan Minotti, Eliseo Gum 03/08/2012, 1:16 PM  03/08/2012  [place], PT [phone] (pager)

## 2012-03-09 DIAGNOSIS — R5383 Other fatigue: Secondary | ICD-10-CM

## 2012-03-09 DIAGNOSIS — R5381 Other malaise: Secondary | ICD-10-CM

## 2012-03-09 DIAGNOSIS — E860 Dehydration: Secondary | ICD-10-CM

## 2012-03-09 LAB — CBC
HCT: 29.9 % — ABNORMAL LOW (ref 39.0–52.0)
Hemoglobin: 10.1 g/dL — ABNORMAL LOW (ref 13.0–17.0)
RDW: 13.9 % (ref 11.5–15.5)
WBC: 4.7 10*3/uL (ref 4.0–10.5)

## 2012-03-09 LAB — GLUCOSE, CAPILLARY

## 2012-03-09 LAB — BASIC METABOLIC PANEL
Chloride: 102 mEq/L (ref 96–112)
Creatinine, Ser: 1.1 mg/dL (ref 0.50–1.35)
GFR calc Af Amer: 71 mL/min — ABNORMAL LOW (ref >90)
Potassium: 3.4 mEq/L — ABNORMAL LOW (ref 3.5–5.1)

## 2012-03-09 MED ORDER — POTASSIUM CHLORIDE CRYS ER 20 MEQ PO TBCR
40.0000 meq | EXTENDED_RELEASE_TABLET | Freq: Once | ORAL | Status: AC
  Administered 2012-03-09: 40 meq via ORAL
  Filled 2012-03-09: qty 2

## 2012-03-09 MED ORDER — POTASSIUM CHLORIDE ER 10 MEQ PO TBCR: 10.0000 meq | EXTENDED_RELEASE_TABLET | ORAL | Status: DC

## 2012-03-09 NOTE — Care Management Note (Signed)
    Page 1 of 2   03/09/2012     4:37:28 PM   CARE MANAGEMENT NOTE 03/09/2012  Patient:  [name], [name]   Account Number:  [id number]  Date Initiated:  03/06/2012  Documentation initiated by:  Onnie Boer  Subjective/Objective Assessment:   PT WAS ADMITTED WITH WEAKNESS     Action/Plan:   PROGRESSION OF CARE AND DISCHARGE PLANNING   Anticipated DC Date:  03/09/2012   Anticipated DC Plan:  HOME/SELF CARE  In-house referral  Clinical Social Worker      DC Planning Services  CM consult      Choice offered to / List presented to:     DME arranged  3-N-1  WALKER - Lovina Reach      DME agency  [facility]     HH arranged  HH-1 RN  HH-2 PT  HH-6 SOCIAL WORKER  HH-4 NURSE'S AIDE      HH agency  [facility]   Status of service:  Completed, signed off Medicare Important Message given?   (If response is "NO", the following Medicare IM given date fields will be blank) Date Medicare IM given:   Date Additional Medicare IM given:    Discharge Disposition:  HOME W HOME HEALTH SERVICES  Per UR Regulation:  Reviewed for med. necessity/level of care/duration of stay  If discussed at Long Length of Stay Meetings, dates discussed:    Comments:  03/09/12 Onnie Boer, RN, BSN 1636 PT DC'D TO HOME WITH [facility] WITH [facility].  FAMILY GIVEN A LIST OF PRIVATE DUTY SERVICES.  03/07/12 Onnie Boer, RN, BSN 1610 PT NEEDS ANSWERS TO QUESTIONS AND WIFE WOULD LIKE TO HAVE MORE INFO ON HH NEED AND EQUIPMENT. GOING TO ASK PT TO REVISIT AND RECHECK NEEDS AT THIS TIME, ASK CIR TO LOOK AT PT AND HAVE ASKED CSW TO LOOK AT FIGURES IF PT CHOOSES SNF OUT OF POCKET.  PLAN IS TO DC TOMORROW.  03/06/12 Onnie Boer, RN, BSN 1612 PTA PT WAS AT HOME WITH HIS WIFE AND HAS BEEN HAVING SOB FOR A WHILE AND WEAKNESS FOR A WEEK OR SO.  PT WIFE WORKS 8 HRS A DAY.  PT CURRENTLY WORKING WITH HIM AND HE IS ABLE TO STAND AND WALK TO THE DOOR.  PT IS UNDECIDED ABOUT DISPOSITION AS WE NEED MORE  INFORMATION FROM WIFE AS SHE HAS STATED THAT SHE CAN BE OFF WORK FOR A WHILE.  WILL F/U IN THE AM.

## 2012-03-09 NOTE — Discharge Summary (Addendum)
Patient ID: [name] MRN: [id number] DOB/AGE: [birth year] 76 y.o.  Admit date: 03/05/2012 Discharge date: 03/09/2012  Primary Care Physician:  Daisy Floro, MD, MD   Discharge Diagnoses:    Present on Admission:  .Weakness .Dehydration .DIABETES, TYPE 2 .HYPERTENSION .CORONARY ARTERY DISEASE .CARDIOMYOPATHY, CONGESTIVE .G E R D .MEDIASTINAL FIBROSIS  Medication List  As of 03/09/2012 11:06 AM   TAKE these medications         aspirin 81 MG tablet   Take 81 mg by mouth daily.      bicalutamide 50 MG tablet   Commonly known as: CASODEX   Take 50 mg by mouth daily.      carvedilol 25 MG tablet   Commonly known as: COREG   Take 25 mg by mouth 2 (two) times daily with a meal.      clopidogrel 75 MG tablet   Commonly known as: PLAVIX   Take 75 mg by mouth daily.      DULERA 200-5 MCG/ACT Aero   Generic drug: Mometasone Furo-Formoterol Fum   USE 2 INHALATIONS TWICE A DAY      ELIGARD [place]   Every 6 months      ezetimibe 10 MG tablet   Commonly known as: ZETIA   Take 10 mg by mouth daily.      furosemide 20 MG tablet   Commonly known as: LASIX   Take 20 mg by mouth every other day.      glipiZIDE 5 MG tablet   Commonly known as: GLUCOTROL   Take 5 mg by mouth daily.      isosorbide mononitrate 60 MG 24 hr tablet   Commonly known as: IMDUR   Take 60 mg by mouth daily.      losartan 50 MG tablet   Commonly known as: COZAAR   Take 50 mg by mouth daily.      multivitamin capsule   Take 1 capsule by mouth daily.      Omega-3 350 MG Caps   Take 2 capsules by mouth 2 (two) times daily.      potassium chloride 10 MEQ tablet   Commonly known as: K-DUR   Take 1 tablet (10 mEq total) by mouth every other day.      PROAIR HFA 108 (90 BASE) MCG/ACT inhaler   Generic drug: albuterol   Inhale 2 puffs into the lungs every 6 (six) hours as needed.      rosuvastatin 20 MG tablet   Commonly known as: CRESTOR   Take 20 mg by mouth daily.      sitaGLIPtin  100 MG tablet   Commonly known as: JANUVIA   Take 100 mg by mouth daily.      tiotropium 18 MCG inhalation capsule   Commonly known as: SPIRIVA   Place 18 mcg into inhaler and inhale daily.             Consults:  None   Significant Diagnostic Studies:  Ct Head Wo Contrast  03/06/2012  *RADIOLOGY REPORT*  Clinical Data: Weakness for 3 days  CT HEAD WITHOUT CONTRAST  Technique:  Contiguous axial images were obtained from the base of the skull through the vertex without contrast.  Comparison: None.  Findings:  Global atrophy, advanced, even for age.  This is associated mild ex vacuo dilatation of the ventricular system.  Scattered periventricular hypodensities compatible microvascular ischemic disease.  Given background parenchymal abnormalities, there is no definitive evidence of acute large territory infarct.  No interparenchymal or  extra-axial mass or hemorrhage.  Vascular calcifications.  Polypoid mucosal thickening within the right maxillary sinus.  Post bilateral cataract surgery.  IMPRESSION: Advanced atrophy and microvascular disease without definite superimposed acute intracranial process.  Original Report Authenticated By: Waynard Reeds, M.D.   Dg Chest Port 1 View  03/06/2012  *RADIOLOGY REPORT*  Clinical Data: Weakness  PORTABLE CHEST - 1 VIEW  Comparison: 09/12/2010  Findings: Blunted costophrenic angles, similar to prior.  Right lower lobe opacity. Bilateral infrahilar masses are similar to prior and described as fibrosis on comparison CT.  Cardiomegaly. No pneumothorax.  No definite acute osseous abnormality.  IMPRESSION: Bilateral infrahilar/paramediastinal masses are similar to prior, previously characterized as fibrosis.  Bibasilar opacities and small pleural effusions; atelectasis versus infiltrate.  Original Report Authenticated By: Waneta Martins, M.D.    Brief H and P: For complete details please refer to admission H and P, but in brief 78 y/o male with hx of CAD,  CHF, DM, GERD, prostate ca, mediastinal fibrosis presents with 1 day of generalized weakness. He was sitting on his commode yesterday morning but could not get up due to weakness for several hours ( almost 12 hrs and shouted for help). he informs feeling weak for past several days and also slightly more short of breath and poor appetite.  He denies any fever, worsening cough, chills, muscle pains, chest pain , palpitations, headache or dizziness. No abdominal pain, diarrhea or urinary symptoms.  In the high pt ED he was noted to be weak and dehydrated and triad called for admission for observation   Hospital Course:  *Weakness and dehydration  - Lasix held on admission but now restarted for chronic diastolic CHF  - PT recommends skilled nursing facility versus home health PT. He declined SNF - statin held on admission for possible statin induced myopathy ,however his CK level is normal and he denies any pain,will resume . Active problems  Cardiomyopathy, Chronic Diastolic CHF  - BNP elevaed at ~3,000 ,repeat trending down to 1562 - 2 D ECHO done 03/06/2012 is with normal EF  - Lasix held on admission but since BNP elevated and no previous value available for comparison ,given asix 40 mg daily for one day,patient is clinically compensated ,will discharge on po lasix as per outpatient dose ,add low dose K .will give 40 meq before discharge. - Continued on  Coreg, Imdur, Losartan  DIABETES, TYPE 2  - controlled ,patient was continued on home meds .HBA1C 5.7% HYPERTENSION  - Stable ,continued on  home meds  CAD  - stable , continued on  home meds aspirin, Plavix  Mediastinal fibrosis  - Continued on  home inhalers . F/up as outpatient   Disposition  Will D/C home with [facility] services(PT/RN/SW/AIDE),will also order for Rolling walker ,wheel chair and bedside commode,case manger will discuss 24 hour assistance with wife .  Subjective: Patient seen and examined ,feeling a lot better and denies any  complaints    Filed Vitals:   03/09/12 0547  BP: 150/68  Pulse: 60  Temp:   Resp:     General: Alert, awake, oriented x3, in no acute distress.  HEENT: No bruits, no goiter. Moist mucous membranes, no scleral icterus, no conjunctival pallor.  Heart: Regular rate and rhythm, S1/S2 +, no murmurs, rubs, gallops.  Lungs: Clear to auscultation bilaterally. No wheezing, no rhonchi, no rales.  Abdomen: Soft, nontender, nondistended, positive bowel sounds.  Extremities: No clubbing or cyanosis, no pitting edema, positive pedal pulses.  Neuro: Grossly  nonfocal.    Disposition and Follow-up:  To home with [facility] services Follow with PCP,cardiologist and pulmonologist as outpatient    Time spent on Discharge: approximately 45 minutes   Signed: Torrence Branagan 03/09/2012, 11:06 AM

## 2012-06-07 ENCOUNTER — Encounter: Payer: Self-pay | Admitting: Critical Care Medicine

## 2012-06-07 ENCOUNTER — Ambulatory Visit (INDEPENDENT_AMBULATORY_CARE_PROVIDER_SITE_OTHER): Payer: Medicare Other | Admitting: Critical Care Medicine

## 2012-06-07 VITALS — BP 120/74 | HR 84 | Temp 98.0°F | Ht 66.0 in | Wt 196.0 lb

## 2012-06-07 DIAGNOSIS — J9859 Other diseases of mediastinum, not elsewhere classified: Secondary | ICD-10-CM

## 2012-06-07 MED ORDER — FLUTICASONE PROPIONATE 50 MCG/ACT NA SUSP: 2.0000 | Freq: Every day | NASAL | Status: DC

## 2012-06-07 MED ORDER — AMOXICILLIN-POT CLAVULANATE 875-125 MG PO TABS: 1.0000 | ORAL_TABLET | Freq: Two times a day (BID) | ORAL | Status: AC

## 2012-06-07 MED ORDER — TIOTROPIUM BROMIDE MONOHYDRATE 18 MCG IN CAPS: 18.0000 ug | ORAL_CAPSULE | Freq: Every day | RESPIRATORY_TRACT | Status: DC

## 2012-06-07 MED ORDER — MOMETASONE FURO-FORMOTEROL FUM 200-5 MCG/ACT IN AERO: 2.0000 | INHALATION_SPRAY | Freq: Two times a day (BID) | RESPIRATORY_TRACT | Status: DC

## 2012-06-07 NOTE — Progress Notes (Signed)
Subjective:    Patient ID: [name], male    DOB: 01-25-1929, 79 y.o.   MRN: [id number]  HPI  78 y.o.  WM  06/07/2012 Pt in hosp 4/29 - 5/3 for weakness and dehydration.  Since stays weak and not driving now. Pt did finished home PT.  Not out that much. Pt denies anymore dyspnea than usual Notes some cough and is green mucus.  Pt denies any wheeze.  ? Sinus issue.   Pt notes some ant headaches.    Review of Systems  Constitutional:   No  weight loss, night sweats,  Fevers, chills,++ fatigue,++ lassitude. HEENT:   No headaches,  Difficulty swallowing,  Tooth/dental problems,  Sore throat,                No sneezing, itching, ear ache, nasal congestion, post nasal drip,   CV:  No chest pain,  Orthopnea, PND, swelling in lower extremities, anasarca, dizziness, palpitations  GI  No heartburn, indigestion, abdominal pain, nausea, vomiting, diarrhea, change in bowel habits, loss of appetite  Resp: Notes shortness of breath with exertion not at rest.  Notes  excess mucus, notes productive cough,  No non-productive cough,  No coughing up of blood.  Notes  change in color of mucus.  No wheezing.  No chest wall deformity  Skin: no rash or lesions.  GU: no dysuria, change in color of urine, no urgency or frequency.  No flank pain.  MS:  No joint pain or swelling.  No decreased range of motion.  No back pain.  Psych:  No change in mood or affect. No depression or anxiety.  No memory loss.     Objective:   Physical Exam  Filed Vitals:   06/07/12 1630  BP: 120/74  Pulse: 84  Temp: 98 F (36.7 C)  TempSrc: Oral  Height: 5\' 6"  (1.676 m)  Weight: 196 lb (88.905 kg)  SpO2: 93%    Gen: Pleasant, well-nourished, in no distress,  normal affect  ENT: No lesions,  mouth clear,  oropharynx clear, no postnasal drip  Neck: No JVD, no TMG, no carotid bruits  Lungs: No use of accessory muscles, no dullness to percussion, distant BS  Cardiovascular: RRR, heart sounds normal, no  murmur or gallops, no peripheral edema  Abdomen: soft and NT, no HSM,  BS normal  Musculoskeletal: No deformities, no cyanosis or clubbing  Neuro: alert, non focal  Skin: Warm, no lesions or rashes        Assessment & Plan:   MEDIASTINAL FIBROSIS Mediastinal fibrosis with central airway obstruction and superimposed acute tracheobronchitis Plan Augmentin for 10 days Nasal hygiene Maintain inhaled medications as prescribed    Updated Medication List Outpatient Encounter Prescriptions as of 06/07/2012  Medication Sig Dispense Refill  . albuterol (PROAIR HFA) 108 (90 BASE) MCG/ACT inhaler Inhale 2 puffs into the lungs every 6 (six) hours as needed.        Marland Kitchen aspirin 81 MG tablet Take 81 mg by mouth daily.        . bicalutamide (CASODEX) 50 MG tablet Take 50 mg by mouth daily.        . carvedilol (COREG) 25 MG tablet Take 25 mg by mouth 2 (two) times daily with a meal.        . clopidogrel (PLAVIX) 75 MG tablet Take 75 mg by mouth daily.        Marland Kitchen ezetimibe (ZETIA) 10 MG tablet Take 10 mg by mouth daily.        Marland Kitchen  furosemide (LASIX) 20 MG tablet Take 20 mg by mouth every other day.        Marland Kitchen glipiZIDE (GLUCOTROL) 5 MG tablet Take 5 mg by mouth daily.        . isosorbide mononitrate (IMDUR) 60 MG 24 hr tablet Take 60 mg by mouth daily.        Marland Kitchen Leuprolide Acetate (ELIGARD [place]) Every 6 months       . losartan (COZAAR) 50 MG tablet Take 50 mg by mouth daily.        . Mometasone Furo-Formoterol Fum (DULERA) 200-5 MCG/ACT AERO Inhale 2 puffs into the lungs 2 (two) times daily.  3 Inhaler  4  . Omega-3 350 MG CAPS Take 2 capsules by mouth 2 (two) times daily.        . potassium chloride (K-DUR) 10 MEQ tablet Take 1 tablet (10 mEq total) by mouth every other day.  15 tablet  0  . rosuvastatin (CRESTOR) 20 MG tablet Take 20 mg by mouth daily.        . sitaGLIPtin (JANUVIA) 100 MG tablet Take 100 mg by mouth daily.        Marland Kitchen tiotropium (SPIRIVA) 18 MCG inhalation capsule Place 1 capsule (18 mcg  total) into inhaler and inhale daily.  90 capsule  4  . DISCONTD: DULERA 200-5 MCG/ACT AERO USE 2 INHALATIONS TWICE A DAY  1 Inhaler  0  . DISCONTD: tiotropium (SPIRIVA) 18 MCG inhalation capsule Place 18 mcg into inhaler and inhale daily.        Marland Kitchen amoxicillin-clavulanate (AUGMENTIN) 875-125 MG per tablet Take 1 tablet by mouth 2 (two) times daily.  20 tablet  0  . fluticasone (FLONASE) 50 MCG/ACT nasal spray Place 2 sprays into the nose daily.  16 g  0  . DISCONTD: Multiple Vitamin (MULTIVITAMIN) capsule Take 1 capsule by mouth daily.

## 2012-06-07 NOTE — Patient Instructions (Addendum)
[facility]   Sent to pharmacy downstairs: Augmentin one twice daily for 10days  Fluticasone nasal spray two sprays each nostril daily   Return 4 months

## 2012-06-08 NOTE — Assessment & Plan Note (Signed)
Mediastinal fibrosis with central airway obstruction and superimposed acute tracheobronchitis Plan Augmentin for 10 days Nasal hygiene Maintain inhaled medications as prescribed

## 2012-10-15 ENCOUNTER — Telehealth: Payer: Self-pay | Admitting: Critical Care Medicine

## 2012-10-15 NOTE — Telephone Encounter (Signed)
Left Message 3x to schd follow up apt. Sent letter 10/15/12. °

## 2013-08-20 ENCOUNTER — Other Ambulatory Visit: Payer: Self-pay | Admitting: General Surgery

## 2013-08-20 MED ORDER — ISOSORBIDE MONONITRATE ER 60 MG PO TB24: 60.0000 mg | ORAL_TABLET | Freq: Every day | ORAL | Status: DC

## 2013-10-17 ENCOUNTER — Other Ambulatory Visit: Payer: Self-pay | Admitting: Cardiology

## 2013-11-12 ENCOUNTER — Other Ambulatory Visit: Payer: Self-pay | Admitting: Cardiology

## 2013-12-27 ENCOUNTER — Other Ambulatory Visit: Payer: Self-pay | Admitting: Cardiology

## 2013-12-27 MED ORDER — CARVEDILOL 25 MG PO TABS: 25.0000 mg | ORAL_TABLET | Freq: Two times a day (BID) | ORAL | Status: DC

## 2013-12-27 NOTE — Telephone Encounter (Signed)
Refilled coreg.

## 2014-01-16 ENCOUNTER — Ambulatory Visit: Payer: Medicare Other | Admitting: Cardiology

## 2014-02-11 ENCOUNTER — Ambulatory Visit (INDEPENDENT_AMBULATORY_CARE_PROVIDER_SITE_OTHER): Payer: Medicare Other | Admitting: Cardiology

## 2014-02-11 ENCOUNTER — Encounter: Payer: Self-pay | Admitting: General Surgery

## 2014-02-11 ENCOUNTER — Encounter: Payer: Self-pay | Admitting: Cardiology

## 2014-02-11 VITALS — BP 122/60 | HR 66 | Ht 68.0 in | Wt 170.0 lb

## 2014-02-11 DIAGNOSIS — I10 Essential (primary) hypertension: Secondary | ICD-10-CM

## 2014-02-11 DIAGNOSIS — J449 Chronic obstructive pulmonary disease, unspecified: Secondary | ICD-10-CM | POA: Insufficient documentation

## 2014-02-11 DIAGNOSIS — I2510 Atherosclerotic heart disease of native coronary artery without angina pectoris: Secondary | ICD-10-CM

## 2014-02-11 DIAGNOSIS — I2589 Other forms of chronic ischemic heart disease: Secondary | ICD-10-CM | POA: Insufficient documentation

## 2014-02-11 DIAGNOSIS — I5032 Chronic diastolic (congestive) heart failure: Secondary | ICD-10-CM

## 2014-02-11 DIAGNOSIS — E7800 Pure hypercholesterolemia, unspecified: Secondary | ICD-10-CM

## 2014-02-11 LAB — BASIC METABOLIC PANEL
BUN: 24 mg/dL — AB (ref 6–23)
CALCIUM: 9.3 mg/dL (ref 8.4–10.5)
CO2: 25 mEq/L (ref 19–32)
CREATININE: 1.5 mg/dL (ref 0.4–1.5)
Chloride: 104 mEq/L (ref 96–112)
GFR: 46.44 mL/min — AB (ref >60.00)
Glucose, Bld: 126 mg/dL — ABNORMAL HIGH (ref 70–99)
POTASSIUM: 5 meq/L (ref 3.5–5.1)
Sodium: 138 mEq/L (ref 135–145)

## 2014-02-11 NOTE — Progress Notes (Signed)
[address] [place], [place]  [address] Phone: [phone] Fax:  [phone]  Date:  02/11/2014   ID:  [name], DOB [birth year], MRN [id number]  PCP:   Melinda Crutch, MD  Cardiologist:  Fransico Him, MD     History of Present Illness: [name] is a 80 y.o. male with a history of ASCAD s/p PCI of the LAD after a NSTEMI with known residual disease of the distal LAD and moderate disease of the distal left circ and RCA on medical management, dyslipidemia, prior ischemic DCM with EF now normalized, HTN and chronic diastolic CHF who presents toady for followup.  He is doing well.  He denies any chest pain, LE edema, dizziness, palpitations or syncope.  He has chronic SOB from COPD which is stable.     Wt Readings from Last 3 Encounters:  02/11/14 170 lb (77.111 kg)  06/07/12 196 lb (88.905 kg)  03/06/12 195 lb 15.8 oz (88.9 kg)     Past Medical History  Diagnosis Date  . Other diseases of mediastinum, not elsewhere classified   . Esophageal reflux   . Type II or unspecified type diabetes mellitus without mention of complication, not stated as uncontrolled   . Essential hypertension   . COPD (chronic obstructive pulmonary disease)   . Dyslipidemia   . 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  Marland Kitchen Large kidney     RIght  . Kidney disease   . Malignant neoplasm of prostate   . Skin cancer     and AKs Dr Allyn Kenner  . History of echocardiogram 03/06/12    Normal LVF EF 65-70% no vavular disease  . Coronary atherosclerosis of unspecified type of vessel, native or graft     s/pp PCI of LAD after NSTEMI with residual disease in the distal LAD and moderate disease of the distal left circ and RCA on medical management  . Chronic diastolic CHF (congestive heart failure)   . Ischemic dilated cardiomyopathy     now resolved with normal LVF on echo 2013    Current Outpatient Prescriptions  Medication Sig Dispense  Refill  . albuterol (PROAIR HFA) 108 (90 BASE) MCG/ACT inhaler Inhale 2 puffs into the lungs every 6 (six) hours as needed.        Marland Kitchen aspirin 81 MG tablet Take 81 mg by mouth daily.        . bicalutamide (CASODEX) 50 MG tablet Take 50 mg by mouth daily.        . carvedilol (COREG) 25 MG tablet Take 1 tablet (25 mg total) by mouth 2 (two) times daily with a meal.  180 tablet  0  . clopidogrel (PLAVIX) 75 MG tablet Take 75 mg by mouth daily.        . furosemide (LASIX) 20 MG tablet Take 1 tablet (20 mg total) by mouth daily.  90 tablet  3  . glipiZIDE (GLUCOTROL) 5 MG tablet Take 5 mg by mouth daily.        . isosorbide mononitrate (IMDUR) 60 MG 24 hr tablet Take 1 tablet (60 mg total) by mouth daily.  90 tablet  3  . Leuprolide Acetate (ELIGARD [place]) Every 6 months       . losartan (COZAAR) 50 MG tablet Take 25 mg by mouth daily.       . Mometasone Furo-Formoterol Fum (DULERA) 200-5 MCG/ACT AERO Inhale 2 puffs into the lungs 2 (  two) times daily.  3 Inhaler  4  . Omega-3 350 MG CAPS Take 2 capsules by mouth 2 (two) times daily.        . rosuvastatin (CRESTOR) 20 MG tablet Take 10 mg by mouth daily.       Marland Kitchen ZETIA 10 MG tablet TAKE 1 TABLET DAILY  90 tablet  1  . fluticasone (FLONASE) 50 MCG/ACT nasal spray Place 2 sprays into the nose daily.  16 g  0  . potassium chloride (K-DUR) 10 MEQ tablet Take 1 tablet (10 mEq total) by mouth every other day.  15 tablet  0   No current facility-administered medications for this visit.    Allergies:    Allergies  Allergen Reactions  . Pioglitazone     REACTION: cough  . Ramipril Cough    Social History:  The patient  reports that he has never smoked. He has never used smokeless tobacco. He reports that he drinks alcohol. He reports that he does not use illicit drugs.   Family History:  The patient's family history includes Breast cancer in his mother; COPD in his brother; Heart disease in his father; Pancreatic cancer in his sister.   ROS:  Please see  the history of present illness.      All other systems reviewed and negative.   PHYSICAL EXAM: VS:  BP 122/60  Pulse 66  Ht 5\' 8"  (1.727 m)  Wt 170 lb (77.111 kg)  BMI 25.85 kg/m2 Well nourished, well developed, in no acute distress HEENT: normal Neck: no JVD Cardiac:  normal S1, S2; RRR; no murmur Lungs:  clear to auscultation bilaterally, no wheezing, rhonchi or rales Abd: soft, nontender, no hepatomegaly Ext: no edema Skin: warm and dry Neuro:  CNs 2-12 intact, no focal abnormalities noted     ASSESSMENT AND PLAN:  1. ASCAD with no angina - continue ASA/Plavix/Imdur 2. HTN well controlled - continue Coreg/Losartan 3. Dyslipidemia - continue Fish Oil/Zetia/Crestor - has an appt in May for fasting lipid panel and ALT 4. Chronic diastolic CHF - continue lasix - check BMET  Followup with me in 6 months  Signed, Fransico Him, MD 02/11/2014 9:06 AM

## 2014-02-11 NOTE — Patient Instructions (Signed)
Your physician recommends that you continue on your current medications as directed. Please refer to the Current Medication list given to you today.  Your physician recommends that you go to the lab today for a BMET  Your physician wants you to follow-up in: 6 months with Dr Turner You will receive a reminder letter in the mail two months in advance. If you don't receive a letter, please call our office to schedule the follow-up appointment.  

## 2014-02-12 ENCOUNTER — Telehealth: Payer: Self-pay | Admitting: Cardiology

## 2014-02-12 ENCOUNTER — Other Ambulatory Visit: Payer: Self-pay | Admitting: General Surgery

## 2014-02-12 DIAGNOSIS — Z79899 Other long term (current) drug therapy: Secondary | ICD-10-CM

## 2014-02-12 NOTE — Telephone Encounter (Signed)
Spoke to pts wife and made her aware about stopping Potassium supplement

## 2014-02-12 NOTE — Telephone Encounter (Signed)
New message     Nurse called pt today and told him to stop a medication.  Pt is confused.  Wife want to know exactly which medication he is to stop and why.

## 2014-02-19 ENCOUNTER — Other Ambulatory Visit (INDEPENDENT_AMBULATORY_CARE_PROVIDER_SITE_OTHER): Payer: Medicare Other | Admitting: *Deleted

## 2014-02-19 ENCOUNTER — Encounter: Payer: Self-pay | Admitting: General Surgery

## 2014-02-19 DIAGNOSIS — Z79899 Other long term (current) drug therapy: Secondary | ICD-10-CM

## 2014-02-19 LAB — BASIC METABOLIC PANEL
BUN: 23 mg/dL (ref 6–23)
CALCIUM: 9.2 mg/dL (ref 8.4–10.5)
CO2: 28 mEq/L (ref 19–32)
Chloride: 104 mEq/L (ref 96–112)
Creatinine, Ser: 1.4 mg/dL (ref 0.4–1.5)
GFR: 50.21 mL/min — AB (ref >60.00)
Glucose, Bld: 139 mg/dL — ABNORMAL HIGH (ref 70–99)
Potassium: 3.9 mEq/L (ref 3.5–5.1)
SODIUM: 137 meq/L (ref 135–145)

## 2014-03-20 ENCOUNTER — Encounter: Payer: Self-pay | Admitting: General Surgery

## 2014-03-20 ENCOUNTER — Ambulatory Visit (INDEPENDENT_AMBULATORY_CARE_PROVIDER_SITE_OTHER): Payer: Medicare Other | Admitting: *Deleted

## 2014-03-20 DIAGNOSIS — I10 Essential (primary) hypertension: Secondary | ICD-10-CM

## 2014-03-20 DIAGNOSIS — E7800 Pure hypercholesterolemia, unspecified: Secondary | ICD-10-CM

## 2014-03-20 DIAGNOSIS — I2510 Atherosclerotic heart disease of native coronary artery without angina pectoris: Secondary | ICD-10-CM

## 2014-03-20 LAB — HEPATIC FUNCTION PANEL
ALBUMIN: 3.6 g/dL (ref 3.5–5.2)
ALK PHOS: 64 U/L (ref 39–117)
ALT: 13 U/L (ref 0–53)
AST: 16 U/L (ref 0–37)
Bilirubin, Direct: 0.1 mg/dL (ref 0.0–0.3)
Total Bilirubin: 0.9 mg/dL (ref 0.2–1.2)
Total Protein: 6.7 g/dL (ref 6.0–8.3)

## 2014-03-20 LAB — LIPID PANEL
CHOLESTEROL: 109 mg/dL (ref 0–200)
HDL: 50 mg/dL (ref >39.00)
LDL Cholesterol: 41 mg/dL (ref 0–99)
TRIGLYCERIDES: 88 mg/dL (ref 0.0–149.0)
Total CHOL/HDL Ratio: 2
VLDL: 17.6 mg/dL (ref 0.0–40.0)

## 2014-04-19 ENCOUNTER — Other Ambulatory Visit: Payer: Self-pay | Admitting: Cardiology

## 2014-05-30 ENCOUNTER — Encounter: Payer: Self-pay | Admitting: Cardiology

## 2014-06-12 ENCOUNTER — Other Ambulatory Visit: Payer: Self-pay | Admitting: Cardiology

## 2014-06-30 ENCOUNTER — Other Ambulatory Visit: Payer: Self-pay | Admitting: Cardiology

## 2014-07-17 ENCOUNTER — Other Ambulatory Visit: Payer: Self-pay | Admitting: Cardiology

## 2014-07-18 ENCOUNTER — Other Ambulatory Visit: Payer: Self-pay | Admitting: Cardiology

## 2014-08-19 ENCOUNTER — Other Ambulatory Visit: Payer: Self-pay | Admitting: Cardiology

## 2014-08-22 ENCOUNTER — Other Ambulatory Visit: Payer: Self-pay | Admitting: *Deleted

## 2014-08-22 ENCOUNTER — Encounter: Payer: Self-pay | Admitting: Cardiology

## 2014-08-22 ENCOUNTER — Other Ambulatory Visit: Payer: Self-pay | Admitting: Cardiology

## 2014-08-22 ENCOUNTER — Ambulatory Visit (INDEPENDENT_AMBULATORY_CARE_PROVIDER_SITE_OTHER): Payer: Medicare Other | Admitting: Cardiology

## 2014-08-22 VITALS — BP 112/60 | HR 60 | Ht 68.0 in | Wt 160.2 lb

## 2014-08-22 DIAGNOSIS — J439 Emphysema, unspecified: Secondary | ICD-10-CM

## 2014-08-22 DIAGNOSIS — I5032 Chronic diastolic (congestive) heart failure: Secondary | ICD-10-CM

## 2014-08-22 DIAGNOSIS — I2510 Atherosclerotic heart disease of native coronary artery without angina pectoris: Secondary | ICD-10-CM

## 2014-08-22 DIAGNOSIS — N289 Disorder of kidney and ureter, unspecified: Secondary | ICD-10-CM

## 2014-08-22 DIAGNOSIS — I10 Essential (primary) hypertension: Secondary | ICD-10-CM

## 2014-08-22 DIAGNOSIS — Z23 Encounter for immunization: Secondary | ICD-10-CM

## 2014-08-22 DIAGNOSIS — E7800 Pure hypercholesterolemia, unspecified: Secondary | ICD-10-CM

## 2014-08-22 LAB — BASIC METABOLIC PANEL
BUN: 42 mg/dL — ABNORMAL HIGH (ref 6–23)
CALCIUM: 9.2 mg/dL (ref 8.4–10.5)
CO2: 22 mEq/L (ref 19–32)
CREATININE: 1.8 mg/dL — AB (ref 0.4–1.5)
Chloride: 105 mEq/L (ref 96–112)
GFR: 39.46 mL/min — AB (ref >60.00)
Glucose, Bld: 198 mg/dL — ABNORMAL HIGH (ref 70–99)
Potassium: 4.5 mEq/L (ref 3.5–5.1)
SODIUM: 136 meq/L (ref 135–145)

## 2014-08-22 NOTE — Progress Notes (Signed)
[address], [place], [place]  [address] Phone: [phone] Fax:  [phone]  Date:  08/22/2014   ID:  [name], DOB 1929-01-08, MRN [id number]  PCP:   Melinda Crutch, MD  Cardiologist:  Fransico Him, MD    History of Present Illness: [name] is a 80 y.o. male with a history of ASCAD s/p PCI of the LAD after a NSTEMI with known residual disease of the distal LAD and moderate disease of the distal left circ and RCA on medical management, dyslipidemia, prior ischemic DCM with EF now normalized, HTN and chronic diastolic CHF who presents toady for followup. He is doing well. He denies any chest pain, LE edema, dizziness, palpitations or syncope. He has chronic SOB from COPD which is stable.     Wt Readings from Last 3 Encounters:  08/22/14 160 lb 3.2 oz (72.666 kg)  02/11/14 170 lb (77.111 kg)  06/07/12 196 lb (88.905 kg)     Past Medical History  Diagnosis Date  . Other primary cardiomyopathies   . Other diseases of mediastinum, not elsewhere classified   . Esophageal reflux   . Type II or unspecified type diabetes mellitus without mention of complication, not stated as uncontrolled   . Unspecified essential hypertension   . COPD (chronic obstructive pulmonary disease)   . Dyslipidemia   . 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  Marland Kitchen Large kidney     RIght  . Kidney disease   . Malignant neoplasm of prostate   . Skin cancer     and AKs Dr Allyn Kenner  . History of echocardiogram 03/06/12    Normal LVF EF 65-70% no vavular disease  . Coronary atherosclerosis of unspecified type of vessel, native or graft     s/pp PCI of LAD after NSTEMI with residual disease in the distal LAD and moderate disease of the distal left circ and RCA on medical management  . Chronic diastolic CHF (congestive heart failure)   . Ischemic dilated cardiomyopathy     now resolved with normal LVF on echo 2013    Current  Outpatient Prescriptions  Medication Sig Dispense Refill  . albuterol (PROAIR HFA) 108 (90 BASE) MCG/ACT inhaler Inhale 2 puffs into the lungs every 6 (six) hours as needed.        Marland Kitchen aspirin 81 MG tablet Take 81 mg by mouth daily.        . carvedilol (COREG) 25 MG tablet Take 1 tablet (25 mg total) by mouth 2 (two) times daily with a meal.  180 tablet  0  . clopidogrel (PLAVIX) 75 MG tablet Take 75 mg by mouth daily.        . CRESTOR 20 MG tablet TAKE ONE-HALF (1/2) TABLET DAILY  45 tablet  3  . furosemide (LASIX) 20 MG tablet Take 1 tablet (20 mg total) by mouth daily.  90 tablet  3  . isosorbide mononitrate (IMDUR) 60 MG 24 hr tablet TAKE 1 TABLET DAILY  90 tablet  0  . Leuprolide Acetate (ELIGARD [place]) Every 6 months       . losartan (COZAAR) 50 MG tablet Take 25 mg by mouth daily.       . Omega-3 350 MG CAPS Take 2 capsules by mouth 2 (two) times daily.        . potassium chloride (K-DUR) 10 MEQ tablet TAKE 1 TABLET EVERY OTHER DAY  45 tablet  2  .  ZETIA 10 MG tablet TAKE 1 TABLET DAILY  90 tablet  1  . bicalutamide (CASODEX) 50 MG tablet Take 50 mg by mouth daily.        . fluticasone (FLONASE) 50 MCG/ACT nasal spray Place 2 sprays into the nose daily.  16 g  0  . glipiZIDE (GLUCOTROL) 5 MG tablet Take 5 mg by mouth daily.        . Mometasone Furo-Formoterol Fum (DULERA) 200-5 MCG/ACT AERO Inhale 2 puffs into the lungs 2 (two) times daily.  3 Inhaler  4   No current facility-administered medications for this visit.    Allergies:    Allergies  Allergen Reactions  . Pioglitazone     REACTION: cough  . Ramipril Cough    Social History:  The patient  reports that he has never smoked. He has never used smokeless tobacco. He reports that he drinks alcohol. He reports that he does not use illicit drugs.   Family History:  The patient's family history includes Breast cancer in his mother; COPD in his brother; Heart disease in his father; Pancreatic cancer in his sister.   ROS:  Please  see the history of present illness.      All other systems reviewed and negative.   PHYSICAL EXAM: VS:  BP 112/60  Pulse 60  Ht 5\' 8"  (1.727 m)  Wt 160 lb 3.2 oz (72.666 kg)  BMI 24.36 kg/m2 Well nourished, well developed, in no acute distress HEENT: normal Neck: no JVD Cardiac:  normal S1, S2; RRR; no murmur Lungs:  clear to auscultation bilaterally, no wheezing, rhonchi or rales Abd: soft, nontender, no hepatomegaly Ext: no edema Skin: warm and dry Neuro:  CNs 2-12 intact, no focal abnormalities noted  EKG:  NSR with first degree AV block and no ST changes  ASSESSMENT AND PLAN:  1. ASCAD with no angina - continue ASA/Plavix/Imdur  2. HTN well controlled - continue Coreg/Losartan  3. Dyslipidemia - LDL at goal - continue Fish Oil/Zetia/Crestor  4. Chronic diastolic CHF - continue lasix  - check BMET        5.  COPD - per PCP  Followup with me in 6 months       Signed, Fransico Him, MD [facility] 08/22/2014 9:42 AM

## 2014-08-22 NOTE — Patient Instructions (Signed)
Your physician recommends that you return for lab work TODAY (BMET)  Your physician wants you to follow-up in: [place].  You will receive a reminder letter in the mail two months in advance. If you don't receive a letter, please call our office to schedule the follow-up appointment.

## 2014-08-27 ENCOUNTER — Other Ambulatory Visit: Payer: Self-pay | Admitting: Cardiology

## 2014-08-29 ENCOUNTER — Other Ambulatory Visit (INDEPENDENT_AMBULATORY_CARE_PROVIDER_SITE_OTHER): Payer: Medicare Other | Admitting: *Deleted

## 2014-08-29 DIAGNOSIS — N289 Disorder of kidney and ureter, unspecified: Secondary | ICD-10-CM

## 2014-08-29 LAB — BASIC METABOLIC PANEL
BUN: 27 mg/dL — ABNORMAL HIGH (ref 6–23)
CHLORIDE: 109 meq/L (ref 96–112)
CO2: 22 mEq/L (ref 19–32)
CREATININE: 1.4 mg/dL (ref 0.4–1.5)
Calcium: 9 mg/dL (ref 8.4–10.5)
GFR: 51.81 mL/min — AB (ref >60.00)
Glucose, Bld: 104 mg/dL — ABNORMAL HIGH (ref 70–99)
POTASSIUM: 4.2 meq/L (ref 3.5–5.1)
Sodium: 137 mEq/L (ref 135–145)

## 2014-09-28 ENCOUNTER — Other Ambulatory Visit: Payer: Self-pay | Admitting: Cardiology

## 2014-11-04 ENCOUNTER — Other Ambulatory Visit: Payer: Self-pay | Admitting: Cardiology

## 2014-11-04 NOTE — Telephone Encounter (Signed)
Disp Refills Start End    carvedilol (COREG) 25 MG tablet 180 tablet 0 12/27/2013    Sig - Route:  Take 1 tablet (25 mg total) by mouth 2 (two) times daily with a meal. - Oral   Class:  Normal   Authorizing Provider:  Sueanne Margarita, MD   Ordering User:  Alcario Drought, CMA

## 2014-12-04 ENCOUNTER — Encounter: Payer: Self-pay | Admitting: Cardiology

## 2015-02-10 ENCOUNTER — Inpatient Hospital Stay (HOSPITAL_COMMUNITY)
Admission: EM | Admit: 2015-02-10 | Discharge: 2015-02-13 | DRG: 917 | Disposition: A | Discharge status: Skilled Nursing Facility | Payer: Medicare Other | Attending: Internal Medicine | Admitting: Internal Medicine

## 2015-02-10 ENCOUNTER — Emergency Department (HOSPITAL_COMMUNITY): Payer: Medicare Other

## 2015-02-10 ENCOUNTER — Encounter (HOSPITAL_COMMUNITY): Payer: Self-pay | Admitting: Emergency Medicine

## 2015-02-10 DIAGNOSIS — J705 Respiratory conditions due to smoke inhalation: Secondary | ICD-10-CM | POA: Diagnosis present

## 2015-02-10 DIAGNOSIS — J985 Diseases of mediastinum, not elsewhere classified: Secondary | ICD-10-CM | POA: Diagnosis not present

## 2015-02-10 DIAGNOSIS — J9601 Acute respiratory failure with hypoxia: Secondary | ICD-10-CM

## 2015-02-10 DIAGNOSIS — I5032 Chronic diastolic (congestive) heart failure: Secondary | ICD-10-CM | POA: Diagnosis not present

## 2015-02-10 DIAGNOSIS — Z7902 Long term (current) use of antithrombotics/antiplatelets: Secondary | ICD-10-CM

## 2015-02-10 DIAGNOSIS — J8410 Pulmonary fibrosis, unspecified: Secondary | ICD-10-CM | POA: Diagnosis present

## 2015-02-10 DIAGNOSIS — R4182 Altered mental status, unspecified: Secondary | ICD-10-CM | POA: Diagnosis present

## 2015-02-10 DIAGNOSIS — T5994XA Toxic effect of unspecified gases, fumes and vapors, undetermined, initial encounter: Secondary | ICD-10-CM

## 2015-02-10 DIAGNOSIS — IMO0002 Reserved for concepts with insufficient information to code with codable children: Secondary | ICD-10-CM

## 2015-02-10 DIAGNOSIS — I10 Essential (primary) hypertension: Secondary | ICD-10-CM | POA: Diagnosis not present

## 2015-02-10 DIAGNOSIS — K219 Gastro-esophageal reflux disease without esophagitis: Secondary | ICD-10-CM | POA: Diagnosis present

## 2015-02-10 DIAGNOSIS — I509 Heart failure, unspecified: Secondary | ICD-10-CM

## 2015-02-10 DIAGNOSIS — E1165 Type 2 diabetes mellitus with hyperglycemia: Secondary | ICD-10-CM | POA: Diagnosis present

## 2015-02-10 DIAGNOSIS — C61 Malignant neoplasm of prostate: Secondary | ICD-10-CM

## 2015-02-10 DIAGNOSIS — Y92009 Unspecified place in unspecified non-institutional (private) residence as the place of occurrence of the external cause: Secondary | ICD-10-CM

## 2015-02-10 DIAGNOSIS — Z79899 Other long term (current) drug therapy: Secondary | ICD-10-CM | POA: Diagnosis not present

## 2015-02-10 DIAGNOSIS — Z85828 Personal history of other malignant neoplasm of skin: Secondary | ICD-10-CM | POA: Diagnosis not present

## 2015-02-10 DIAGNOSIS — I252 Old myocardial infarction: Secondary | ICD-10-CM | POA: Diagnosis not present

## 2015-02-10 DIAGNOSIS — J449 Chronic obstructive pulmonary disease, unspecified: Secondary | ICD-10-CM | POA: Diagnosis present

## 2015-02-10 DIAGNOSIS — E7800 Pure hypercholesterolemia, unspecified: Secondary | ICD-10-CM

## 2015-02-10 DIAGNOSIS — N182 Chronic kidney disease, stage 2 (mild): Secondary | ICD-10-CM

## 2015-02-10 DIAGNOSIS — I2510 Atherosclerotic heart disease of native coronary artery without angina pectoris: Secondary | ICD-10-CM

## 2015-02-10 DIAGNOSIS — E785 Hyperlipidemia, unspecified: Secondary | ICD-10-CM | POA: Diagnosis present

## 2015-02-10 DIAGNOSIS — Z8546 Personal history of malignant neoplasm of prostate: Secondary | ICD-10-CM

## 2015-02-10 DIAGNOSIS — I129 Hypertensive chronic kidney disease with stage 1 through stage 4 chronic kidney disease, or unspecified chronic kidney disease: Secondary | ICD-10-CM | POA: Diagnosis present

## 2015-02-10 DIAGNOSIS — G9340 Encephalopathy, unspecified: Secondary | ICD-10-CM | POA: Diagnosis present

## 2015-02-10 DIAGNOSIS — R000 Tachycardia, unspecified: Secondary | ICD-10-CM | POA: Diagnosis present

## 2015-02-10 DIAGNOSIS — Z7982 Long term (current) use of aspirin: Secondary | ICD-10-CM | POA: Diagnosis not present

## 2015-02-10 DIAGNOSIS — T5994XD Toxic effect of unspecified gases, fumes and vapors, undetermined, subsequent encounter: Secondary | ICD-10-CM | POA: Diagnosis not present

## 2015-02-10 DIAGNOSIS — J9859 Other diseases of mediastinum, not elsewhere classified: Secondary | ICD-10-CM

## 2015-02-10 DIAGNOSIS — T59814A Toxic effect of smoke, undetermined, initial encounter: Secondary | ICD-10-CM | POA: Diagnosis present

## 2015-02-10 DIAGNOSIS — X021XXA Exposure to smoke in controlled fire in building or structure, initial encounter: Secondary | ICD-10-CM

## 2015-02-10 HISTORY — DX: Unspecified osteoarthritis, unspecified site: M19.90

## 2015-02-10 HISTORY — DX: Type 2 diabetes mellitus without complications: E11.9

## 2015-02-10 HISTORY — DX: Toxic effect of unspecified gases, fumes and vapors, undetermined, initial encounter: T59.94XA

## 2015-02-10 HISTORY — DX: Non-ST elevation (NSTEMI) myocardial infarction: I21.4

## 2015-02-10 LAB — I-STAT TROPONIN, ED: Troponin i, poc: 0 ng/mL (ref 0.00–0.08)

## 2015-02-10 LAB — I-STAT ARTERIAL BLOOD GAS, ED
ACID-BASE DEFICIT: 4 mmol/L — AB (ref 0.0–2.0)
BICARBONATE: 20.1 meq/L (ref 20.0–24.0)
O2 Saturation: 100 %
Patient temperature: 98
TCO2: 21 mmol/L (ref 0–100)
pCO2 arterial: 29.8 mmHg — ABNORMAL LOW (ref 35.0–45.0)
pH, Arterial: 7.436 (ref 7.350–7.450)
pO2, Arterial: 376 mmHg — ABNORMAL HIGH (ref 80.0–100.0)

## 2015-02-10 LAB — CBC WITH DIFFERENTIAL/PLATELET
Basophils Absolute: 0 10*3/uL (ref 0.0–0.1)
Basophils Relative: 0 % (ref 0–1)
Eosinophils Absolute: 0 10*3/uL (ref 0.0–0.7)
Eosinophils Relative: 1 % (ref 0–5)
HCT: 31.3 % — ABNORMAL LOW (ref 39.0–52.0)
Hemoglobin: 10.5 g/dL — ABNORMAL LOW (ref 13.0–17.0)
LYMPHS PCT: 6 % — AB (ref 12–46)
Lymphs Abs: 0.3 10*3/uL — ABNORMAL LOW (ref 0.7–4.0)
MCH: 30.4 pg (ref 26.0–34.0)
MCHC: 33.5 g/dL (ref 30.0–36.0)
MCV: 90.7 fL (ref 78.0–100.0)
MONO ABS: 0.3 10*3/uL (ref 0.1–1.0)
Monocytes Relative: 5 % (ref 3–12)
NEUTROS ABS: 5 10*3/uL (ref 1.7–7.7)
Neutrophils Relative %: 88 % — ABNORMAL HIGH (ref 43–77)
PLATELETS: 239 10*3/uL (ref 150–400)
RBC: 3.45 MIL/uL — ABNORMAL LOW (ref 4.22–5.81)
RDW: 15.3 % (ref 11.5–15.5)
WBC: 5.7 10*3/uL (ref 4.0–10.5)

## 2015-02-10 LAB — CARBOXYHEMOGLOBIN
Carboxyhemoglobin: 1.9 % — ABNORMAL HIGH (ref 0.5–1.5)
METHEMOGLOBIN: 1 % (ref 0.0–1.5)
O2 SAT: 97.9 %
Total hemoglobin: 9.3 g/dL — ABNORMAL LOW (ref 13.5–18.0)

## 2015-02-10 LAB — I-STAT CG4 LACTIC ACID, ED: LACTIC ACID, VENOUS: 1.59 mmol/L (ref 0.5–2.0)

## 2015-02-10 LAB — I-STAT CHEM 8, ED
BUN: 44 mg/dL — AB (ref 6–23)
CHLORIDE: 110 mmol/L (ref 96–112)
CREATININE: 1.4 mg/dL — AB (ref 0.50–1.35)
Calcium, Ion: 1.16 mmol/L (ref 1.13–1.30)
GLUCOSE: 209 mg/dL — AB (ref 70–99)
HCT: 31 % — ABNORMAL LOW (ref 39.0–52.0)
Hemoglobin: 10.5 g/dL — ABNORMAL LOW (ref 13.0–17.0)
POTASSIUM: 4.8 mmol/L (ref 3.5–5.1)
Sodium: 141 mmol/L (ref 135–145)
TCO2: 19 mmol/L (ref 0–100)

## 2015-02-10 LAB — COMPREHENSIVE METABOLIC PANEL
ALT: 14 U/L (ref 0–53)
ANION GAP: 10 (ref 5–15)
AST: 26 U/L (ref 0–37)
Albumin: 3.2 g/dL — ABNORMAL LOW (ref 3.5–5.2)
Alkaline Phosphatase: 75 U/L (ref 39–117)
BUN: 32 mg/dL — AB (ref 6–23)
CALCIUM: 9 mg/dL (ref 8.4–10.5)
CO2: 22 mmol/L (ref 19–32)
CREATININE: 1.39 mg/dL — AB (ref 0.50–1.35)
Chloride: 109 mmol/L (ref 96–112)
GFR calc Af Amer: 52 mL/min — ABNORMAL LOW (ref >90)
GFR calc non Af Amer: 45 mL/min — ABNORMAL LOW (ref >90)
Glucose, Bld: 205 mg/dL — ABNORMAL HIGH (ref 70–99)
Potassium: 4.7 mmol/L (ref 3.5–5.1)
Sodium: 141 mmol/L (ref 135–145)
TOTAL PROTEIN: 6.6 g/dL (ref 6.0–8.3)
Total Bilirubin: 1.4 mg/dL — ABNORMAL HIGH (ref 0.3–1.2)

## 2015-02-10 LAB — BRAIN NATRIURETIC PEPTIDE: B Natriuretic Peptide: 514.5 pg/mL — ABNORMAL HIGH (ref 0.0–100.0)

## 2015-02-10 LAB — I-STAT VENOUS BLOOD GAS, ED
ACID-BASE DEFICIT: 6 mmol/L — AB (ref 0.0–2.0)
BICARBONATE: 20 meq/L (ref 20.0–24.0)
O2 Saturation: 48 %
PH VEN: 7.312 — AB (ref 7.250–7.300)
TCO2: 21 mmol/L (ref 0–100)
pCO2, Ven: 39.5 mmHg — ABNORMAL LOW (ref 45.0–50.0)
pO2, Ven: 28 mmHg — CL (ref 30.0–45.0)

## 2015-02-10 LAB — CBG MONITORING, ED: GLUCOSE-CAPILLARY: 199 mg/dL — AB (ref 70–99)

## 2015-02-10 LAB — GLUCOSE, CAPILLARY: GLUCOSE-CAPILLARY: 221 mg/dL — AB (ref 70–99)

## 2015-02-10 LAB — MRSA PCR SCREENING: MRSA by PCR: NEGATIVE

## 2015-02-10 MED ORDER — ACETAMINOPHEN 650 MG RE SUPP: 650.0000 mg | Freq: Four times a day (QID) | RECTAL | Status: DC | PRN

## 2015-02-10 MED ORDER — LOSARTAN POTASSIUM 25 MG PO TABS
25.0000 mg | ORAL_TABLET | Freq: Every day | ORAL | Status: DC
  Administered 2015-02-10: 25 mg via ORAL
  Filled 2015-02-10 (×2): qty 1

## 2015-02-10 MED ORDER — FUROSEMIDE 10 MG/ML IJ SOLN
20.0000 mg | Freq: Once | INTRAMUSCULAR | Status: AC
  Administered 2015-02-10: 20 mg via INTRAVENOUS
  Filled 2015-02-10: qty 2

## 2015-02-10 MED ORDER — METHYLPREDNISOLONE SODIUM SUCC 125 MG IJ SOLR
60.0000 mg | Freq: Two times a day (BID) | INTRAMUSCULAR | Status: AC
  Administered 2015-02-10 – 2015-02-11 (×2): 60 mg via INTRAVENOUS
  Filled 2015-02-10: qty 0.96
  Filled 2015-02-10: qty 2
  Filled 2015-02-10: qty 0.96
  Filled 2015-02-10: qty 2

## 2015-02-10 MED ORDER — CARVEDILOL 25 MG PO TABS
25.0000 mg | ORAL_TABLET | Freq: Two times a day (BID) | ORAL | Status: DC
  Administered 2015-02-10: 25 mg via ORAL
  Filled 2015-02-10 (×4): qty 1

## 2015-02-10 MED ORDER — LORAZEPAM 2 MG/ML IJ SOLN: 0.5000 mg | Freq: Four times a day (QID) | INTRAMUSCULAR | Status: DC | PRN

## 2015-02-10 MED ORDER — FUROSEMIDE 20 MG PO TABS
20.0000 mg | ORAL_TABLET | Freq: Every day | ORAL | Status: DC
  Administered 2015-02-10: 20 mg via ORAL
  Filled 2015-02-10 (×2): qty 1

## 2015-02-10 MED ORDER — CETYLPYRIDINIUM CHLORIDE 0.05 % MT LIQD
7.0000 mL | Freq: Two times a day (BID) | OROMUCOSAL | Status: DC
  Administered 2015-02-10 – 2015-02-13 (×6): 7 mL via OROMUCOSAL

## 2015-02-10 MED ORDER — ISOSORBIDE MONONITRATE ER 60 MG PO TB24
60.0000 mg | ORAL_TABLET | Freq: Every day | ORAL | Status: DC
  Administered 2015-02-10: 60 mg via ORAL
  Filled 2015-02-10 (×2): qty 1

## 2015-02-10 MED ORDER — INSULIN ASPART 100 UNIT/ML ~~LOC~~ SOLN
0.0000 [IU] | Freq: Every day | SUBCUTANEOUS | Status: DC
  Administered 2015-02-10: 2 [IU] via SUBCUTANEOUS

## 2015-02-10 MED ORDER — ROSUVASTATIN CALCIUM 10 MG PO TABS
10.0000 mg | ORAL_TABLET | Freq: Every day | ORAL | Status: DC
  Administered 2015-02-10 – 2015-02-13 (×4): 10 mg via ORAL
  Filled 2015-02-10 (×4): qty 1

## 2015-02-10 MED ORDER — EZETIMIBE 10 MG PO TABS
10.0000 mg | ORAL_TABLET | Freq: Every day | ORAL | Status: DC
  Administered 2015-02-10 – 2015-02-13 (×4): 10 mg via ORAL
  Filled 2015-02-10 (×4): qty 1

## 2015-02-10 MED ORDER — ONDANSETRON HCL 4 MG PO TABS: 4.0000 mg | ORAL_TABLET | Freq: Four times a day (QID) | ORAL | Status: DC | PRN

## 2015-02-10 MED ORDER — INSULIN ASPART 100 UNIT/ML ~~LOC~~ SOLN
0.0000 [IU] | Freq: Three times a day (TID) | SUBCUTANEOUS | Status: DC
  Administered 2015-02-11 (×3): 2 [IU] via SUBCUTANEOUS
  Administered 2015-02-12 (×2): 1 [IU] via SUBCUTANEOUS

## 2015-02-10 MED ORDER — CLOPIDOGREL BISULFATE 75 MG PO TABS
75.0000 mg | ORAL_TABLET | Freq: Every day | ORAL | Status: DC
  Administered 2015-02-10 – 2015-02-13 (×4): 75 mg via ORAL
  Filled 2015-02-10 (×4): qty 1

## 2015-02-10 MED ORDER — OXYCODONE HCL 5 MG PO TABS: 5.0000 mg | ORAL_TABLET | ORAL | Status: DC | PRN

## 2015-02-10 MED ORDER — OMEGA-3-ACID ETHYL ESTERS 1 G PO CAPS
1.0000 g | ORAL_CAPSULE | Freq: Two times a day (BID) | ORAL | Status: DC
  Administered 2015-02-10 – 2015-02-13 (×6): 1 g via ORAL
  Filled 2015-02-10 (×7): qty 1

## 2015-02-10 MED ORDER — SODIUM CHLORIDE 0.9 % IJ SOLN
3.0000 mL | Freq: Two times a day (BID) | INTRAMUSCULAR | Status: DC
  Administered 2015-02-10 – 2015-02-13 (×5): 3 mL via INTRAVENOUS

## 2015-02-10 MED ORDER — HEPARIN SODIUM (PORCINE) 5000 UNIT/ML IJ SOLN
5000.0000 [IU] | Freq: Three times a day (TID) | INTRAMUSCULAR | Status: DC
  Administered 2015-02-10 – 2015-02-13 (×9): 5000 [IU] via SUBCUTANEOUS
  Filled 2015-02-10 (×11): qty 1

## 2015-02-10 MED ORDER — ONDANSETRON HCL 4 MG/2ML IJ SOLN: 4.0000 mg | Freq: Four times a day (QID) | INTRAMUSCULAR | Status: DC | PRN

## 2015-02-10 MED ORDER — ACETAMINOPHEN 325 MG PO TABS: 650.0000 mg | ORAL_TABLET | Freq: Four times a day (QID) | ORAL | Status: DC | PRN

## 2015-02-10 MED ORDER — ASPIRIN EC 81 MG PO TBEC
81.0000 mg | DELAYED_RELEASE_TABLET | Freq: Every day | ORAL | Status: DC
  Administered 2015-02-11 – 2015-02-13 (×3): 81 mg via ORAL
  Filled 2015-02-10 (×4): qty 1

## 2015-02-10 MED ORDER — IPRATROPIUM-ALBUTEROL 0.5-2.5 (3) MG/3ML IN SOLN: 3.0000 mL | RESPIRATORY_TRACT | Status: DC | PRN

## 2015-02-10 MED ORDER — GLIMEPIRIDE 1 MG PO TABS
1.0000 mg | ORAL_TABLET | Freq: Every day | ORAL | Status: DC
  Administered 2015-02-11 – 2015-02-13 (×3): 1 mg via ORAL
  Filled 2015-02-10 (×5): qty 1

## 2015-02-10 MED ORDER — POTASSIUM CHLORIDE ER 10 MEQ PO TBCR
10.0000 meq | EXTENDED_RELEASE_TABLET | ORAL | Status: DC
  Administered 2015-02-11: 10 meq via ORAL
  Filled 2015-02-10: qty 1
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
## 2015-02-10 NOTE — ED Notes (Signed)
Admitting team at bedside.
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
## 2015-02-10 NOTE — ED Notes (Signed)
Pt switched from NRB to [place] at 5L by Dr.Docherty. Pt maintaining sats at 98%

## 2015-02-10 NOTE — ED Notes (Signed)
Meal tray ordered 
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
## 2015-02-10 NOTE — ED Notes (Signed)
Pt monitored by pulse ox, bp cuff, and 12-lead. 

## 2015-02-10 NOTE — Progress Notes (Signed)
Pt off NRB and on 4L [place] per ED MD.

## 2015-02-11 ENCOUNTER — Encounter (HOSPITAL_COMMUNITY): Payer: Self-pay | Admitting: General Practice

## 2015-02-11 DIAGNOSIS — N182 Chronic kidney disease, stage 2 (mild): Secondary | ICD-10-CM

## 2015-02-11 DIAGNOSIS — J985 Diseases of mediastinum, not elsewhere classified: Secondary | ICD-10-CM

## 2015-02-11 DIAGNOSIS — T5994XD Toxic effect of unspecified gases, fumes and vapors, undetermined, subsequent encounter: Secondary | ICD-10-CM

## 2015-02-11 DIAGNOSIS — J9601 Acute respiratory failure with hypoxia: Secondary | ICD-10-CM

## 2015-02-11 DIAGNOSIS — T5994XA Toxic effect of unspecified gases, fumes and vapors, undetermined, initial encounter: Secondary | ICD-10-CM

## 2015-02-11 HISTORY — DX: Toxic effect of unspecified gases, fumes and vapors, undetermined, initial encounter: T59.94XA

## 2015-02-11 LAB — CBC
HEMATOCRIT: 24.8 % — AB (ref 39.0–52.0)
Hemoglobin: 8.5 g/dL — ABNORMAL LOW (ref 13.0–17.0)
MCH: 30.7 pg (ref 26.0–34.0)
MCHC: 34.3 g/dL (ref 30.0–36.0)
MCV: 89.5 fL (ref 78.0–100.0)
Platelets: 195 10*3/uL (ref 150–400)
RBC: 2.77 MIL/uL — AB (ref 4.22–5.81)
RDW: 15 % (ref 11.5–15.5)
WBC: 2.7 10*3/uL — ABNORMAL LOW (ref 4.0–10.5)

## 2015-02-11 LAB — COMPREHENSIVE METABOLIC PANEL
ALT: 13 U/L (ref 0–53)
ANION GAP: 9 (ref 5–15)
AST: 14 U/L (ref 0–37)
Albumin: 2.7 g/dL — ABNORMAL LOW (ref 3.5–5.2)
Alkaline Phosphatase: 61 U/L (ref 39–117)
BUN: 35 mg/dL — ABNORMAL HIGH (ref 6–23)
CO2: 21 mmol/L (ref 19–32)
CREATININE: 1.26 mg/dL (ref 0.50–1.35)
Calcium: 8.6 mg/dL (ref 8.4–10.5)
Chloride: 106 mmol/L (ref 96–112)
GFR calc non Af Amer: 51 mL/min — ABNORMAL LOW (ref >90)
GFR, EST AFRICAN AMERICAN: 59 mL/min — AB (ref >90)
Glucose, Bld: 254 mg/dL — ABNORMAL HIGH (ref 70–99)
Potassium: 3.8 mmol/L (ref 3.5–5.1)
SODIUM: 136 mmol/L (ref 135–145)
TOTAL PROTEIN: 5.6 g/dL — AB (ref 6.0–8.3)
Total Bilirubin: 0.9 mg/dL (ref 0.3–1.2)

## 2015-02-11 LAB — GLUCOSE, CAPILLARY
GLUCOSE-CAPILLARY: 197 mg/dL — AB (ref 70–99)
GLUCOSE-CAPILLARY: 151 mg/dL — AB (ref 70–99)
Glucose-Capillary: 211 mg/dL — ABNORMAL HIGH (ref 70–99)
Glucose-Capillary: 199 mg/dL — ABNORMAL HIGH (ref 70–99)
Glucose-Capillary: 176 mg/dL — ABNORMAL HIGH (ref 70–99)

## 2015-02-11 MED ORDER — ISOSORBIDE MONONITRATE ER 60 MG PO TB24
60.0000 mg | ORAL_TABLET | Freq: Every day | ORAL | Status: DC
  Administered 2015-02-12: 60 mg via ORAL
  Filled 2015-02-11: qty 1

## 2015-02-11 MED ORDER — ISOSORBIDE MONONITRATE ER 30 MG PO TB24: 30.0000 mg | ORAL_TABLET | Freq: Every day | ORAL | Status: DC

## 2015-02-11 MED ORDER — FUROSEMIDE 10 MG/ML IJ SOLN
40.0000 mg | Freq: Every day | INTRAMUSCULAR | Status: DC
  Administered 2015-02-11: 40 mg via INTRAVENOUS
  Filled 2015-02-11 (×2): qty 4

## 2015-02-11 MED ORDER — FUROSEMIDE 20 MG PO TABS: 20.0000 mg | ORAL_TABLET | Freq: Every day | ORAL | Status: DC

## 2015-02-11 MED ORDER — ALBUTEROL SULFATE HFA 108 (90 BASE) MCG/ACT IN AERS: 2.0000 | INHALATION_SPRAY | Freq: Four times a day (QID) | RESPIRATORY_TRACT | Status: DC | PRN

## 2015-02-11 MED ORDER — CARVEDILOL 25 MG PO TABS
25.0000 mg | ORAL_TABLET | Freq: Two times a day (BID) | ORAL | Status: DC
  Administered 2015-02-12 – 2015-02-13 (×3): 25 mg via ORAL
  Filled 2015-02-11 (×5): qty 1

## 2015-02-11 MED ORDER — ISOSORBIDE MONONITRATE ER 60 MG PO TB24: 60.0000 mg | ORAL_TABLET | Freq: Every day | ORAL | Status: DC

## 2015-02-11 MED ORDER — CARVEDILOL 12.5 MG PO TABS
12.5000 mg | ORAL_TABLET | Freq: Two times a day (BID) | ORAL | Status: AC
  Administered 2015-02-11: 12.5 mg via ORAL
  Filled 2015-02-11 (×3): qty 1

## 2015-02-11 MED ORDER — POTASSIUM CHLORIDE ER 10 MEQ PO TBCR
10.0000 meq | EXTENDED_RELEASE_TABLET | Freq: Every day | ORAL | Status: DC
Start: 2015-02-12 — End: 2015-02-13
  Administered 2015-02-12 – 2015-02-13 (×2): 10 meq via ORAL
  Filled 2015-02-11 (×2): qty 1

## 2015-02-11 MED ORDER — FUROSEMIDE 20 MG PO TABS
20.0000 mg | ORAL_TABLET | Freq: Every day | ORAL | Status: DC
  Administered 2015-02-12 – 2015-02-13 (×2): 20 mg via ORAL
  Filled 2015-02-11 (×2): qty 1
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
## 2015-02-11 NOTE — Progress Notes (Signed)
02/11/2015 Beth Rickenbaugh (RN) [facility] called at [phone] concerning patient, wanted to know vital signs and plan for patient. Rico Sheehan RN

## 2015-02-11 NOTE — Progress Notes (Signed)
02/11/2015 patient transfer from 2c to 5w33 via wheelchair he was alert and continue to be stable during my shift. [facility] RN.
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
## 2015-02-12 DIAGNOSIS — I5032 Chronic diastolic (congestive) heart failure: Secondary | ICD-10-CM

## 2015-02-12 DIAGNOSIS — T5994XA Toxic effect of unspecified gases, fumes and vapors, undetermined, initial encounter: Secondary | ICD-10-CM

## 2015-02-12 DIAGNOSIS — I10 Essential (primary) hypertension: Secondary | ICD-10-CM

## 2015-02-12 LAB — GLUCOSE, CAPILLARY
GLUCOSE-CAPILLARY: 128 mg/dL — AB (ref 70–99)
GLUCOSE-CAPILLARY: 75 mg/dL (ref 70–99)
GLUCOSE-CAPILLARY: 61 mg/dL — AB (ref 70–99)
GLUCOSE-CAPILLARY: 85 mg/dL (ref 70–99)
Glucose-Capillary: 122 mg/dL — ABNORMAL HIGH (ref 70–99)
Glucose-Capillary: 63 mg/dL — ABNORMAL LOW (ref 70–99)

## 2015-02-12 LAB — CBC
HCT: 26.8 % — ABNORMAL LOW (ref 39.0–52.0)
Hemoglobin: 9.2 g/dL — ABNORMAL LOW (ref 13.0–17.0)
MCH: 30.4 pg (ref 26.0–34.0)
MCHC: 34.3 g/dL (ref 30.0–36.0)
MCV: 88.4 fL (ref 78.0–100.0)
PLATELETS: 228 10*3/uL (ref 150–400)
RBC: 3.03 MIL/uL — ABNORMAL LOW (ref 4.22–5.81)
RDW: 15.1 % (ref 11.5–15.5)
WBC: 5.3 10*3/uL (ref 4.0–10.5)

## 2015-02-12 LAB — BASIC METABOLIC PANEL
Anion gap: 8 (ref 5–15)
BUN: 46 mg/dL — AB (ref 6–23)
CO2: 23 mmol/L (ref 19–32)
CREATININE: 1.27 mg/dL (ref 0.50–1.35)
Calcium: 9.1 mg/dL (ref 8.4–10.5)
Chloride: 106 mmol/L (ref 96–112)
GFR calc Af Amer: 59 mL/min — ABNORMAL LOW (ref >90)
GFR, EST NON AFRICAN AMERICAN: 50 mL/min — AB (ref >90)
GLUCOSE: 142 mg/dL — AB (ref 70–99)
Potassium: 4.4 mmol/L (ref 3.5–5.1)
Sodium: 137 mmol/L (ref 135–145)

## 2015-02-12 LAB — HEMOGLOBIN A1C
HEMOGLOBIN A1C: 5.3 % (ref 4.8–5.6)
MEAN PLASMA GLUCOSE: 105 mg/dL

## 2015-02-12 MED ORDER — ISOSORBIDE MONONITRATE ER 60 MG PO TB24
60.0000 mg | ORAL_TABLET | Freq: Every day | ORAL | Status: DC
  Administered 2015-02-13: 60 mg via ORAL
  Filled 2015-02-12: qty 1
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
## 2015-02-13 LAB — GLUCOSE, CAPILLARY
GLUCOSE-CAPILLARY: 63 mg/dL — AB (ref 70–99)
GLUCOSE-CAPILLARY: 67 mg/dL — AB (ref 70–99)
Glucose-Capillary: 92 mg/dL (ref 70–99)
Glucose-Capillary: 90 mg/dL (ref 70–99)

## 2015-02-13 NOTE — Progress Notes (Signed)
[name] (Family Friend) going to facility to fill out paperwork now for patient. Facility will not send transport for patient until this is completed. Patient will be leaving sometime later today by EMS, family member given my personal phone number in case of any questions or issues.
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
## 2015-02-13 NOTE — Clinical Social Work Note (Signed)
Clinical Social Worker facilitated patient discharge including contacting patient family and facility to confirm patient discharge plans.  Clinical information faxed to facility and family agreeable with plan.  CSW arranged ambulance transport via [facility] to [facility].  RN to call report prior to discharge.  Clinical Social Worker will sign off for now as social work intervention is no longer needed. Please consult [place] again if new need arises.  Barbette Or, [place]

## 2015-02-13 NOTE — Progress Notes (Signed)
Report called to [place] LPN at [facility], answered all questions about patient per report.

## 2015-02-13 NOTE — Progress Notes (Signed)
Assisted patient w/execution of healthcare power of attorney. CSW Janett Billow was present to notarize w/witnesses present. Ernest Haber Chaplain   02/13/15 1200  Clinical Encounter Type  Visited With Patient and family together

## 2015-02-13 NOTE — Clinical Social Work Placement (Signed)
Clinical Social Work Department CLINICAL SOCIAL WORK PLACEMENT NOTE 02/13/2015  Patient:  [name], [name]  Account Number:  [id number] Admit date:  02/10/2015  Clinical Social Worker:  Barbette Or, LCSW  Date/time:  02/13/2015 02:30 PM  Clinical Social Work is seeking post-discharge placement for this patient at the following level of care:   SKILLED NURSING   (*CSW will update this form in Epic as items are completed)   02/13/2015  Patient/family provided with [place] Department of Clinical Social Work's list of facilities offering this level of care within the geographic area requested by the patient (or if unable, by the patient's family).  02/13/2015  Patient/family informed of their freedom to choose among providers that offer the needed level of care, that participate in Medicare, Medicaid or managed care program needed by the patient, have an available bed and are willing to accept the patient.  02/13/2015  Patient/family informed of [facility]' ownership interest in [facility], as well as of the fact that they are under no obligation to receive care at this facility.  PASARR submitted to EDS on 02/13/2015 PASARR number received on 02/13/2015  FL2 transmitted to all facilities in geographic area requested by pt/family on  02/13/2015 FL2 transmitted to all facilities within larger geographic area on   Patient informed that his/her managed care company has contracts with or will negotiate with  certain facilities, including the following:     Patient/family informed of bed offers received:  02/13/2015 Patient chooses bed at [facility] Physician recommends and patient chooses bed at    Patient to be transferred to [place] on  02/13/2015 Patient to be transferred to facility by [facility] Patient and family notified of transfer on 02/13/2015 Name of family member notified:  [place]  The following physician request were entered in Epic:   Additional Comments:

## 2015-02-13 NOTE — Progress Notes (Signed)
NURSING PROGRESS NOTE  JOHANN SANTONE [id number] Discharge Data: 02/13/2015 3:36 PM Attending Provider: No att. providers found PCP: Melinda Crutch, MD   Manus Rudd to be D/C'd SNIF per MD order.    All IV's will be discontinued and monitored for bleeding.  All belongings will be returned to patient for patient to take home. Ambulance pickup @1530 , packet with all paperwork given to EMS staff.  Last Documented Vital Signs:  Blood pressure 120/49, pulse 62, temperature 97.7 F (36.5 C), temperature source Oral, resp. rate 16, height 5\' 6"  (1.676 m), weight 66.497 kg (146 lb 9.6 oz), SpO2 99 %.  Hendricks Limes RN, BS, BSN
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
## 2015-02-20 ENCOUNTER — Ambulatory Visit: Payer: PRIVATE HEALTH INSURANCE | Admitting: Cardiology

## 2015-03-15 ENCOUNTER — Emergency Department (HOSPITAL_COMMUNITY): Payer: Medicare Other

## 2015-03-15 ENCOUNTER — Encounter (HOSPITAL_COMMUNITY): Payer: Self-pay | Admitting: Emergency Medicine

## 2015-03-15 ENCOUNTER — Inpatient Hospital Stay (HOSPITAL_COMMUNITY)
Admission: EM | Admit: 2015-03-15 | Discharge: 2015-03-18 | DRG: 689 | Disposition: A | Discharge status: Nursing Home (Includes ICF) | Payer: Medicare Other | Attending: Internal Medicine | Admitting: Internal Medicine

## 2015-03-15 DIAGNOSIS — I10 Essential (primary) hypertension: Secondary | ICD-10-CM | POA: Diagnosis present

## 2015-03-15 DIAGNOSIS — Z7982 Long term (current) use of aspirin: Secondary | ICD-10-CM

## 2015-03-15 DIAGNOSIS — F0390 Unspecified dementia without behavioral disturbance: Secondary | ICD-10-CM | POA: Diagnosis present

## 2015-03-15 DIAGNOSIS — I5032 Chronic diastolic (congestive) heart failure: Secondary | ICD-10-CM

## 2015-03-15 DIAGNOSIS — J438 Other emphysema: Secondary | ICD-10-CM | POA: Diagnosis not present

## 2015-03-15 DIAGNOSIS — I252 Old myocardial infarction: Secondary | ICD-10-CM

## 2015-03-15 DIAGNOSIS — N182 Chronic kidney disease, stage 2 (mild): Secondary | ICD-10-CM | POA: Diagnosis present

## 2015-03-15 DIAGNOSIS — G9349 Other encephalopathy: Secondary | ICD-10-CM | POA: Diagnosis present

## 2015-03-15 DIAGNOSIS — E1165 Type 2 diabetes mellitus with hyperglycemia: Secondary | ICD-10-CM

## 2015-03-15 DIAGNOSIS — Z955 Presence of coronary angioplasty implant and graft: Secondary | ICD-10-CM

## 2015-03-15 DIAGNOSIS — E785 Hyperlipidemia, unspecified: Secondary | ICD-10-CM | POA: Diagnosis present

## 2015-03-15 DIAGNOSIS — K219 Gastro-esophageal reflux disease without esophagitis: Secondary | ICD-10-CM | POA: Diagnosis present

## 2015-03-15 DIAGNOSIS — R410 Disorientation, unspecified: Secondary | ICD-10-CM | POA: Diagnosis not present

## 2015-03-15 DIAGNOSIS — N179 Acute kidney failure, unspecified: Secondary | ICD-10-CM | POA: Diagnosis present

## 2015-03-15 DIAGNOSIS — B9689 Other specified bacterial agents as the cause of diseases classified elsewhere: Secondary | ICD-10-CM | POA: Diagnosis present

## 2015-03-15 DIAGNOSIS — I255 Ischemic cardiomyopathy: Secondary | ICD-10-CM | POA: Diagnosis present

## 2015-03-15 DIAGNOSIS — J449 Chronic obstructive pulmonary disease, unspecified: Secondary | ICD-10-CM | POA: Diagnosis present

## 2015-03-15 DIAGNOSIS — N390 Urinary tract infection, site not specified: Secondary | ICD-10-CM | POA: Diagnosis not present

## 2015-03-15 DIAGNOSIS — G9340 Encephalopathy, unspecified: Secondary | ICD-10-CM | POA: Diagnosis present

## 2015-03-15 DIAGNOSIS — I2510 Atherosclerotic heart disease of native coronary artery without angina pectoris: Secondary | ICD-10-CM | POA: Diagnosis present

## 2015-03-15 DIAGNOSIS — E119 Type 2 diabetes mellitus without complications: Secondary | ICD-10-CM | POA: Diagnosis present

## 2015-03-15 DIAGNOSIS — Z7902 Long term (current) use of antithrombotics/antiplatelets: Secondary | ICD-10-CM

## 2015-03-15 DIAGNOSIS — Z825 Family history of asthma and other chronic lower respiratory diseases: Secondary | ICD-10-CM

## 2015-03-15 DIAGNOSIS — IMO0002 Reserved for concepts with insufficient information to code with codable children: Secondary | ICD-10-CM

## 2015-03-15 DIAGNOSIS — Z85828 Personal history of other malignant neoplasm of skin: Secondary | ICD-10-CM

## 2015-03-15 DIAGNOSIS — I129 Hypertensive chronic kidney disease with stage 1 through stage 4 chronic kidney disease, or unspecified chronic kidney disease: Secondary | ICD-10-CM | POA: Diagnosis present

## 2015-03-15 DIAGNOSIS — R0602 Shortness of breath: Secondary | ICD-10-CM

## 2015-03-15 DIAGNOSIS — Z79899 Other long term (current) drug therapy: Secondary | ICD-10-CM

## 2015-03-15 DIAGNOSIS — Z8546 Personal history of malignant neoplasm of prostate: Secondary | ICD-10-CM

## 2015-03-15 LAB — URINALYSIS, ROUTINE W REFLEX MICROSCOPIC
BILIRUBIN URINE: NEGATIVE
Glucose, UA: NEGATIVE mg/dL
KETONES UR: NEGATIVE mg/dL
NITRITE: POSITIVE — AB
Protein, ur: 30 mg/dL — AB
Specific Gravity, Urine: 1.014 (ref 1.005–1.030)
UROBILINOGEN UA: 1 mg/dL (ref 0.0–1.0)
pH: 5 (ref 5.0–8.0)

## 2015-03-15 LAB — I-STAT TROPONIN, ED: TROPONIN I, POC: 0 ng/mL (ref 0.00–0.08)

## 2015-03-15 LAB — CBC
HEMATOCRIT: 29.9 % — AB (ref 39.0–52.0)
HEMOGLOBIN: 10 g/dL — AB (ref 13.0–17.0)
MCH: 30.1 pg (ref 26.0–34.0)
MCHC: 33.4 g/dL (ref 30.0–36.0)
MCV: 90.1 fL (ref 78.0–100.0)
Platelets: 210 10*3/uL (ref 150–400)
RBC: 3.32 MIL/uL — AB (ref 4.22–5.81)
RDW: 16 % — ABNORMAL HIGH (ref 11.5–15.5)
WBC: 6.9 10*3/uL (ref 4.0–10.5)

## 2015-03-15 LAB — BASIC METABOLIC PANEL
Anion gap: 9 (ref 5–15)
BUN: 27 mg/dL — ABNORMAL HIGH (ref 6–20)
CO2: 22 mmol/L (ref 22–32)
Calcium: 8.7 mg/dL — ABNORMAL LOW (ref 8.9–10.3)
Chloride: 103 mmol/L (ref 101–111)
Creatinine, Ser: 1.59 mg/dL — ABNORMAL HIGH (ref 0.61–1.24)
GFR calc Af Amer: 45 mL/min — ABNORMAL LOW (ref >60)
GFR, EST NON AFRICAN AMERICAN: 38 mL/min — AB (ref >60)
Glucose, Bld: 187 mg/dL — ABNORMAL HIGH (ref 70–99)
POTASSIUM: 4.8 mmol/L (ref 3.5–5.1)
SODIUM: 134 mmol/L — AB (ref 135–145)

## 2015-03-15 LAB — I-STAT CG4 LACTIC ACID, ED: LACTIC ACID, VENOUS: 0.65 mmol/L (ref 0.5–2.0)

## 2015-03-15 LAB — URINE MICROSCOPIC-ADD ON

## 2015-03-15 LAB — BRAIN NATRIURETIC PEPTIDE: B Natriuretic Peptide: 304.4 pg/mL — ABNORMAL HIGH (ref 0.0–100.0)

## 2015-03-15 LAB — LACTIC ACID, PLASMA: Lactic Acid, Venous: 0.8 mmol/L (ref 0.5–2.0)

## 2015-03-15 MED ORDER — ACETAMINOPHEN 325 MG PO TABS
650.0000 mg | ORAL_TABLET | Freq: Once | ORAL | Status: AC
  Administered 2015-03-15: 650 mg via ORAL
  Filled 2015-03-15: qty 2

## 2015-03-15 MED ORDER — DEXTROSE 5 % IV SOLN
1.0000 g | INTRAVENOUS | Status: DC
  Administered 2015-03-15 – 2015-03-17 (×3): 1 g via INTRAVENOUS
  Filled 2015-03-15 (×4): qty 10

## 2015-03-15 MED ORDER — IPRATROPIUM-ALBUTEROL 0.5-2.5 (3) MG/3ML IN SOLN
3.0000 mL | RESPIRATORY_TRACT | Status: DC
  Administered 2015-03-15: 3 mL via RESPIRATORY_TRACT
  Filled 2015-03-15: qty 3

## 2015-03-15 NOTE — ED Notes (Signed)
Transported to radiology 
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
## 2015-03-15 NOTE — ED Notes (Signed)
Pt co shortness of breath onset yesterday.
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
## 2015-03-15 NOTE — ED Notes (Signed)
Pt taken off o2- Attempted to get pt up to walk. Pt sat up on side of bed and became more sob. o2 sats dropped to 90-93%. Pt sts he was too uncomfortable to try to walk d/t balance.  Pt placed back in bed with o2.

## 2015-03-15 NOTE — ED Notes (Signed)
MD at bedside. 

## 2015-03-15 NOTE — ED Notes (Signed)
Pt transported to radiology.

## 2015-03-16 DIAGNOSIS — I129 Hypertensive chronic kidney disease with stage 1 through stage 4 chronic kidney disease, or unspecified chronic kidney disease: Secondary | ICD-10-CM | POA: Diagnosis present

## 2015-03-16 DIAGNOSIS — I5032 Chronic diastolic (congestive) heart failure: Secondary | ICD-10-CM | POA: Diagnosis present

## 2015-03-16 DIAGNOSIS — G9349 Other encephalopathy: Secondary | ICD-10-CM | POA: Diagnosis present

## 2015-03-16 DIAGNOSIS — Z79899 Other long term (current) drug therapy: Secondary | ICD-10-CM | POA: Diagnosis not present

## 2015-03-16 DIAGNOSIS — Z8546 Personal history of malignant neoplasm of prostate: Secondary | ICD-10-CM | POA: Diagnosis not present

## 2015-03-16 DIAGNOSIS — Z825 Family history of asthma and other chronic lower respiratory diseases: Secondary | ICD-10-CM | POA: Diagnosis not present

## 2015-03-16 DIAGNOSIS — N182 Chronic kidney disease, stage 2 (mild): Secondary | ICD-10-CM | POA: Diagnosis present

## 2015-03-16 DIAGNOSIS — B9689 Other specified bacterial agents as the cause of diseases classified elsewhere: Secondary | ICD-10-CM | POA: Diagnosis present

## 2015-03-16 DIAGNOSIS — E119 Type 2 diabetes mellitus without complications: Secondary | ICD-10-CM | POA: Diagnosis present

## 2015-03-16 DIAGNOSIS — R410 Disorientation, unspecified: Secondary | ICD-10-CM

## 2015-03-16 DIAGNOSIS — N179 Acute kidney failure, unspecified: Secondary | ICD-10-CM | POA: Diagnosis present

## 2015-03-16 DIAGNOSIS — Z7982 Long term (current) use of aspirin: Secondary | ICD-10-CM | POA: Diagnosis not present

## 2015-03-16 DIAGNOSIS — Z955 Presence of coronary angioplasty implant and graft: Secondary | ICD-10-CM | POA: Diagnosis not present

## 2015-03-16 DIAGNOSIS — K219 Gastro-esophageal reflux disease without esophagitis: Secondary | ICD-10-CM | POA: Diagnosis present

## 2015-03-16 DIAGNOSIS — Z7902 Long term (current) use of antithrombotics/antiplatelets: Secondary | ICD-10-CM | POA: Diagnosis not present

## 2015-03-16 DIAGNOSIS — J449 Chronic obstructive pulmonary disease, unspecified: Secondary | ICD-10-CM | POA: Diagnosis present

## 2015-03-16 DIAGNOSIS — N390 Urinary tract infection, site not specified: Secondary | ICD-10-CM | POA: Diagnosis present

## 2015-03-16 DIAGNOSIS — Z85828 Personal history of other malignant neoplasm of skin: Secondary | ICD-10-CM | POA: Diagnosis not present

## 2015-03-16 DIAGNOSIS — I2510 Atherosclerotic heart disease of native coronary artery without angina pectoris: Secondary | ICD-10-CM | POA: Diagnosis present

## 2015-03-16 DIAGNOSIS — E785 Hyperlipidemia, unspecified: Secondary | ICD-10-CM | POA: Diagnosis present

## 2015-03-16 DIAGNOSIS — I252 Old myocardial infarction: Secondary | ICD-10-CM | POA: Diagnosis not present

## 2015-03-16 DIAGNOSIS — F0390 Unspecified dementia without behavioral disturbance: Secondary | ICD-10-CM | POA: Diagnosis present

## 2015-03-16 DIAGNOSIS — I255 Ischemic cardiomyopathy: Secondary | ICD-10-CM | POA: Diagnosis present

## 2015-03-16 LAB — CBC
HCT: 25.5 % — ABNORMAL LOW (ref 39.0–52.0)
Hemoglobin: 8.4 g/dL — ABNORMAL LOW (ref 13.0–17.0)
MCH: 30 pg (ref 26.0–34.0)
MCHC: 32.9 g/dL (ref 30.0–36.0)
MCV: 91.1 fL (ref 78.0–100.0)
Platelets: 174 10*3/uL (ref 150–400)
RBC: 2.8 MIL/uL — ABNORMAL LOW (ref 4.22–5.81)
RDW: 16.1 % — AB (ref 11.5–15.5)
WBC: 5.7 10*3/uL (ref 4.0–10.5)

## 2015-03-16 LAB — BASIC METABOLIC PANEL
Anion gap: 10 (ref 5–15)
BUN: 26 mg/dL — AB (ref 6–20)
CO2: 22 mmol/L (ref 22–32)
Calcium: 8.5 mg/dL — ABNORMAL LOW (ref 8.9–10.3)
Chloride: 104 mmol/L (ref 101–111)
Creatinine, Ser: 1.4 mg/dL — ABNORMAL HIGH (ref 0.61–1.24)
GFR calc Af Amer: 52 mL/min — ABNORMAL LOW (ref >60)
GFR, EST NON AFRICAN AMERICAN: 45 mL/min — AB (ref >60)
GLUCOSE: 140 mg/dL — AB (ref 70–99)
Potassium: 4 mmol/L (ref 3.5–5.1)
Sodium: 136 mmol/L (ref 135–145)

## 2015-03-16 LAB — LACTIC ACID, PLASMA: Lactic Acid, Venous: 0.7 mmol/L (ref 0.5–2.0)

## 2015-03-16 LAB — GLUCOSE, CAPILLARY
GLUCOSE-CAPILLARY: 123 mg/dL — AB (ref 70–99)
GLUCOSE-CAPILLARY: 94 mg/dL (ref 70–99)
Glucose-Capillary: 136 mg/dL — ABNORMAL HIGH (ref 70–99)
Glucose-Capillary: 127 mg/dL — ABNORMAL HIGH (ref 70–99)

## 2015-03-16 MED ORDER — ISOSORBIDE MONONITRATE ER 60 MG PO TB24
60.0000 mg | ORAL_TABLET | Freq: Every day | ORAL | Status: DC
  Administered 2015-03-16 – 2015-03-18 (×3): 60 mg via ORAL
  Filled 2015-03-16 (×3): qty 1

## 2015-03-16 MED ORDER — ENOXAPARIN SODIUM 30 MG/0.3ML ~~LOC~~ SOLN
30.0000 mg | SUBCUTANEOUS | Status: DC
  Administered 2015-03-16: 30 mg via SUBCUTANEOUS
  Filled 2015-03-16: qty 0.3

## 2015-03-16 MED ORDER — ONDANSETRON HCL 4 MG PO TABS: 4.0000 mg | ORAL_TABLET | Freq: Four times a day (QID) | ORAL | Status: DC | PRN

## 2015-03-16 MED ORDER — ONDANSETRON HCL 4 MG/2ML IJ SOLN: 4.0000 mg | Freq: Four times a day (QID) | INTRAMUSCULAR | Status: DC | PRN

## 2015-03-16 MED ORDER — ROSUVASTATIN CALCIUM 10 MG PO TABS
10.0000 mg | ORAL_TABLET | Freq: Every day | ORAL | Status: DC
  Administered 2015-03-16 – 2015-03-17 (×3): 10 mg via ORAL
  Filled 2015-03-16 (×4): qty 1

## 2015-03-16 MED ORDER — CLOPIDOGREL BISULFATE 75 MG PO TABS
75.0000 mg | ORAL_TABLET | Freq: Every day | ORAL | Status: DC
  Administered 2015-03-16 – 2015-03-18 (×3): 75 mg via ORAL
  Filled 2015-03-16 (×3): qty 1

## 2015-03-16 MED ORDER — ASPIRIN 81 MG PO CHEW
81.0000 mg | CHEWABLE_TABLET | Freq: Every day | ORAL | Status: DC
  Administered 2015-03-16 – 2015-03-18 (×3): 81 mg via ORAL
  Filled 2015-03-16 (×3): qty 1

## 2015-03-16 MED ORDER — VITAMIN D 1000 UNITS PO TABS
2000.0000 [IU] | ORAL_TABLET | Freq: Every day | ORAL | Status: DC
  Administered 2015-03-16 – 2015-03-18 (×3): 2000 [IU] via ORAL
  Filled 2015-03-16 (×3): qty 2

## 2015-03-16 MED ORDER — ALBUTEROL SULFATE (2.5 MG/3ML) 0.083% IN NEBU: 2.5000 mg | INHALATION_SOLUTION | RESPIRATORY_TRACT | Status: DC | PRN

## 2015-03-16 MED ORDER — SODIUM CHLORIDE 0.9 % IV SOLN
INTRAVENOUS | Status: DC
Start: 2015-03-16 — End: 2015-03-16
  Administered 2015-03-16: 03:00:00 via INTRAVENOUS

## 2015-03-16 MED ORDER — SERTRALINE HCL 50 MG PO TABS
50.0000 mg | ORAL_TABLET | Freq: Every day | ORAL | Status: DC
  Administered 2015-03-16 – 2015-03-17 (×3): 50 mg via ORAL
  Filled 2015-03-16 (×3): qty 1

## 2015-03-16 MED ORDER — ACETAMINOPHEN 325 MG PO TABS: 650.0000 mg | ORAL_TABLET | Freq: Four times a day (QID) | ORAL | Status: DC | PRN

## 2015-03-16 MED ORDER — INSULIN ASPART 100 UNIT/ML ~~LOC~~ SOLN
0.0000 [IU] | Freq: Three times a day (TID) | SUBCUTANEOUS | Status: DC
  Administered 2015-03-16 (×3): 1 [IU] via SUBCUTANEOUS
  Administered 2015-03-17 – 2015-03-18 (×3): 2 [IU] via SUBCUTANEOUS
  Administered 2015-03-18: 1 [IU] via SUBCUTANEOUS

## 2015-03-16 MED ORDER — ALBUTEROL SULFATE HFA 108 (90 BASE) MCG/ACT IN AERS: 2.0000 | INHALATION_SPRAY | Freq: Four times a day (QID) | RESPIRATORY_TRACT | Status: DC | PRN

## 2015-03-16 MED ORDER — ACETAMINOPHEN 650 MG RE SUPP: 650.0000 mg | Freq: Four times a day (QID) | RECTAL | Status: DC | PRN

## 2015-03-16 MED ORDER — IPRATROPIUM-ALBUTEROL 0.5-2.5 (3) MG/3ML IN SOLN: 3.0000 mL | Freq: Three times a day (TID) | RESPIRATORY_TRACT | Status: DC

## 2015-03-16 MED ORDER — CARVEDILOL 25 MG PO TABS
25.0000 mg | ORAL_TABLET | Freq: Two times a day (BID) | ORAL | Status: DC
  Administered 2015-03-16 – 2015-03-18 (×5): 25 mg via ORAL
  Filled 2015-03-16 (×6): qty 1

## 2015-03-16 NOTE — Progress Notes (Signed)
[facility]                                                                              Patient Demographics  [name], is a 81 y.o. male, DOB - Aug 10, 1929, YTK:[id number]  Admit date - 03/15/2015   Admitting Physician Phillips Grout, MD  Outpatient Primary MD for the patient is  Melinda Crutch, MD  LOS -    Chief Complaint  Patient presents with  . Shortness of Breath       Brief HPI   81 yo male with GERD, hypertension, COPD, dyslipidemia, diabetes presented with a friend for confusion, shortness of breath, generalized weakness. Patient was in rehabilitation center and went out for lunch with his friend and got confused. otherwise no nausea or vomiting. Some loose stools a night before the admission but none on the day of admission. He complained of no fevers however patient had a temp of 102 in the ED.  No complaints of chest pain but has been more sob than normal. No dysuria/hematuria or urinary symptoms. No focal neuro deficits. Found to have uti and fever. No rashes.   Assessment & Plan    Principal Problem:   Acute encephalopathy with UTI (lower urinary tract infection) - Follow urine culture and sensitivities, continue IV Rocephin  Active Problems: Shortness of breath - Unclear etiology, on chest x-ray patient has chronic bilateral pleural effusions stable, extensive chronic mediastinal and hilar calcified lymph nodes, no pneumonia. Currently stable and O2 sats 99% on 2 L - Discontinue IV fluids   Diabetes mellitus - Currently stable, place on sliding scale insulin   CKD (chronic kidney disease), stage II - Currently stable, creatinine improving, DC further IV fluids    COPD (chronic obstructive pulmonary disease) - Currently stable, no wheezing    Chronic diastolic CHF (congestive heart failure) Currently Compensated, 2-D echo in 4/13 showed EF of 65-70%, noted to have more motion abnormalities  DC IV fluids  Hyperlipidemia, CAD   continue aspirin, Coreg, statin, plavix  Code Status: Full CODE STATUS   Family Communication: Discussed in detail with the patient, all imaging results, lab results explained to the patient    Disposition Plan: Skilled nursing facility when stable   Time Spent in minutes   47minutes  Procedures  none  Consults   none  DVT Prophylaxis  Lovenox  Medications  Scheduled Meds: . aspirin  81 mg Oral q1800  . carvedilol  25 mg Oral BID WC  . cefTRIAXone (ROCEPHIN)  IV  1 g Intravenous Q24H  . cholecalciferol  2,000 Units Oral Daily  . clopidogrel  75 mg Oral Daily  . enoxaparin (LOVENOX) injection  30 mg Subcutaneous Q24H  . insulin aspart  0-9 Units Subcutaneous TID WC  . isosorbide mononitrate  60 mg Oral Daily  . rosuvastatin  10 mg Oral QHS  . sertraline  50 mg Oral QHS   Continuous Infusions: . sodium chloride 75 mL/hr at 03/16/15 0313   PRN Meds:.acetaminophen **OR** acetaminophen, albuterol, ondansetron **OR** ondansetron (ZOFRAN) IV   Antibiotics   Anti-infectives    Start     Dose/Rate Route Frequency Ordered  Stop   03/15/15 2100  cefTRIAXone (ROCEPHIN) 1 g in dextrose 5 % 50 mL IVPB     1 g 100 mL/hr over 30 Minutes Intravenous Every 24 hours 03/15/15 2046          Subjective:   [name] was seen and examined today. Alert and awake and oriented to self. Denies any dizziness, chest pain, shortness of breath, abdominal pain, N/V/D/C, new weakness, numbess, tingling. No acute events overnight.    Objective:   Blood pressure 125/57, pulse 58, temperature 97.8 F (36.6 C), temperature source Oral, resp. rate 17, height 5' 4.02" (1.626 m), weight 67 kg (147 lb 11.3 oz), SpO2 99 %.  Wt Readings from Last 3 Encounters:  03/16/15 67 kg (147 lb 11.3 oz)  02/10/15 66.497 kg (146 lb 9.6 oz)  08/22/14 72.666 kg (160 lb 3.2 oz)     Intake/Output Summary (Last 24 hours) at 03/16/15 1230 Last data filed at 03/16/15 0830  Gross per 24 hour  Intake       0 ml  Output      0 ml  Net      0 ml    Exam  General: Alert and oriented x  self, NAD, otherwise pleasant   HEENT:  PERRLA, EOMI, Anicteic Sclera, mucous membranes moist.   Neck: Supple, no JVD, no masses  CVS: S1 S2 auscultated, no rubs, murmurs or gallops. Regular rate and rhythm.  Respiratory: Clear to auscultation bilaterally, no wheezing, rales or rhonchi  Abdomen: Soft, nontender, nondistended, + bowel sounds  Ext: no cyanosis clubbing or edema  Neuro: AAOx3, Cr N's II- XII. Strength 5/5 upper and lower extremities bilaterally  Skin: No rashes  Psych: Normal affect and demeanor, alert and oriented x self   Data Review   Micro Results Recent Results (from the past 240 hour(s))  Urine culture     Status: None (Preliminary result)   Collection Time: 03/15/15  8:00 PM  Result Value Ref Range Status   Specimen Description URINE, CLEAN CATCH  Final   Special Requests NONE  Final   Colony Count   Final    >=100,000 COLONIES/ML Performed at [facility] Performed at [facility]    Report Status PENDING  Incomplete    Radiology Reports Dg Chest 2 View (if Patient Has Fever And/or Copd)  03/15/2015   CLINICAL DATA:  81 year old male with shortness of breath for 2 days. Initial encounter.  Current history of fibrosing mediastinitis.  EXAM: CHEST  2 VIEW  COMPARISON:  02/10/2015 and earlier, including chest CT 06/04/2007.  FINDINGS: Semi upright AP and lateral views of the chest. Numerous calcified mediastinal and hilar lymph nodes re - identified. Chronic bilateral pleural effusions are mildly increased compared to the prior two view study on 02/26/2009, do not appear significantly changed since April. No superimposed pneumothorax or pulmonary edema. No acute pulmonary opacity identified. Osteopenia. No acute osseous abnormality identified. Calcified atherosclerosis of the aorta.  IMPRESSION: 1. Chronic bilateral  pleural effusions, stable since April. No superimposed acute cardiopulmonary abnormality identified. 2. Extensive chronic mediastinal and hilar calcified lymph nodes. See chest CT report 06/04/2007.   Electronically Signed   By: Genevie Ann M.D.   On: 03/15/2015 16:55   Dg Pelvis 1-2 Views  03/15/2015   CLINICAL DATA:  [place] 4 days ago, RIGHT buttock pain  EXAM: PELVIS - 1-2 VIEW  COMPARISON:  None  FINDINGS:  Osseous demineralization.  Hip and SI joints symmetric and preserved.  No acute fracture, dislocation or bone destruction.  Slightly rotated to the LEFT.  Extensive atherosclerotic calcification.  IMPRESSION: No acute osseous abnormalities.   Electronically Signed   By: Lavonia Dana M.D.   On: 03/15/2015 18:54   Dg Elbow 2 Views Left  03/15/2015   CLINICAL DATA:  Left elbow pain. Fall 4 days ago. Unable to supinate.  EXAM: LEFT ELBOW - 2 VIEW  COMPARISON:  None.  FINDINGS: Bony demineralization. Considerable loss of articular space. Elbow joint effusion with posterior fat pad. Irregular spurring of the olecranon proximally. Mild irregularity of the lateral head of the radius may be from a small radial head fracture or spurring. There is also spurring of the coronoid process of the ulna.  IMPRESSION: 1. Elbow joint effusion, which can be a secondary sign of otherwise occult underlying fracture. 2. Considerable spurring along the olecranon, radial head, the coronoid process of the ulna. The radial head irregularity could alternatively indicating nondisplaced fracture although spur is favored. Consider CT.   Electronically Signed   By: Van Clines M.D.   On: 03/15/2015 18:56   Dg Elbow 2 Views Right  03/15/2015   CLINICAL DATA:  Right elbow pain after fall 4 days ago.  EXAM: RIGHT ELBOW - 2 VIEW  COMPARISON:  None.  FINDINGS: There is no evidence of fracture, dislocation, or joint effusion. There is no evidence of arthropathy or other focal bone abnormality. Soft tissues are unremarkable.  IMPRESSION:  Negative.   Electronically Signed   By: Andreas Newport M.D.   On: 03/15/2015 18:53   Dg Wrist Complete Left  03/15/2015   CLINICAL DATA:  Left hand and wrist pain since fall 4 days ago  EXAM: LEFT WRIST - COMPLETE 3+ VIEW  COMPARISON:  None.  FINDINGS: There is no evidence of fracture or dislocation. There is no evidence of arthropathy or other focal bone abnormality. Soft tissues are unremarkable.  IMPRESSION: Negative.   Electronically Signed   By: Andreas Newport M.D.   On: 03/15/2015 18:54   Dg Hand 2 View Left  03/15/2015   CLINICAL DATA:  Fall 4 days ago with persistent hand pain and bruising, initial encounter  EXAM: LEFT HAND - 2 VIEW  COMPARISON:  None.  FINDINGS: Degenerative changes of the interphalangeal joints are noted. Mild osteopenia is seen. No acute fracture or dislocation is noted. Vascular calcifications are seen.  IMPRESSION: No acute abnormality noted.   Electronically Signed   By: Inez Catalina M.D.   On: 03/15/2015 18:54   Dg Shoulder Left  03/15/2015   CLINICAL DATA:  Left proximal humerus pain since fall 4 days ago.  EXAM: LEFT SHOULDER - 2+ VIEW  COMPARISON:  None.  FINDINGS: There is no evidence of fracture or dislocation. There is no evidence of arthropathy or other focal bone abnormality. Soft tissues are unremarkable.  IMPRESSION: Negative.   Electronically Signed   By: Andreas Newport M.D.   On: 03/15/2015 18:54    CBC  Recent Labs Lab 03/15/15 1610 03/16/15 0416  WBC 6.9 5.7  HGB 10.0* 8.4*  HCT 29.9* 25.5*  PLT 210 174  MCV 90.1 91.1  MCH 30.1 30.0  MCHC 33.4 32.9  RDW 16.0* 16.1*    Chemistries   Recent Labs Lab 03/15/15 1610 03/16/15 0416  NA 134* 136  K 4.8 4.0  CL 103 104  CO2 22 22  GLUCOSE 187* 140*  BUN 27* 26*  CREATININE 1.59* 1.40*  CALCIUM 8.7* 8.5*   ------------------------------------------------------------------------------------------------------------------ estimated creatinine clearance is 33.5 mL/min (by C-G formula  based on Cr of 1.4). ------------------------------------------------------------------------------------------------------------------ No results for input(s): HGBA1C in the last 72 hours. ------------------------------------------------------------------------------------------------------------------ No results for input(s): CHOL, HDL, LDLCALC, TRIG, CHOLHDL, LDLDIRECT in the last 72 hours. ------------------------------------------------------------------------------------------------------------------ No results for input(s): TSH, T4TOTAL, T3FREE, THYROIDAB in the last 72 hours.  Invalid input(s): FREET3 ------------------------------------------------------------------------------------------------------------------ No results for input(s): VITAMINB12, FOLATE, FERRITIN, TIBC, IRON, RETICCTPCT in the last 72 hours.  Coagulation profile No results for input(s): INR, PROTIME in the last 168 hours.  No results for input(s): DDIMER in the last 72 hours.  Cardiac Enzymes No results for input(s): CKMB, TROPONINI, MYOGLOBIN in the last 168 hours.  Invalid input(s): CK ------------------------------------------------------------------------------------------------------------------ Invalid input(s): POCBNP   Recent Labs  03/16/15 0719 03/16/15 1138  GLUCAP 136* 127*     RAI,RIPUDEEP M.D. [facility] 03/16/2015, 12:30 PM  Pager: [phone]   Between 7am to 7pm - call Pager - [phone]  After 7pm go to [email or web address] - password [email or web address]  Call night coverage person covering after 7pm

## 2015-03-16 NOTE — Progress Notes (Signed)
RT Note: Pt denies SOB, BBS clear, states he does not need HHN. RT changed to PRN & will continue to monitor.

## 2015-03-16 NOTE — Evaluation (Signed)
Physical Therapy Evaluation Patient Details Name: [name] MRN: [id number] DOB: 12/28/1928 Today's Date: 03/16/2015   History of Present Illness  Patient is a 81 y/o male presents from [facility] with confusion, weakness and SOB. Pt s/p fall at SNF a few days ago. Admitted with acute encephalopathy with UTI. CXR- chronic bilateral pleural effusions stable.    Clinical Impression  Patient presents with weakness, dyspnea on exertion with drop in Sa02 on RA and decreased endurance impacting mobility. Pt s/p fall at SNF. Pt fatigues easily. Would benefit from return to [facility] to improve transfers, gait, balance and mobility so pt can minimize fall risk and maximize independence.    Follow Up Recommendations SNF;Supervision/Assistance - 24 hour    Equipment Recommendations  None recommended by PT    Recommendations for Other Services       Precautions / Restrictions Precautions Precautions: Fall Restrictions Weight Bearing Restrictions: No      Mobility  Bed Mobility               General bed mobility comments: Sitting in recliner upon PT arrival.   Transfers Overall transfer level: Needs assistance Equipment used: Rolling walker (2 wheeled) Transfers: Sit to/from Stand Sit to Stand: Min assist         General transfer comment: Min A to rise from chair. Cues for hand placement. Unable to stand without assist.   Ambulation/Gait Ambulation/Gait assistance: Min guard Ambulation Distance (Feet): 15 Feet Assistive device: Rolling walker (2 wheeled) Gait Pattern/deviations: Step-through pattern;Decreased stride length;Trunk flexed;Decreased step length - right;Decreased step length - left   Gait velocity interpretation: Below normal speed for age/gender General Gait Details: Slow, unsteady gait. Dyspnea present. Sa02 dropped to 87% on RA. Cues for pursed lip breathing.  Stairs            Wheelchair Mobility    Modified Rankin (Stroke Patients Only)        Balance Overall balance assessment: Needs assistance;History of Falls Sitting-balance support: Feet supported;No upper extremity supported Sitting balance-Leahy Scale: Fair     Standing balance support: During functional activity Standing balance-Leahy Scale: Poor Standing balance comment: Relient on RW.                              Pertinent Vitals/Pain Pain Assessment: No/denies pain    Home Living Family/patient expects to be discharged to:: Skilled nursing facility                      Prior Function Level of Independence: Needs assistance   Gait / Transfers Assistance Needed: Assist with ambulation with RW at [facility]. Pt s/p fall at SNF.  ADL's / Homemaking Assistance Needed: Assist with ADLs.        Hand Dominance   Dominant Hand: Right    Extremity/Trunk Assessment   Upper Extremity Assessment: Defer to OT evaluation           Lower Extremity Assessment: Generalized weakness         Communication   Communication: No difficulties  Cognition Arousal/Alertness: Awake/alert Behavior During Therapy: WFL for tasks assessed/performed Overall Cognitive Status: No family/caregiver present to determine baseline cognitive functioning (Delayed processing. [name] he was at [facility].)                      General Comments      Exercises General Exercises - Lower Extremity Ankle Circles/Pumps:  Both;15 reps;Seated Long Arc Quad: Both;10 reps;Seated      Assessment/Plan    PT Assessment Patient needs continued PT services  PT Diagnosis Generalized weakness;Difficulty walking   PT Problem List Decreased strength;Cardiopulmonary status limiting activity;Decreased activity tolerance;Decreased balance;Decreased mobility  PT Treatment Interventions Balance training;Patient/family education;Functional mobility training;Therapeutic activities;Therapeutic exercise;Gait training;DME instruction   PT Goals (Current goals can be  found in the Care Plan section) Acute Rehab PT Goals Patient Stated Goal: none stated PT Goal Formulation: With patient Time For Goal Achievement: 03/30/15 Potential to Achieve Goals: Fair    Frequency Min 2X/week   Barriers to discharge        Co-evaluation               End of Session Equipment Utilized During Treatment: Gait belt Activity Tolerance: Patient limited by fatigue;Other (comment) (Dyspnea ) Patient left: in chair;with call bell/phone within reach Nurse Communication: Mobility status    Functional Assessment Tool Used: Clinical judgment Functional Limitation: Mobility: Walking and moving around Mobility: Walking and Moving Around Current Status (Y3338): At least 20 percent but less than 40 percent impaired, limited or restricted Mobility: Walking and Moving Around Goal Status [phone]): At least 1 percent but less than 20 percent impaired, limited or restricted    Time: 1345-1406 PT Time Calculation (min) (ACUTE ONLY): 21 min   Charges:   PT Evaluation $Initial PT Evaluation Tier I: 1 Procedure     PT G Codes:   PT G-Codes **NOT FOR INPATIENT CLASS** Functional Assessment Tool Used: Clinical judgment Functional Limitation: Mobility: Walking and moving around Mobility: Walking and Moving Around Current Status (T6606): At least 20 percent but less than 40 percent impaired, limited or restricted Mobility: Walking and Moving Around Goal Status [phone]): At least 1 percent but less than 20 percent impaired, limited or restricted    [name] A 03/16/2015, 2:17 PM Wray Kearns, [place], DPT [phone]

## 2015-03-17 LAB — BASIC METABOLIC PANEL
Anion gap: 9 (ref 5–15)
BUN: 20 mg/dL (ref 6–20)
CO2: 24 mmol/L (ref 22–32)
CREATININE: 1.09 mg/dL (ref 0.61–1.24)
Calcium: 8.4 mg/dL — ABNORMAL LOW (ref 8.9–10.3)
Chloride: 104 mmol/L (ref 101–111)
GLUCOSE: 154 mg/dL — AB (ref 70–99)
Potassium: 3.9 mmol/L (ref 3.5–5.1)
Sodium: 137 mmol/L (ref 135–145)

## 2015-03-17 LAB — URINE CULTURE

## 2015-03-17 LAB — GLUCOSE, CAPILLARY
GLUCOSE-CAPILLARY: 151 mg/dL — AB (ref 70–99)
Glucose-Capillary: 159 mg/dL — ABNORMAL HIGH (ref 70–99)
Glucose-Capillary: 114 mg/dL — ABNORMAL HIGH (ref 70–99)
Glucose-Capillary: 152 mg/dL — ABNORMAL HIGH (ref 70–99)

## 2015-03-17 LAB — CBC
HCT: 25.5 % — ABNORMAL LOW (ref 39.0–52.0)
Hemoglobin: 8.5 g/dL — ABNORMAL LOW (ref 13.0–17.0)
MCH: 30.1 pg (ref 26.0–34.0)
MCHC: 33.3 g/dL (ref 30.0–36.0)
MCV: 90.4 fL (ref 78.0–100.0)
PLATELETS: 167 10*3/uL (ref 150–400)
RBC: 2.82 MIL/uL — ABNORMAL LOW (ref 4.22–5.81)
RDW: 15.9 % — ABNORMAL HIGH (ref 11.5–15.5)
WBC: 2.8 10*3/uL — ABNORMAL LOW (ref 4.0–10.5)

## 2015-03-17 MED ORDER — ENOXAPARIN SODIUM 40 MG/0.4ML ~~LOC~~ SOLN
40.0000 mg | SUBCUTANEOUS | Status: DC
  Administered 2015-03-17: 40 mg via SUBCUTANEOUS
  Filled 2015-03-17: qty 0.4

## 2015-03-17 NOTE — Clinical Social Work Note (Signed)
CSW received consult for patient to go to ALF, CSW received phone call from Molli Barrows from [facility] who stated the ALF will need an FL2 faxed to facility and patient can receive PT and OT at ALF.  [facility] requested a TB test be done on patient, CSW notified bedside nurse.  Patient will be discharging to the ALF once he is medically ready and discharge orders have been received.  Patient is alert and oriented x2 place and person.  Patient was at [facility], but he could not recall where he was living before he came to the hospital.  Patient has a POA [name] [phone]) who is the main contact person.  Patient has FL2 on chart awaiting to be signed by physician, formal assessment to follow.  Jones Broom. Benns Church, MSW, [place] 03/17/2015 5:26 PM

## 2015-03-17 NOTE — Progress Notes (Signed)
[facility]                                                                              Patient Demographics  [name], is a 81 y.o. male, DOB - [birth year], WLN:[id number]  Admit date - 03/15/2015   Admitting Physician Phillips Grout, MD  Outpatient Primary MD for the patient is  Melinda Crutch, MD  LOS - 1   Chief Complaint  Patient presents with  . Shortness of Breath       Brief HPI   81 yo male with GERD, hypertension, COPD, dyslipidemia, diabetes presented with a friend for confusion, shortness of breath, generalized weakness. Patient was in rehabilitation center and went out for lunch with his friend and got confused. otherwise no nausea or vomiting. Some loose stools a night before the admission but none on the day of admission. He complained of no fevers however patient had a temp of 102 in the ED.  No complaints of chest pain but has been more sob than normal. No dysuria/hematuria or urinary symptoms. No focal neuro deficits. Found to have uti and fever. No rashes.   Assessment & Plan    Principal Problem:   Acute encephalopathy with UTI (lower urinary tract infection) - Urine culture with her more than 100,000 colonies of gram-negative rods, continue IV Rocephin  Active Problems: Shortness of breath: Resolved - Unclear etiology, on chest x-ray patient has chronic bilateral pleural effusions stable, extensive chronic mediastinal and hilar calcified lymph nodes, no pneumonia. Currently stable and O2 sats 100% on 2 L - Discontinued IV fluids , wean off O2 as tolerated  Diabetes mellitus - Currently stable, continue sliding scale insulin   Mild acute on chronic CKD (chronic kidney disease), stage II - Creatinine improved, IV fluids discontinued    COPD (chronic obstructive pulmonary disease) - Currently stable, no wheezing    Chronic diastolic CHF (congestive heart failure) Currently Compensated, 2-D echo in 4/13 showed EF of 65-70%, noted  to have more motion abnormalities  DC IV fluids  Hyperlipidemia, CAD   continue aspirin, Coreg, statin, plavix  Code Status: Full CODE STATUS   Family Communication: Discussed in detail with the patient, all imaging results, lab results explained to the patient    Disposition Plan: Skilled nursing facility hopefully tomorrow 5/11, follow urine culture and sensitivity results  Time Spent in minutes   46minutes  Procedures  none  Consults   none  DVT Prophylaxis  Lovenox  Medications  Scheduled Meds: . aspirin  81 mg Oral q1800  . carvedilol  25 mg Oral BID WC  . cefTRIAXone (ROCEPHIN)  IV  1 g Intravenous Q24H  . cholecalciferol  2,000 Units Oral Daily  . clopidogrel  75 mg Oral Daily  . enoxaparin (LOVENOX) injection  30 mg Subcutaneous Q24H  . insulin aspart  0-9 Units Subcutaneous TID WC  . isosorbide mononitrate  60 mg Oral Daily  . rosuvastatin  10 mg Oral QHS  . sertraline  50 mg Oral QHS   Continuous Infusions:   PRN Meds:.acetaminophen **OR** acetaminophen, albuterol, ondansetron **OR** ondansetron (ZOFRAN) IV   Antibiotics   Anti-infectives  Start     Dose/Rate Route Frequency Ordered Stop   03/15/15 2100  cefTRIAXone (ROCEPHIN) 1 g in dextrose 5 % 50 mL IVPB     1 g 100 mL/hr over 30 Minutes Intravenous Every 24 hours 03/15/15 2046          Subjective:   [name] was seen and examined today. Alert and awake, pleasant and oriented to self. Denies any fevers, chills, chest pain or shortness of breath. No abdominal pain, N/V/D/C, new weakness, numbess, tingling. No acute events overnight.    Objective:   Blood pressure 141/46, pulse 56, temperature 98.3 F (36.8 C), temperature source Oral, resp. rate 20, height 5' 4.02" (1.626 m), weight 69.1 kg (152 lb 5.4 oz), SpO2 100 %.  Wt Readings from Last 3 Encounters:  03/17/15 69.1 kg (152 lb 5.4 oz)  02/10/15 66.497 kg (146 lb 9.6 oz)  08/22/14 72.666 kg (160 lb 3.2 oz)     Intake/Output  Summary (Last 24 hours) at 03/17/15 1250 Last data filed at 03/17/15 0900  Gross per 24 hour  Intake    770 ml  Output   1250 ml  Net   -480 ml    Exam  General: Alert and oriented x  self, NAD, otherwise pleasant   HEENT:  PERRLA, EOMI, Anicteic Sclera  Neck: Supple, no JVD, no masses  CVS: S1 S2 clear, no MRG  Respiratory: CTA B  Abdomen: Soft, nontender, nondistended, + bowel sounds  Ext: no cyanosis clubbing or edema  Neuro: AAOx3, Cr N's II- XII. Strength 5/5 upper and lower extremities bilaterally  Skin: No rashes  Psych: Normal affect and demeanor, alert and oriented x self   Data Review   Micro Results Recent Results (from the past 240 hour(s))  Urine culture     Status: None (Preliminary result)   Collection Time: 03/15/15  8:00 PM  Result Value Ref Range Status   Specimen Description URINE, CLEAN CATCH  Final   Special Requests NONE  Final   Colony Count   Final    >=100,000 COLONIES/ML Performed at [facility]    Culture   Final    [place] Performed at [facility]    Report Status PENDING  Incomplete  Culture, blood (routine x 2)     Status: None (Preliminary result)   Collection Time: 03/15/15  9:20 PM  Result Value Ref Range Status   Specimen Description BLOOD LEFT ANTECUBITAL  Final   Special Requests BOTTLES DRAWN AEROBIC AND ANAEROBIC 10CC EA  Final   Culture   Final           BLOOD CULTURE RECEIVED NO GROWTH TO DATE CULTURE WILL BE HELD FOR 5 DAYS BEFORE ISSUING A FINAL NEGATIVE REPORT Performed at [facility]    Report Status PENDING  Incomplete  Culture, blood (routine x 2)     Status: None (Preliminary result)   Collection Time: 03/15/15  9:25 PM  Result Value Ref Range Status   Specimen Description BLOOD RIGHT ANTECUBITAL  Final   Special Requests BOTTLES DRAWN AEROBIC AND ANAEROBIC 10CC EA  Final   Culture   Final           BLOOD CULTURE RECEIVED NO GROWTH TO DATE CULTURE WILL BE HELD FOR 5  DAYS BEFORE ISSUING A FINAL NEGATIVE REPORT Performed at [facility]    Report Status PENDING  Incomplete    Radiology Reports Dg Chest 2 View (if Patient Has Fever And/or  Copd)  03/15/2015   CLINICAL DATA:  81 year old male with shortness of breath for 2 days. Initial encounter.  Current history of fibrosing mediastinitis.  EXAM: CHEST  2 VIEW  COMPARISON:  02/10/2015 and earlier, including chest CT 06/04/2007.  FINDINGS: Semi upright AP and lateral views of the chest. Numerous calcified mediastinal and hilar lymph nodes re - identified. Chronic bilateral pleural effusions are mildly increased compared to the prior two view study on 02/26/2009, do not appear significantly changed since April. No superimposed pneumothorax or pulmonary edema. No acute pulmonary opacity identified. Osteopenia. No acute osseous abnormality identified. Calcified atherosclerosis of the aorta.  IMPRESSION: 1. Chronic bilateral pleural effusions, stable since April. No superimposed acute cardiopulmonary abnormality identified. 2. Extensive chronic mediastinal and hilar calcified lymph nodes. See chest CT report 06/04/2007.   Electronically Signed   By: Genevie Ann M.D.   On: 03/15/2015 16:55   Dg Pelvis 1-2 Views  03/15/2015   CLINICAL DATA:  [place] 4 days ago, RIGHT buttock pain  EXAM: PELVIS - 1-2 VIEW  COMPARISON:  None  FINDINGS: Osseous demineralization.  Hip and SI joints symmetric and preserved.  No acute fracture, dislocation or bone destruction.  Slightly rotated to the LEFT.  Extensive atherosclerotic calcification.  IMPRESSION: No acute osseous abnormalities.   Electronically Signed   By: Lavonia Dana M.D.   On: 03/15/2015 18:54   Dg Elbow 2 Views Left  03/15/2015   CLINICAL DATA:  Left elbow pain. Fall 4 days ago. Unable to supinate.  EXAM: LEFT ELBOW - 2 VIEW  COMPARISON:  None.  FINDINGS: Bony demineralization. Considerable loss of articular space. Elbow joint effusion with posterior fat pad. Irregular spurring  of the olecranon proximally. Mild irregularity of the lateral head of the radius may be from a small radial head fracture or spurring. There is also spurring of the coronoid process of the ulna.  IMPRESSION: 1. Elbow joint effusion, which can be a secondary sign of otherwise occult underlying fracture. 2. Considerable spurring along the olecranon, radial head, the coronoid process of the ulna. The radial head irregularity could alternatively indicating nondisplaced fracture although spur is favored. Consider CT.   Electronically Signed   By: Van Clines M.D.   On: 03/15/2015 18:56   Dg Elbow 2 Views Right  03/15/2015   CLINICAL DATA:  Right elbow pain after fall 4 days ago.  EXAM: RIGHT ELBOW - 2 VIEW  COMPARISON:  None.  FINDINGS: There is no evidence of fracture, dislocation, or joint effusion. There is no evidence of arthropathy or other focal bone abnormality. Soft tissues are unremarkable.  IMPRESSION: Negative.   Electronically Signed   By: Andreas Newport M.D.   On: 03/15/2015 18:53   Dg Wrist Complete Left  03/15/2015   CLINICAL DATA:  Left hand and wrist pain since fall 4 days ago  EXAM: LEFT WRIST - COMPLETE 3+ VIEW  COMPARISON:  None.  FINDINGS: There is no evidence of fracture or dislocation. There is no evidence of arthropathy or other focal bone abnormality. Soft tissues are unremarkable.  IMPRESSION: Negative.   Electronically Signed   By: Andreas Newport M.D.   On: 03/15/2015 18:54   Dg Hand 2 View Left  03/15/2015   CLINICAL DATA:  Fall 4 days ago with persistent hand pain and bruising, initial encounter  EXAM: LEFT HAND - 2 VIEW  COMPARISON:  None.  FINDINGS: Degenerative changes of the interphalangeal joints are noted. Mild osteopenia is seen. No acute fracture or dislocation is  noted. Vascular calcifications are seen.  IMPRESSION: No acute abnormality noted.   Electronically Signed   By: Inez Catalina M.D.   On: 03/15/2015 18:54   Dg Shoulder Left  03/15/2015   CLINICAL DATA:   Left proximal humerus pain since fall 4 days ago.  EXAM: LEFT SHOULDER - 2+ VIEW  COMPARISON:  None.  FINDINGS: There is no evidence of fracture or dislocation. There is no evidence of arthropathy or other focal bone abnormality. Soft tissues are unremarkable.  IMPRESSION: Negative.   Electronically Signed   By: Andreas Newport M.D.   On: 03/15/2015 18:54    CBC  Recent Labs Lab 03/15/15 1610 03/16/15 0416 03/17/15 0720  WBC 6.9 5.7 2.8*  HGB 10.0* 8.4* 8.5*  HCT 29.9* 25.5* 25.5*  PLT 210 174 167  MCV 90.1 91.1 90.4  MCH 30.1 30.0 30.1  MCHC 33.4 32.9 33.3  RDW 16.0* 16.1* 15.9*    Chemistries   Recent Labs Lab 03/15/15 1610 03/16/15 0416 03/17/15 0720  NA 134* 136 137  K 4.8 4.0 3.9  CL 103 104 104  CO2 22 22 24   GLUCOSE 187* 140* 154*  BUN 27* 26* 20  CREATININE 1.59* 1.40* 1.09  CALCIUM 8.7* 8.5* 8.4*   ------------------------------------------------------------------------------------------------------------------ estimated creatinine clearance is 43 mL/min (by C-G formula based on Cr of 1.09). ------------------------------------------------------------------------------------------------------------------ No results for input(s): HGBA1C in the last 72 hours. ------------------------------------------------------------------------------------------------------------------ No results for input(s): CHOL, HDL, LDLCALC, TRIG, CHOLHDL, LDLDIRECT in the last 72 hours. ------------------------------------------------------------------------------------------------------------------ No results for input(s): TSH, T4TOTAL, T3FREE, THYROIDAB in the last 72 hours.  Invalid input(s): FREET3 ------------------------------------------------------------------------------------------------------------------ No results for input(s): VITAMINB12, FOLATE, FERRITIN, TIBC, IRON, RETICCTPCT in the last 72 hours.  Coagulation profile No results for input(s): INR, PROTIME in the last  168 hours.  No results for input(s): DDIMER in the last 72 hours.  Cardiac Enzymes No results for input(s): CKMB, TROPONINI, MYOGLOBIN in the last 168 hours.  Invalid input(s): CK ------------------------------------------------------------------------------------------------------------------ Invalid input(s): [place]  03/16/15 0719 03/16/15 1138 03/16/15 1659 03/16/15 2155 03/17/15 0743 03/17/15 1226  GLUCAP 136* 127* 123* 94 159* 114*     RAI,RIPUDEEP M.D. [facility] 03/17/2015, 12:50 PM  Pager: [phone]   Between 7am to 7pm - call Pager - [phone]  After 7pm go to [email or web address] - password [email or web address]  Call night coverage person covering after 7pm

## 2015-03-18 ENCOUNTER — Inpatient Hospital Stay (HOSPITAL_COMMUNITY): Payer: Medicare Other

## 2015-03-18 LAB — CBC
HEMATOCRIT: 25.2 % — AB (ref 39.0–52.0)
HEMOGLOBIN: 8.4 g/dL — AB (ref 13.0–17.0)
MCH: 29.9 pg (ref 26.0–34.0)
MCHC: 33.3 g/dL (ref 30.0–36.0)
MCV: 89.7 fL (ref 78.0–100.0)
Platelets: 161 10*3/uL (ref 150–400)
RBC: 2.81 MIL/uL — ABNORMAL LOW (ref 4.22–5.81)
RDW: 16 % — ABNORMAL HIGH (ref 11.5–15.5)
WBC: 2.8 10*3/uL — ABNORMAL LOW (ref 4.0–10.5)

## 2015-03-18 LAB — BASIC METABOLIC PANEL
Anion gap: 6 (ref 5–15)
BUN: 15 mg/dL (ref 6–20)
CALCIUM: 8.4 mg/dL — AB (ref 8.9–10.3)
CO2: 25 mmol/L (ref 22–32)
CREATININE: 0.92 mg/dL (ref 0.61–1.24)
Chloride: 106 mmol/L (ref 101–111)
GFR calc Af Amer: >60 mL/min (ref >60)
GFR calc non Af Amer: >60 mL/min (ref >60)
Glucose, Bld: 136 mg/dL — ABNORMAL HIGH (ref 70–99)
Potassium: 3.8 mmol/L (ref 3.5–5.1)
Sodium: 137 mmol/L (ref 135–145)

## 2015-03-18 LAB — GLUCOSE, CAPILLARY
GLUCOSE-CAPILLARY: 126 mg/dL — AB (ref 70–99)
GLUCOSE-CAPILLARY: 156 mg/dL — AB (ref 70–99)
Glucose-Capillary: 123 mg/dL — ABNORMAL HIGH (ref 70–99)

## 2015-03-18 MED ORDER — SERTRALINE HCL 50 MG PO TABS: 50.0000 mg | ORAL_TABLET | Freq: Every day | ORAL | Status: AC

## 2015-03-18 MED ORDER — CIPROFLOXACIN HCL 500 MG PO TABS
500.0000 mg | ORAL_TABLET | Freq: Two times a day (BID) | ORAL | Status: DC
  Administered 2015-03-18: 500 mg via ORAL
  Filled 2015-03-18: qty 1

## 2015-03-18 MED ORDER — CIPROFLOXACIN HCL 500 MG PO TABS: 500.0000 mg | ORAL_TABLET | Freq: Two times a day (BID) | ORAL | Status: DC

## 2015-03-18 MED ORDER — TUBERCULIN PPD 5 UNIT/0.1ML ID SOLN
5.0000 [IU] | Freq: Once | INTRADERMAL | Status: DC
  Administered 2015-03-18: 5 [IU] via INTRADERMAL
  Filled 2015-03-18: qty 0.1

## 2015-03-18 MED ORDER — FUROSEMIDE 20 MG PO TABS
20.0000 mg | ORAL_TABLET | Freq: Every day | ORAL | Status: DC
  Administered 2015-03-18: 20 mg via ORAL
  Filled 2015-03-18: qty 1

## 2015-03-18 NOTE — Progress Notes (Signed)
Report was given to [facility] the nurse at [facility].

## 2015-03-18 NOTE — Clinical Social Work Note (Addendum)
Clinical Social Work Assessment  Patient Details  Name: ABEER IVERSEN MRN: [id number] Date of Birth: 24-Nov-1928  Date of referral:  03/17/15               Reason for consult:  Facility Placement                Permission sought to share information with:  Other, Customer service manager (Jenifer Snider patient's Media planner) Permission granted to share information::  Yes, Verbal Permission Granted  Name::     [name]  Agency::  [facility]  Relationship::     Contact Information:     Housing/Transportation Living arrangements for the past 2 months:  [place], [place] of Information:  Patient, Friend/Neighbor Patient Interpreter Needed:  None Criminal Activity/Legal Involvement Pertinent to Current Situation/Hospitalization:  No - Comment as needed Significant Relationships:  Friend Lives with:  Facility Resident Do you feel safe going back to the place where you live?  Yes (Patient is moving to an ALF due to his wife recently passing away.) Need for family participation in patient care:  Yes (Comment) (Patient has some moments of confusion.)  Care giving concerns:  Patient's wife was his caregiver and they had a family friend, but his wife just recently passed away.  Patient is now going to a ALF to receive the appropriate amount support that he needs.   Social Worker assessment / plan: Patient is a 81 year old male who was living with his wife, and then was at a SNF for short term rehab.  Patient is alert and oriented x3, he could not specify where he was.  Patient has a caregiver named Social research officer, government who is his advocate and POA for him.  CSW was given permission to speak to his caregiver and she stated he will be moving to an ALF once he is discharge from hospital.  Patient stated he was aware of the ALF and is looking forward to going to the ALF.  Patient's caregiver stated patient's wife just passed away in 03-15-2023, and the funeral is  this Friday, patient did not inform CSW about his wife's passing.  Patient was pleasant and talkative, and he expressed that he is ready to leave the hospital, because he has places he needs to be.  Patient is in agreement to going to ALF with home health PT.  Employment status:  Retired Forensic scientist:  Information systems manager, Programmer, applications PT Recommendations:  [address], [place] / Referral to community resources:     Patient/Family's Response to care:  Patient and his caregiver are in agreement to going to ALF.  Patient/Family's Understanding of and Emotional Response to Diagnosis, Current Treatment, and Prognosis: Patient's is aware that he needs extra help and is looking forward to going to ALF.  According to patient's caregiver, he has been wheelchair bound for the past few years, and his wife was his primary support.  Emotional Assessment Appearance:  Appears stated age Attitude/Demeanor/Rapport:    Affect (typically observed):  Accepting, Pleasant, Appropriate Orientation:  Oriented to Self, Oriented to  Time, Oriented to Situation Alcohol / Substance use:  Not Applicable Psych involvement (Current and /or in the community):  No (Comment)  Discharge Needs  Concerns to be addressed:  No discharge needs identified Readmission within the last 30 days:  No Current discharge risk:  None Barriers to Discharge:  No Barriers Identified   Jones Broom. [place], MSW, [place] 03/18/2015 6:28 PM

## 2015-03-18 NOTE — Discharge Instructions (Signed)

## 2015-03-18 NOTE — Care Management Note (Addendum)
Case Management Note  Patient Details  Name: [name] MRN: [id number] Date of Birth: [birth year]  Subjective/Objective:                    Action/Plan: Referral faxed to [facility] at [facility] , confirmed it was received .   Expected Discharge Date:         03-18-15         Expected Discharge Plan:  Assisted Living / Rest Home  In-House Referral:  Clinical Social Work  Discharge planning Services  CM Consult  Post Acute Care Choice:  Durable Medical Equipment Choice offered to:  NA Anderson [place] [phone])  DME Arranged:  Programmer, multimedia DME Agency:  [place]:  PT, OT [place] Agency:  [place]  Status of Service:  Completed, signed off  Medicare Important Message Given:    Date Medicare IM Given:  03/18/15 Medicare IM give by:  Magdalen Spatz RN BSN  Date Additional Medicare IM Given:    Additional Medicare Important Message give by:     If discussed at [place] of Stay Meetings, dates discussed:    Additional Comments:  Marilu Favre, RN 03/18/2015, 1:43 PM

## 2015-03-18 NOTE — Discharge Summary (Signed)
Physician Discharge Summary   Patient ID: [name] MRN: [id number] DOB/AGE: Oct 04, 1929 81 y.o.  Admit date: 03/15/2015 Discharge date: 03/18/2015  Primary Care Physician:   Melinda Crutch, MD  Discharge Diagnoses:    . UTI (lower urinary tract infection)- Enterobacter   Acute encephalopathy secondary to UTI  . Chronic diastolic CHF (congestive heart failure) . COPD (chronic obstructive pulmonary disease) . Coronary atherosclerosis of native coronary artery . CKD (chronic kidney disease), stage II . Essential hypertension . SOB (shortness of breath)- resolved   Consults:  None   Recommendations for Outpatient Follow-up:  ALF requested PPD test, to be placed today prior to discharge, chest x-ray also ordered.  TESTS THAT NEED FOLLOW-UP Please follow PPD on 5/13   DIET: Soft diet    Allergies:   Allergies  Allergen Reactions  . Pioglitazone Cough  . Ramipril Cough     Discharge Medications:   Medication List    STOP taking these medications        ZETIA 10 MG tablet  Generic drug:  ezetimibe      TAKE these medications        aspirin 81 MG chewable tablet  Chew 81 mg by mouth daily at 6 PM.     carvedilol 25 MG tablet  Commonly known as:  COREG  Take 1 tablet (25 mg total) by mouth 2 (two) times daily with a meal.     cholecalciferol 1000 UNITS tablet  Commonly known as:  VITAMIN D  Take 2,000 Units by mouth daily.     ciprofloxacin 500 MG tablet  Commonly known as:  CIPRO  Take 1 tablet (500 mg total) by mouth 2 (two) times daily. X 4 more days     clopidogrel 75 MG tablet  Commonly known as:  PLAVIX  TAKE 1 TABLET DAILY     Fish Oil 1200 MG Caps  Take 1,200 mg by mouth 2 (two) times daily. 9am and 5pm     furosemide 20 MG tablet  Commonly known as:  LASIX  TAKE 1 TABLET DAILY     isosorbide mononitrate 60 MG 24 hr tablet  Commonly known as:  IMDUR  TAKE 1 TABLET DAILY     potassium chloride 10 MEQ tablet  Commonly known as:   K-DUR  TAKE 1 TABLET EVERY OTHER DAY     PROAIR HFA 108 (90 BASE) MCG/ACT inhaler  Generic drug:  albuterol  Inhale 2 puffs into the lungs every 6 (six) hours as needed for wheezing or shortness of breath.     rosuvastatin 10 MG tablet  Commonly known as:  CRESTOR  Take 10 mg by mouth at bedtime.     sertraline 50 MG tablet  Commonly known as:  ZOLOFT  Take 1 tablet (50 mg total) by mouth at bedtime.         Brief H and P: For complete details please refer to admission H and P, but in brief46 yo male with GERD, hypertension, COPD, dyslipidemia, diabetes presented with a friend for confusion, shortness of breath, generalized weakness. Patient was in rehabilitation center and went out for lunch with his friend and got confused. otherwise no nausea or vomiting. Some loose stools a night before the admission but none on the day of admission. He complained of no fevers however patient had a temp of 102 in the ED.  No complaints of chest pain but has been more sob than normal. No dysuria/hematuria or urinary symptoms. No focal neuro deficits. Found  to have uti and fever. No rashes.   Hospital Course:  Acute encephalopathy with UTI (lower urinary tract infection) with underlying history of dementia Patient's mental status has significantly improved, urine culture showed more than 100,000 colonies of Enterobacter cloacae. Patient has received 3 days of IV Rocephin. Will DC on ciprofloxacin per sensitivities for 4 more days to complete the course of 7 days.   Shortness of breath: Resolved - Unclear etiology, on chest x-ray patient has chronic bilateral pleural effusions stable, extensive chronic mediastinal and hilar calcified lymph nodes, no pneumonia. patient is off O2, O2 sats 98% on room air.   Mild acute on chronic CKD (chronic kidney disease), stage II - Creatinine improved, IV fluids discontinued   COPD (chronic obstructive pulmonary disease) - Currently stable, no  wheezing   Chronic diastolic CHF (congestive heart failure) Currently Compensated, 2-D echo in 4/13 showed EF of 65-70%, noted to have no wall motion abnormalities.IV fluids were discontinued      Hyperlipidemia, CAD  continue aspirin, Coreg, statin, plavix  Day of Discharge BP 129/53 mmHg  Pulse 56  Temp(Src) 98.2 F (36.8 C) (Oral)  Resp 19  Ht 5' 4.02" (1.626 m)  Wt 69.1 kg (152 lb 5.4 oz)  BMI 26.14 kg/m2  SpO2 98%  Physical Exam: General: Alert and awake oriented x 2 not in any acute distress. HEENT: anicteric sclera, pupils reactive to light and accommodation CVS: S1-S2 clear no murmur rubs or gallops Chest: clear to auscultation bilaterally, no wheezing rales or rhonchi Abdomen: soft nontender, nondistended, normal bowel sounds Extremities: no cyanosis, clubbing or edema noted bilaterally Neuro: Cranial nerves II-XII intact, no focal neurological deficits   The results of significant diagnostics from this hospitalization (including imaging, microbiology, ancillary and laboratory) are listed below for reference.    LAB RESULTS: Basic Metabolic Panel:  Recent Labs Lab 03/17/15 0720 03/18/15 0338  NA 137 137  K 3.9 3.8  CL 104 106  CO2 24 25  GLUCOSE 154* 136*  BUN 20 15  CREATININE 1.09 0.92  CALCIUM 8.4* 8.4*   Liver Function Tests: No results for input(s): AST, ALT, ALKPHOS, BILITOT, PROT, ALBUMIN in the last 168 hours. No results for input(s): LIPASE, AMYLASE in the last 168 hours. No results for input(s): AMMONIA in the last 168 hours. CBC:  Recent Labs Lab 03/17/15 0720 03/18/15 0338  WBC 2.8* 2.8*  HGB 8.5* 8.4*  HCT 25.5* 25.2*  MCV 90.4 89.7  PLT 167 161   Cardiac Enzymes: No results for input(s): CKTOTAL, CKMB, CKMBINDEX, TROPONINI in the last 168 hours. BNP: Invalid input(s): POCBNP CBG:  Recent Labs Lab 03/17/15 2206 03/18/15 0739  GLUCAP 151* 126*    Significant Diagnostic Studies:  Dg Chest 2 View (if Patient Has  Fever And/or Copd)  03/15/2015   CLINICAL DATA:  81 year old male with shortness of breath for 2 days. Initial encounter.  Current history of fibrosing mediastinitis.  EXAM: CHEST  2 VIEW  COMPARISON:  02/10/2015 and earlier, including chest CT 06/04/2007.  FINDINGS: Semi upright AP and lateral views of the chest. Numerous calcified mediastinal and hilar lymph nodes re - identified. Chronic bilateral pleural effusions are mildly increased compared to the prior two view study on 02/26/2009, do not appear significantly changed since April. No superimposed pneumothorax or pulmonary edema. No acute pulmonary opacity identified. Osteopenia. No acute osseous abnormality identified. Calcified atherosclerosis of the aorta.  IMPRESSION: 1. Chronic bilateral pleural effusions, stable since April. No superimposed acute cardiopulmonary abnormality identified. 2. Extensive chronic  mediastinal and hilar calcified lymph nodes. See chest CT report 06/04/2007.   Electronically Signed   By: Genevie Ann M.D.   On: 03/15/2015 16:55   Dg Pelvis 1-2 Views  03/15/2015   CLINICAL DATA:  [place] 4 days ago, RIGHT buttock pain  EXAM: PELVIS - 1-2 VIEW  COMPARISON:  None  FINDINGS: Osseous demineralization.  Hip and SI joints symmetric and preserved.  No acute fracture, dislocation or bone destruction.  Slightly rotated to the LEFT.  Extensive atherosclerotic calcification.  IMPRESSION: No acute osseous abnormalities.   Electronically Signed   By: Lavonia Dana M.D.   On: 03/15/2015 18:54   Dg Elbow 2 Views Left  03/15/2015   CLINICAL DATA:  Left elbow pain. Fall 4 days ago. Unable to supinate.  EXAM: LEFT ELBOW - 2 VIEW  COMPARISON:  None.  FINDINGS: Bony demineralization. Considerable loss of articular space. Elbow joint effusion with posterior fat pad. Irregular spurring of the olecranon proximally. Mild irregularity of the lateral head of the radius may be from a small radial head fracture or spurring. There is also spurring of the coronoid  process of the ulna.  IMPRESSION: 1. Elbow joint effusion, which can be a secondary sign of otherwise occult underlying fracture. 2. Considerable spurring along the olecranon, radial head, the coronoid process of the ulna. The radial head irregularity could alternatively indicating nondisplaced fracture although spur is favored. Consider CT.   Electronically Signed   By: Van Clines M.D.   On: 03/15/2015 18:56   Dg Elbow 2 Views Right  03/15/2015   CLINICAL DATA:  Right elbow pain after fall 4 days ago.  EXAM: RIGHT ELBOW - 2 VIEW  COMPARISON:  None.  FINDINGS: There is no evidence of fracture, dislocation, or joint effusion. There is no evidence of arthropathy or other focal bone abnormality. Soft tissues are unremarkable.  IMPRESSION: Negative.   Electronically Signed   By: Andreas Newport M.D.   On: 03/15/2015 18:53   Dg Wrist Complete Left  03/15/2015   CLINICAL DATA:  Left hand and wrist pain since fall 4 days ago  EXAM: LEFT WRIST - COMPLETE 3+ VIEW  COMPARISON:  None.  FINDINGS: There is no evidence of fracture or dislocation. There is no evidence of arthropathy or other focal bone abnormality. Soft tissues are unremarkable.  IMPRESSION: Negative.   Electronically Signed   By: Andreas Newport M.D.   On: 03/15/2015 18:54   Dg Hand 2 View Left  03/15/2015   CLINICAL DATA:  Fall 4 days ago with persistent hand pain and bruising, initial encounter  EXAM: LEFT HAND - 2 VIEW  COMPARISON:  None.  FINDINGS: Degenerative changes of the interphalangeal joints are noted. Mild osteopenia is seen. No acute fracture or dislocation is noted. Vascular calcifications are seen.  IMPRESSION: No acute abnormality noted.   Electronically Signed   By: Inez Catalina M.D.   On: 03/15/2015 18:54   Dg Shoulder Left  03/15/2015   CLINICAL DATA:  Left proximal humerus pain since fall 4 days ago.  EXAM: LEFT SHOULDER - 2+ VIEW  COMPARISON:  None.  FINDINGS: There is no evidence of fracture or dislocation. There is no  evidence of arthropathy or other focal bone abnormality. Soft tissues are unremarkable.  IMPRESSION: Negative.   Electronically Signed   By: Andreas Newport M.D.   On: 03/15/2015 18:54    2D ECHO:   Disposition and Follow-up:     Discharge Instructions    Diet - low  sodium heart healthy    Complete by:  As directed      Discharge instructions    Complete by:  As directed   DIET: SOFT with thin liquids     Increase activity slowly    Complete by:  As directed             DISPOSITION: Assisted living facility   DISCHARGE FOLLOW-UP Follow-up Information    Follow up with  Melinda Crutch, MD. Schedule an appointment as soon as possible for a visit in 10 days.   Specialty:  Family Medicine   Why:  for hospital follow-up   Contact information:   [place] [address] [phone]        Time spent on Discharge: 35 mins  Signed:   Ares Tegtmeyer M.D. [facility] 03/18/2015, 11:50 AM Pager: [phone]

## 2015-03-18 NOTE — Clinical Social Work Note (Signed)
Patient to be d/c'ed today to [facility].  Patient and family agreeable to plans will transport via patient's caregiver RN to call report.  Evette Cristal, MSW, [place]

## 2015-03-22 LAB — CULTURE, BLOOD (ROUTINE X 2)
CULTURE: NO GROWTH
Culture: NO GROWTH

## 2015-03-28 ENCOUNTER — Observation Stay (HOSPITAL_COMMUNITY)
Admission: EM | Admit: 2015-03-28 | Discharge: 2015-03-30 | Payer: Medicare Other | Attending: Internal Medicine | Admitting: Internal Medicine

## 2015-03-28 ENCOUNTER — Encounter (HOSPITAL_COMMUNITY): Payer: Self-pay | Admitting: Emergency Medicine

## 2015-03-28 ENCOUNTER — Other Ambulatory Visit (HOSPITAL_COMMUNITY): Payer: Self-pay

## 2015-03-28 ENCOUNTER — Emergency Department (HOSPITAL_COMMUNITY): Payer: Medicare Other

## 2015-03-28 DIAGNOSIS — Z7982 Long term (current) use of aspirin: Secondary | ICD-10-CM | POA: Diagnosis not present

## 2015-03-28 DIAGNOSIS — Z87440 Personal history of urinary (tract) infections: Secondary | ICD-10-CM | POA: Diagnosis not present

## 2015-03-28 DIAGNOSIS — D649 Anemia, unspecified: Secondary | ICD-10-CM | POA: Diagnosis not present

## 2015-03-28 DIAGNOSIS — I5032 Chronic diastolic (congestive) heart failure: Secondary | ICD-10-CM | POA: Diagnosis present

## 2015-03-28 DIAGNOSIS — I10 Essential (primary) hypertension: Secondary | ICD-10-CM | POA: Diagnosis present

## 2015-03-28 DIAGNOSIS — E7800 Pure hypercholesterolemia, unspecified: Secondary | ICD-10-CM | POA: Diagnosis present

## 2015-03-28 DIAGNOSIS — Z8546 Personal history of malignant neoplasm of prostate: Secondary | ICD-10-CM | POA: Diagnosis not present

## 2015-03-28 DIAGNOSIS — E785 Hyperlipidemia, unspecified: Secondary | ICD-10-CM | POA: Insufficient documentation

## 2015-03-28 DIAGNOSIS — I252 Old myocardial infarction: Secondary | ICD-10-CM | POA: Diagnosis not present

## 2015-03-28 DIAGNOSIS — N182 Chronic kidney disease, stage 2 (mild): Secondary | ICD-10-CM | POA: Insufficient documentation

## 2015-03-28 DIAGNOSIS — Z888 Allergy status to other drugs, medicaments and biological substances status: Secondary | ICD-10-CM | POA: Insufficient documentation

## 2015-03-28 DIAGNOSIS — R0602 Shortness of breath: Secondary | ICD-10-CM

## 2015-03-28 DIAGNOSIS — I129 Hypertensive chronic kidney disease with stage 1 through stage 4 chronic kidney disease, or unspecified chronic kidney disease: Secondary | ICD-10-CM | POA: Insufficient documentation

## 2015-03-28 DIAGNOSIS — I255 Ischemic cardiomyopathy: Secondary | ICD-10-CM | POA: Insufficient documentation

## 2015-03-28 DIAGNOSIS — E1165 Type 2 diabetes mellitus with hyperglycemia: Secondary | ICD-10-CM

## 2015-03-28 DIAGNOSIS — E119 Type 2 diabetes mellitus without complications: Secondary | ICD-10-CM | POA: Insufficient documentation

## 2015-03-28 DIAGNOSIS — Z955 Presence of coronary angioplasty implant and graft: Secondary | ICD-10-CM | POA: Diagnosis not present

## 2015-03-28 DIAGNOSIS — I2510 Atherosclerotic heart disease of native coronary artery without angina pectoris: Secondary | ICD-10-CM | POA: Diagnosis not present

## 2015-03-28 DIAGNOSIS — Z79899 Other long term (current) drug therapy: Secondary | ICD-10-CM

## 2015-03-28 DIAGNOSIS — Z66 Do not resuscitate: Secondary | ICD-10-CM | POA: Diagnosis not present

## 2015-03-28 DIAGNOSIS — J441 Chronic obstructive pulmonary disease with (acute) exacerbation: Principal | ICD-10-CM | POA: Insufficient documentation

## 2015-03-28 DIAGNOSIS — E1122 Type 2 diabetes mellitus with diabetic chronic kidney disease: Secondary | ICD-10-CM

## 2015-03-28 DIAGNOSIS — K219 Gastro-esophageal reflux disease without esophagitis: Secondary | ICD-10-CM | POA: Insufficient documentation

## 2015-03-28 DIAGNOSIS — Z7902 Long term (current) use of antithrombotics/antiplatelets: Secondary | ICD-10-CM | POA: Diagnosis not present

## 2015-03-28 DIAGNOSIS — IMO0002 Reserved for concepts with insufficient information to code with codable children: Secondary | ICD-10-CM

## 2015-03-28 DIAGNOSIS — Z85828 Personal history of other malignant neoplasm of skin: Secondary | ICD-10-CM | POA: Insufficient documentation

## 2015-03-28 LAB — CBC WITH DIFFERENTIAL/PLATELET
BASOS PCT: 0 % (ref 0–1)
Basophils Absolute: 0 10*3/uL (ref 0.0–0.1)
EOS PCT: 2 % (ref 0–5)
Eosinophils Absolute: 0.1 10*3/uL (ref 0.0–0.7)
HCT: 28.5 % — ABNORMAL LOW (ref 39.0–52.0)
Hemoglobin: 9.4 g/dL — ABNORMAL LOW (ref 13.0–17.0)
LYMPHS ABS: 0.2 10*3/uL — AB (ref 0.7–4.0)
Lymphocytes Relative: 6 % — ABNORMAL LOW (ref 12–46)
MCH: 30.3 pg (ref 26.0–34.0)
MCHC: 33 g/dL (ref 30.0–36.0)
MCV: 91.9 fL (ref 78.0–100.0)
MONO ABS: 0.4 10*3/uL (ref 0.1–1.0)
Monocytes Relative: 12 % (ref 3–12)
NEUTROS PCT: 80 % — AB (ref 43–77)
Neutro Abs: 2.5 10*3/uL (ref 1.7–7.7)
Platelets: 193 10*3/uL (ref 150–400)
RBC: 3.1 MIL/uL — ABNORMAL LOW (ref 4.22–5.81)
RDW: 16.7 % — ABNORMAL HIGH (ref 11.5–15.5)
WBC: 3.2 10*3/uL — ABNORMAL LOW (ref 4.0–10.5)

## 2015-03-28 LAB — BASIC METABOLIC PANEL
Anion gap: 10 (ref 5–15)
BUN: 22 mg/dL — ABNORMAL HIGH (ref 6–20)
CHLORIDE: 103 mmol/L (ref 101–111)
CO2: 22 mmol/L (ref 22–32)
CREATININE: 1.29 mg/dL — AB (ref 0.61–1.24)
Calcium: 8.5 mg/dL — ABNORMAL LOW (ref 8.9–10.3)
GFR calc Af Amer: 57 mL/min — ABNORMAL LOW (ref >60)
GFR calc non Af Amer: 49 mL/min — ABNORMAL LOW (ref >60)
Glucose, Bld: 188 mg/dL — ABNORMAL HIGH (ref 65–99)
Potassium: 4.5 mmol/L (ref 3.5–5.1)
Sodium: 135 mmol/L (ref 135–145)

## 2015-03-28 LAB — I-STAT VENOUS BLOOD GAS, ED
BICARBONATE: 24.4 meq/L — AB (ref 20.0–24.0)
O2 SAT: 35 %
PCO2 VEN: 38.4 mmHg — AB (ref 45.0–50.0)
PH VEN: 7.41 — AB (ref 7.250–7.300)
TCO2: 26 mmol/L (ref 0–100)
pO2, Ven: 21 mmHg — CL (ref 30.0–45.0)

## 2015-03-28 LAB — I-STAT TROPONIN, ED: Troponin i, poc: 0 ng/mL (ref 0.00–0.08)

## 2015-03-28 LAB — BRAIN NATRIURETIC PEPTIDE: B Natriuretic Peptide: 415.5 pg/mL — ABNORMAL HIGH (ref 0.0–100.0)

## 2015-03-28 NOTE — ED Notes (Signed)
Pt brought to ED by [facility] from [facility], pt was recently change to this SNF, last Wed and since then he is just getting half of the dose of Lasix that he supposed to get, family states he is been more alert today, but he is been very lethargic during the past week. Pt very SOB on [facility] arrival 0.5 Atrovent, 5 of Albuterol and 125 mg Solumedrol IV given by [facility] and pt placed on CPAP.
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
## 2015-03-28 NOTE — ED Notes (Signed)
Dr Ralene Bathe given a copy of venous blood gas

## 2015-03-29 ENCOUNTER — Observation Stay (HOSPITAL_COMMUNITY): Payer: Medicare Other

## 2015-03-29 DIAGNOSIS — E119 Type 2 diabetes mellitus without complications: Secondary | ICD-10-CM | POA: Diagnosis not present

## 2015-03-29 DIAGNOSIS — I5031 Acute diastolic (congestive) heart failure: Secondary | ICD-10-CM

## 2015-03-29 DIAGNOSIS — J441 Chronic obstructive pulmonary disease with (acute) exacerbation: Secondary | ICD-10-CM | POA: Diagnosis not present

## 2015-03-29 DIAGNOSIS — I5032 Chronic diastolic (congestive) heart failure: Secondary | ICD-10-CM | POA: Diagnosis not present

## 2015-03-29 DIAGNOSIS — N182 Chronic kidney disease, stage 2 (mild): Secondary | ICD-10-CM | POA: Diagnosis not present

## 2015-03-29 DIAGNOSIS — R0602 Shortness of breath: Secondary | ICD-10-CM | POA: Insufficient documentation

## 2015-03-29 DIAGNOSIS — E785 Hyperlipidemia, unspecified: Secondary | ICD-10-CM | POA: Diagnosis not present

## 2015-03-29 LAB — IRON AND TIBC
Iron: 55 ug/dL (ref 45–182)
Saturation Ratios: 22 % (ref 17.9–39.5)
TIBC: 246 ug/dL — AB (ref 250–450)
UIBC: 191 ug/dL

## 2015-03-29 LAB — URINE MICROSCOPIC-ADD ON

## 2015-03-29 LAB — URINALYSIS, ROUTINE W REFLEX MICROSCOPIC
Bilirubin Urine: NEGATIVE
GLUCOSE, UA: 250 mg/dL — AB
HGB URINE DIPSTICK: NEGATIVE
Ketones, ur: NEGATIVE mg/dL
Nitrite: NEGATIVE
Protein, ur: 30 mg/dL — AB
Specific Gravity, Urine: 1.016 (ref 1.005–1.030)
Urobilinogen, UA: 1 mg/dL (ref 0.0–1.0)
pH: 6 (ref 5.0–8.0)

## 2015-03-29 LAB — BASIC METABOLIC PANEL
Anion gap: 9 (ref 5–15)
BUN: 22 mg/dL — ABNORMAL HIGH (ref 6–20)
CHLORIDE: 105 mmol/L (ref 101–111)
CO2: 23 mmol/L (ref 22–32)
Calcium: 8.9 mg/dL (ref 8.9–10.3)
Creatinine, Ser: 1.04 mg/dL (ref 0.61–1.24)
GFR calc non Af Amer: >60 mL/min (ref >60)
Glucose, Bld: 229 mg/dL — ABNORMAL HIGH (ref 65–99)
POTASSIUM: 4.2 mmol/L (ref 3.5–5.1)
SODIUM: 137 mmol/L (ref 135–145)

## 2015-03-29 LAB — MRSA PCR SCREENING: MRSA by PCR: NEGATIVE

## 2015-03-29 LAB — CBC
HCT: 25.7 % — ABNORMAL LOW (ref 39.0–52.0)
HEMOGLOBIN: 8.9 g/dL — AB (ref 13.0–17.0)
MCH: 31.1 pg (ref 26.0–34.0)
MCHC: 34.6 g/dL (ref 30.0–36.0)
MCV: 89.9 fL (ref 78.0–100.0)
PLATELETS: 171 10*3/uL (ref 150–400)
RBC: 2.86 MIL/uL — AB (ref 4.22–5.81)
RDW: 16.2 % — ABNORMAL HIGH (ref 11.5–15.5)
WBC: 1.9 10*3/uL — ABNORMAL LOW (ref 4.0–10.5)

## 2015-03-29 LAB — TROPONIN I: Troponin I: <0.03 ng/mL (ref <0.031)

## 2015-03-29 LAB — FERRITIN: Ferritin: 225 ng/mL (ref 24–336)

## 2015-03-29 LAB — GLUCOSE, CAPILLARY: Glucose-Capillary: 226 mg/dL — ABNORMAL HIGH (ref 65–99)

## 2015-03-29 MED ORDER — CLOPIDOGREL BISULFATE 75 MG PO TABS
75.0000 mg | ORAL_TABLET | Freq: Every day | ORAL | Status: DC
  Administered 2015-03-29 – 2015-03-30 (×2): 75 mg via ORAL
  Filled 2015-03-29 (×2): qty 1

## 2015-03-29 MED ORDER — FUROSEMIDE 40 MG PO TABS
40.0000 mg | ORAL_TABLET | Freq: Every day | ORAL | Status: DC
  Administered 2015-03-30: 40 mg via ORAL
  Filled 2015-03-29: qty 1

## 2015-03-29 MED ORDER — ONDANSETRON HCL 4 MG/2ML IJ SOLN: 4.0000 mg | Freq: Four times a day (QID) | INTRAMUSCULAR | Status: DC | PRN

## 2015-03-29 MED ORDER — DOCUSATE SODIUM 100 MG PO CAPS
100.0000 mg | ORAL_CAPSULE | Freq: Two times a day (BID) | ORAL | Status: DC
  Administered 2015-03-29 – 2015-03-30 (×4): 100 mg via ORAL
  Filled 2015-03-29 (×5): qty 1

## 2015-03-29 MED ORDER — PREDNISONE 20 MG PO TABS
40.0000 mg | ORAL_TABLET | Freq: Every day | ORAL | Status: DC
  Administered 2015-03-29 – 2015-03-30 (×2): 40 mg via ORAL
  Filled 2015-03-29 (×3): qty 2

## 2015-03-29 MED ORDER — SENNOSIDES-DOCUSATE SODIUM 8.6-50 MG PO TABS: 1.0000 | ORAL_TABLET | Freq: Every evening | ORAL | Status: DC | PRN

## 2015-03-29 MED ORDER — POTASSIUM CHLORIDE ER 10 MEQ PO TBCR
10.0000 meq | EXTENDED_RELEASE_TABLET | ORAL | Status: DC
  Administered 2015-03-29: 10 meq via ORAL
  Filled 2015-03-29: qty 1

## 2015-03-29 MED ORDER — ACETAMINOPHEN 325 MG PO TABS: 325.0000 mg | ORAL_TABLET | Freq: Four times a day (QID) | ORAL | Status: DC | PRN

## 2015-03-29 MED ORDER — FUROSEMIDE 20 MG PO TABS
20.0000 mg | ORAL_TABLET | Freq: Every day | ORAL | Status: DC
  Administered 2015-03-29: 20 mg via ORAL
  Filled 2015-03-29: qty 1

## 2015-03-29 MED ORDER — ALUM & MAG HYDROXIDE-SIMETH 200-200-20 MG/5ML PO SUSP: 30.0000 mL | Freq: Four times a day (QID) | ORAL | Status: DC | PRN

## 2015-03-29 MED ORDER — ONDANSETRON HCL 4 MG PO TABS: 4.0000 mg | ORAL_TABLET | Freq: Four times a day (QID) | ORAL | Status: DC | PRN

## 2015-03-29 MED ORDER — ISOSORBIDE MONONITRATE ER 60 MG PO TB24
60.0000 mg | ORAL_TABLET | Freq: Every day | ORAL | Status: DC
  Administered 2015-03-29 – 2015-03-30 (×2): 60 mg via ORAL
  Filled 2015-03-29 (×2): qty 1

## 2015-03-29 MED ORDER — CALCIUM CARBONATE ANTACID 500 MG PO CHEW
1.0000 | CHEWABLE_TABLET | Freq: Every day | ORAL | Status: DC | PRN
  Filled 2015-03-29: qty 1

## 2015-03-29 MED ORDER — ASPIRIN 81 MG PO CHEW
81.0000 mg | CHEWABLE_TABLET | Freq: Every day | ORAL | Status: DC
Start: 2015-03-29 — End: 2015-03-30
  Administered 2015-03-29: 81 mg via ORAL
  Filled 2015-03-29 (×2): qty 1

## 2015-03-29 MED ORDER — IPRATROPIUM-ALBUTEROL 0.5-2.5 (3) MG/3ML IN SOLN
3.0000 mL | Freq: Four times a day (QID) | RESPIRATORY_TRACT | Status: DC
  Administered 2015-03-29: 3 mL via RESPIRATORY_TRACT
  Filled 2015-03-29: qty 3

## 2015-03-29 MED ORDER — PANTOPRAZOLE SODIUM 40 MG PO TBEC
40.0000 mg | DELAYED_RELEASE_TABLET | Freq: Two times a day (BID) | ORAL | Status: DC
  Administered 2015-03-29 – 2015-03-30 (×3): 40 mg via ORAL
  Filled 2015-03-29 (×3): qty 1

## 2015-03-29 MED ORDER — MOMETASONE FURO-FORMOTEROL FUM 100-5 MCG/ACT IN AERO
2.0000 | INHALATION_SPRAY | Freq: Two times a day (BID) | RESPIRATORY_TRACT | Status: DC
  Administered 2015-03-29 – 2015-03-30 (×3): 2 via RESPIRATORY_TRACT
  Filled 2015-03-29: qty 8.8

## 2015-03-29 MED ORDER — IPRATROPIUM-ALBUTEROL 0.5-2.5 (3) MG/3ML IN SOLN
3.0000 mL | Freq: Two times a day (BID) | RESPIRATORY_TRACT | Status: DC
  Administered 2015-03-29 – 2015-03-30 (×2): 3 mL via RESPIRATORY_TRACT
  Filled 2015-03-29: qty 42
  Filled 2015-03-29: qty 3

## 2015-03-29 MED ORDER — IPRATROPIUM-ALBUTEROL 0.5-2.5 (3) MG/3ML IN SOLN: 3.0000 mL | Freq: Four times a day (QID) | RESPIRATORY_TRACT | Status: DC | PRN

## 2015-03-29 MED ORDER — SODIUM CHLORIDE 0.9 % IJ SOLN
3.0000 mL | Freq: Two times a day (BID) | INTRAMUSCULAR | Status: DC
  Administered 2015-03-29 – 2015-03-30 (×4): 3 mL via INTRAVENOUS

## 2015-03-29 MED ORDER — SERTRALINE HCL 50 MG PO TABS
50.0000 mg | ORAL_TABLET | Freq: Every day | ORAL | Status: DC
  Administered 2015-03-29 (×2): 50 mg via ORAL
  Filled 2015-03-29 (×3): qty 1

## 2015-03-29 MED ORDER — GUAIFENESIN-DM 100-10 MG/5ML PO SYRP
5.0000 mL | ORAL_SOLUTION | ORAL | Status: DC | PRN
  Filled 2015-03-29: qty 5

## 2015-03-29 MED ORDER — HYDRALAZINE HCL 20 MG/ML IJ SOLN
10.0000 mg | Freq: Four times a day (QID) | INTRAMUSCULAR | Status: DC | PRN
  Administered 2015-03-29: 10 mg via INTRAVENOUS
  Filled 2015-03-29: qty 1

## 2015-03-29 MED ORDER — ENOXAPARIN SODIUM 40 MG/0.4ML ~~LOC~~ SOLN
40.0000 mg | SUBCUTANEOUS | Status: DC
  Administered 2015-03-29 – 2015-03-30 (×2): 40 mg via SUBCUTANEOUS
  Filled 2015-03-29 (×3): qty 0.4

## 2015-03-29 MED ORDER — CARVEDILOL 25 MG PO TABS
25.0000 mg | ORAL_TABLET | Freq: Two times a day (BID) | ORAL | Status: DC
  Administered 2015-03-29 (×2): 25 mg via ORAL
  Filled 2015-03-29 (×5): qty 1

## 2015-03-29 MED ORDER — ROSUVASTATIN CALCIUM 10 MG PO TABS
10.0000 mg | ORAL_TABLET | Freq: Every day | ORAL | Status: DC
  Administered 2015-03-29 (×2): 10 mg via ORAL
  Filled 2015-03-29 (×3): qty 1

## 2015-03-29 NOTE — Progress Notes (Signed)
Echocardiogram 2D Echocardiogram has been performed.  Valarie Cones 03/29/2015, 12:28 PM
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
## 2015-03-29 NOTE — ED Notes (Signed)
Unable to get actual weight on this pt, he is unable to stand up at this time.

## 2015-03-29 NOTE — Progress Notes (Signed)
On-call provider Fredirick Maudlin, NP notified of EKG result via text-page.

## 2015-03-30 DIAGNOSIS — E119 Type 2 diabetes mellitus without complications: Secondary | ICD-10-CM

## 2015-03-30 DIAGNOSIS — N182 Chronic kidney disease, stage 2 (mild): Secondary | ICD-10-CM | POA: Diagnosis not present

## 2015-03-30 DIAGNOSIS — I5032 Chronic diastolic (congestive) heart failure: Secondary | ICD-10-CM | POA: Diagnosis not present

## 2015-03-30 DIAGNOSIS — J441 Chronic obstructive pulmonary disease with (acute) exacerbation: Secondary | ICD-10-CM

## 2015-03-30 LAB — COMPREHENSIVE METABOLIC PANEL
ALT: 14 U/L — ABNORMAL LOW (ref 17–63)
AST: 17 U/L (ref 15–41)
Albumin: 2.8 g/dL — ABNORMAL LOW (ref 3.5–5.0)
Alkaline Phosphatase: 48 U/L (ref 38–126)
Anion gap: 6 (ref 5–15)
BUN: 26 mg/dL — ABNORMAL HIGH (ref 6–20)
CALCIUM: 8.5 mg/dL — AB (ref 8.9–10.3)
CO2: 24 mmol/L (ref 22–32)
Chloride: 103 mmol/L (ref 101–111)
Creatinine, Ser: 1.18 mg/dL (ref 0.61–1.24)
GFR calc Af Amer: >60 mL/min (ref >60)
GFR, EST NON AFRICAN AMERICAN: 55 mL/min — AB (ref >60)
GLUCOSE: 269 mg/dL — AB (ref 65–99)
POTASSIUM: 3.7 mmol/L (ref 3.5–5.1)
Sodium: 133 mmol/L — ABNORMAL LOW (ref 135–145)
Total Bilirubin: 0.6 mg/dL (ref 0.3–1.2)
Total Protein: 5.4 g/dL — ABNORMAL LOW (ref 6.5–8.1)

## 2015-03-30 LAB — CBC
HCT: 25.8 % — ABNORMAL LOW (ref 39.0–52.0)
Hemoglobin: 8.7 g/dL — ABNORMAL LOW (ref 13.0–17.0)
MCH: 30.1 pg (ref 26.0–34.0)
MCHC: 33.7 g/dL (ref 30.0–36.0)
MCV: 89.3 fL (ref 78.0–100.0)
Platelets: 200 10*3/uL (ref 150–400)
RBC: 2.89 MIL/uL — ABNORMAL LOW (ref 4.22–5.81)
RDW: 16.1 % — AB (ref 11.5–15.5)
WBC: 4.7 10*3/uL (ref 4.0–10.5)

## 2015-03-30 LAB — HEMOGLOBIN A1C
Hgb A1c MFr Bld: 5.5 % (ref 4.8–5.6)
Mean Plasma Glucose: 111 mg/dL

## 2015-03-30 LAB — GLUCOSE, CAPILLARY: Glucose-Capillary: 237 mg/dL — ABNORMAL HIGH (ref 65–99)

## 2015-03-30 MED ORDER — POTASSIUM CHLORIDE ER 10 MEQ PO TBCR: 10.0000 meq | EXTENDED_RELEASE_TABLET | Freq: Every day | ORAL | Status: DC

## 2015-03-30 MED ORDER — IPRATROPIUM-ALBUTEROL 0.5-2.5 (3) MG/3ML IN SOLN: 3.0000 mL | Freq: Four times a day (QID) | RESPIRATORY_TRACT | Status: DC | PRN

## 2015-03-30 MED ORDER — PANTOPRAZOLE SODIUM 40 MG PO TBEC: 40.0000 mg | DELAYED_RELEASE_TABLET | Freq: Two times a day (BID) | ORAL | Status: DC

## 2015-03-30 MED ORDER — PREDNISONE 20 MG PO TABS: 40.0000 mg | ORAL_TABLET | Freq: Every day | ORAL | Status: DC

## 2015-03-30 MED ORDER — MOMETASONE FURO-FORMOTEROL FUM 100-5 MCG/ACT IN AERO: 2.0000 | INHALATION_SPRAY | Freq: Two times a day (BID) | RESPIRATORY_TRACT | Status: DC

## 2015-03-30 MED ORDER — FUROSEMIDE 40 MG PO TABS: 40.0000 mg | ORAL_TABLET | Freq: Every day | ORAL | Status: DC

## 2015-03-30 MED ORDER — TIOTROPIUM BROMIDE MONOHYDRATE 18 MCG IN CAPS: 18.0000 ug | ORAL_CAPSULE | Freq: Every day | RESPIRATORY_TRACT | Status: DC

## 2015-03-30 MED ORDER — DOCUSATE SODIUM 100 MG PO CAPS: 100.0000 mg | ORAL_CAPSULE | Freq: Two times a day (BID) | ORAL | Status: AC

## 2015-03-30 NOTE — Progress Notes (Signed)
[facility] discharging [facility] to [facility], iv dc'd report called to facility, to [facility]

## 2015-03-30 NOTE — Clinical Social Work Note (Addendum)
Per MD patient ready to DC back to [facility]. RN, patient/family ([name]), and facility notified of patient's DC. RN given number for report. DC packet on patient's chart. [name] is transporting patient. CSW signing off at this time.   Liz Beach MSW, [place], [place], [phone]
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
## 2015-03-30 NOTE — Care Management Note (Signed)
Case Management Note  Patient Details  Name: [name] MRN: [id number] Date of Birth: 07-13-1929  Subjective/Objective:     Patient is from [facility], patient was active with [facility] for  [place], RN for wound care, and HHOT, will resume when he returns.  Patient is for dc today. Orders for [facility] services to resume is on patient's shadow chart to be put into packet for dc.  [facility] aware that patient is for dc today.            Action/Plan: Return to ALF with [facility] services.  Expected Discharge Date:                  Expected Discharge Plan:  Assisted Living / Rest Home  In-House Referral:  Clinical Social Work  Discharge planning Services  CM Consult  Post Acute Care Choice:  Resumption of Svcs/PTA Provider Choice offered to:  Patient  DME Arranged:    DME Agency:     HH Arranged:  RN, PT, OT HH Agency:  [facility]  Status of Service:  Completed, signed off  Medicare Important Message Given:  No Date Medicare IM Given:    Medicare IM give by:    Date Additional Medicare IM Given:    Additional Medicare Important Message give by:     If discussed at [place] of Stay Meetings, dates discussed:    Additional Comments:  Zenon Mayo, RN 03/30/2015, 12:11 PM

## 2015-03-30 NOTE — Clinical Social Work Note (Signed)
Patient from [facility]. CSW notified today that the patient is ready to return to [facility] today. CSW unable to complete full assessment. Dana Allan with facility states the facility is ready for the patient to return. [name] is transporting patient back to ALF with DC packet. CSW signing off.  Liz Beach MSW, [place], [place], [phone]

## 2015-03-30 NOTE — Evaluation (Addendum)
Physical Therapy Evaluation Patient Details Name: [name] MRN: [id number] DOB: 10/26/1929 Today's Date: 03/30/2015   History of Present Illness  Pt is an 81 yo male admitted with COPD exacerbation. Recent admission 5/8 with UTI from SNF.  Clinical Impression  Pt mildly confused. I believe pt admitted from SNF however pt reporting he lives at home with caregiver. Pt con't to have decreased activity tolerance due to cardiopulmonary condition. Pt with SOB with all mobility and has limited ambulation tolerance.Pt also at increased falls risk. Recommend 24/7 assist/supervision. Can return to ALF if 24/7 services can be provided.   Follow Up Recommendations Supervision/Assistance - 24 hour, can return to ALF    Equipment Recommendations  None recommended by PT    Recommendations for Other Services       Precautions / Restrictions Precautions Precautions: Fall Restrictions Weight Bearing Restrictions: No      Mobility  Bed Mobility Overal bed mobility: Needs Assistance Bed Mobility: Supine to Sit     Supine to sit: Max assist;HOB elevated     General bed mobility comments: pt initiated moving LEs off EOB however required maxA to bring trunk upright and scoot hips to EOB  Transfers Overall transfer level: Needs assistance Equipment used: Rolling walker (2 wheeled) Transfers: Sit to/from Stand Sit to Stand: Min assist         General transfer comment: v/c's for hand placement, increased time, minA to initiate anterior weight transition  Ambulation/Gait Ambulation/Gait assistance: Min assist Ambulation Distance (Feet): 15 Feet Assistive device: Rolling walker (2 wheeled) Gait Pattern/deviations: Step-through pattern;Decreased stride length;Shuffle Gait velocity: slow Gait velocity interpretation: <1.8 ft/sec, indicative of risk for recurrent falls General Gait Details: slow, unsteady, + SOB (unable to get SpO2 reading)  Stairs            Wheelchair  Mobility    Modified Rankin (Stroke Patients Only)       Balance Overall balance assessment: Needs assistance Sitting-balance support: Feet supported;No upper extremity supported Sitting balance-Leahy Scale: Fair (+ posterior lean during MMT of LEs)     Standing balance support: Bilateral upper extremity supported Standing balance-Leahy Scale: Poor Standing balance comment: relient on RW                             Pertinent Vitals/Pain Pain Assessment: No/denies pain    Home Living Family/patient expects to be discharged to:: Skilled nursing facility                 Additional Comments: unclear if pt was at SNF or home PTA. suspect per chart although pt reports he has a caregiver    Prior Function Level of Independence: Needs assistance   Gait / Transfers Assistance Needed: uses RW  ADL's / Homemaking Assistance Needed: assist with dressing/bathing, food prep        Hand Dominance   Dominant Hand: Right    Extremity/Trunk Assessment   Upper Extremity Assessment: Generalized weakness           Lower Extremity Assessment: Generalized weakness      Cervical / Trunk Assessment: Kyphotic  Communication   Communication: No difficulties  Cognition Arousal/Alertness: Awake/alert Behavior During Therapy: WFL for tasks assessed/performed Overall Cognitive Status: Impaired/Different from baseline (however appears mildly confused) Area of Impairment: Orientation;Safety/judgement;Awareness;Problem solving Orientation Level: Disoriented to;Time   Memory: Decreased short-term memory   Safety/Judgement: Decreased awareness of safety;Decreased awareness of deficits   Problem Solving: Slow processing;Requires verbal cues;Requires  tactile cues General Comments: unaware of date, pt reports he lives at home with his caregiver but she can't be there all the time because she has a job but I believe pt was at a SNF according to the chart. pt poor  historian    General Comments      Exercises        Assessment/Plan    PT Assessment Patient needs continued PT services  PT Diagnosis Generalized weakness;Difficulty walking   PT Problem List Decreased strength;Cardiopulmonary status limiting activity;Decreased activity tolerance;Decreased balance;Decreased mobility  PT Treatment Interventions Balance training;Patient/family education;Functional mobility training;Therapeutic activities;Therapeutic exercise;Gait training;DME instruction   PT Goals (Current goals can be found in the Care Plan section) Acute Rehab PT Goals Patient Stated Goal: none stated PT Goal Formulation: With patient Time For Goal Achievement: 04/06/15 Potential to Achieve Goals: Fair    Frequency Min 2X/week   Barriers to discharge        Co-evaluation               End of Session Equipment Utilized During Treatment: Gait belt Activity Tolerance: Patient limited by fatigue;Other (comment) Patient left: in chair;with call bell/phone within reach Nurse Communication: Mobility status    Functional Assessment Tool Used: clinical judement Functional Limitation: Mobility: Walking and moving around Mobility: Walking and Moving Around Current Status (C1275): At least 20 percent but less than 40 percent impaired, limited or restricted Mobility: Walking and Moving Around Goal Status [phone]): At least 1 percent but less than 20 percent impaired, limited or restricted    Time: 7494-4967 PT Time Calculation (min) (ACUTE ONLY): 27 min   Charges:   PT Evaluation $Initial PT Evaluation Tier I: 1 Procedure PT Treatments $Gait Training: 8-22 mins   PT G Codes:   PT G-Codes **NOT FOR INPATIENT CLASS** Functional Assessment Tool Used: clinical judement Functional Limitation: Mobility: Walking and moving around Mobility: Walking and Moving Around Current Status (R9163): At least 20 percent but less than 40 percent impaired, limited or  restricted Mobility: Walking and Moving Around Goal Status [phone]): At least 1 percent but less than 20 percent impaired, limited or restricted    Kingsley Callander 03/30/2015, 11:02 AM   Kittie Plater, PT, DPT Pager #: [phone] Office #: [phone]

## 2015-03-31 LAB — URINE CULTURE
Colony Count: NO GROWTH
Culture: NO GROWTH

## 2015-04-01 ENCOUNTER — Emergency Department (HOSPITAL_BASED_OUTPATIENT_CLINIC_OR_DEPARTMENT_OTHER)
Admission: EM | Admit: 2015-04-01 | Discharge: 2015-04-02 | Disposition: A | Discharge status: Home / Self Care | Payer: Medicare Other | Attending: Emergency Medicine | Admitting: Emergency Medicine

## 2015-04-01 ENCOUNTER — Encounter (HOSPITAL_BASED_OUTPATIENT_CLINIC_OR_DEPARTMENT_OTHER): Payer: Self-pay | Admitting: *Deleted

## 2015-04-01 DIAGNOSIS — Z79899 Other long term (current) drug therapy: Secondary | ICD-10-CM | POA: Insufficient documentation

## 2015-04-01 DIAGNOSIS — K219 Gastro-esophageal reflux disease without esophagitis: Secondary | ICD-10-CM | POA: Insufficient documentation

## 2015-04-01 DIAGNOSIS — Z8619 Personal history of other infectious and parasitic diseases: Secondary | ICD-10-CM | POA: Diagnosis not present

## 2015-04-01 DIAGNOSIS — I2510 Atherosclerotic heart disease of native coronary artery without angina pectoris: Secondary | ICD-10-CM | POA: Insufficient documentation

## 2015-04-01 DIAGNOSIS — Z7982 Long term (current) use of aspirin: Secondary | ICD-10-CM | POA: Insufficient documentation

## 2015-04-01 DIAGNOSIS — E119 Type 2 diabetes mellitus without complications: Secondary | ICD-10-CM | POA: Diagnosis not present

## 2015-04-01 DIAGNOSIS — Z9861 Coronary angioplasty status: Secondary | ICD-10-CM | POA: Insufficient documentation

## 2015-04-01 DIAGNOSIS — E785 Hyperlipidemia, unspecified: Secondary | ICD-10-CM | POA: Diagnosis not present

## 2015-04-01 DIAGNOSIS — J449 Chronic obstructive pulmonary disease, unspecified: Secondary | ICD-10-CM | POA: Diagnosis not present

## 2015-04-01 DIAGNOSIS — M1990 Unspecified osteoarthritis, unspecified site: Secondary | ICD-10-CM | POA: Insufficient documentation

## 2015-04-01 DIAGNOSIS — I5032 Chronic diastolic (congestive) heart failure: Secondary | ICD-10-CM | POA: Diagnosis not present

## 2015-04-01 DIAGNOSIS — Z8546 Personal history of malignant neoplasm of prostate: Secondary | ICD-10-CM | POA: Insufficient documentation

## 2015-04-01 DIAGNOSIS — Z85828 Personal history of other malignant neoplasm of skin: Secondary | ICD-10-CM | POA: Insufficient documentation

## 2015-04-01 DIAGNOSIS — Z87828 Personal history of other (healed) physical injury and trauma: Secondary | ICD-10-CM | POA: Insufficient documentation

## 2015-04-01 DIAGNOSIS — I10 Essential (primary) hypertension: Secondary | ICD-10-CM | POA: Diagnosis not present

## 2015-04-01 DIAGNOSIS — R339 Retention of urine, unspecified: Secondary | ICD-10-CM

## 2015-04-01 DIAGNOSIS — N3090 Cystitis, unspecified without hematuria: Secondary | ICD-10-CM | POA: Diagnosis not present

## 2015-04-01 DIAGNOSIS — R300 Dysuria: Secondary | ICD-10-CM | POA: Diagnosis present

## 2015-04-01 DIAGNOSIS — I252 Old myocardial infarction: Secondary | ICD-10-CM | POA: Diagnosis not present

## 2015-04-01 NOTE — ED Notes (Signed)
Daughter states increased painful urination x 1 day. Started new med x 1 day ago [employer]

## 2015-04-01 NOTE — ED Notes (Signed)
C/o pain w urination and diff urinating   Bladder scan showing 803 ml of urine in bladder
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
## 2015-04-02 DIAGNOSIS — N3090 Cystitis, unspecified without hematuria: Secondary | ICD-10-CM | POA: Diagnosis not present

## 2015-04-02 LAB — CBC WITH DIFFERENTIAL/PLATELET
BASOS PCT: 0 % (ref 0–1)
Basophils Absolute: 0 10*3/uL (ref 0.0–0.1)
EOS PCT: 1 % (ref 0–5)
Eosinophils Absolute: 0.1 10*3/uL (ref 0.0–0.7)
HCT: 29.8 % — ABNORMAL LOW (ref 39.0–52.0)
Hemoglobin: 9.7 g/dL — ABNORMAL LOW (ref 13.0–17.0)
Lymphocytes Relative: 6 % — ABNORMAL LOW (ref 12–46)
Lymphs Abs: 0.2 10*3/uL — ABNORMAL LOW (ref 0.7–4.0)
MCH: 30.1 pg (ref 26.0–34.0)
MCHC: 32.6 g/dL (ref 30.0–36.0)
MCV: 92.5 fL (ref 78.0–100.0)
Monocytes Absolute: 0.4 10*3/uL (ref 0.1–1.0)
Monocytes Relative: 11 % (ref 3–12)
NEUTROS ABS: 3.3 10*3/uL (ref 1.7–7.7)
Neutrophils Relative %: 82 % — ABNORMAL HIGH (ref 43–77)
PLATELETS: 202 10*3/uL (ref 150–400)
RBC: 3.22 MIL/uL — AB (ref 4.22–5.81)
RDW: 15.8 % — ABNORMAL HIGH (ref 11.5–15.5)
WBC: 4 10*3/uL (ref 4.0–10.5)

## 2015-04-02 LAB — BASIC METABOLIC PANEL
ANION GAP: 8 (ref 5–15)
BUN: 28 mg/dL — ABNORMAL HIGH (ref 6–20)
CO2: 26 mmol/L (ref 22–32)
Calcium: 8.8 mg/dL — ABNORMAL LOW (ref 8.9–10.3)
Chloride: 105 mmol/L (ref 101–111)
Creatinine, Ser: 1.03 mg/dL (ref 0.61–1.24)
GFR calc Af Amer: >60 mL/min (ref >60)
GFR calc non Af Amer: >60 mL/min (ref >60)
Glucose, Bld: 164 mg/dL — ABNORMAL HIGH (ref 65–99)
Potassium: 4.1 mmol/L (ref 3.5–5.1)
Sodium: 139 mmol/L (ref 135–145)

## 2015-04-02 LAB — URINALYSIS, ROUTINE W REFLEX MICROSCOPIC
BILIRUBIN URINE: NEGATIVE
Glucose, UA: NEGATIVE mg/dL
Hgb urine dipstick: NEGATIVE
Ketones, ur: NEGATIVE mg/dL
Nitrite: NEGATIVE
Protein, ur: NEGATIVE mg/dL
SPECIFIC GRAVITY, URINE: 1.01 (ref 1.005–1.030)
UROBILINOGEN UA: 0.2 mg/dL (ref 0.0–1.0)
pH: 6 (ref 5.0–8.0)

## 2015-04-02 LAB — URINE MICROSCOPIC-ADD ON

## 2015-04-02 MED ORDER — TAMSULOSIN HCL 0.4 MG PO CAPS: 0.4000 mg | ORAL_CAPSULE | Freq: Every day | ORAL | Status: AC

## 2015-04-02 MED ORDER — NITROFURANTOIN MONOHYD MACRO 100 MG PO CAPS: 100.0000 mg | ORAL_CAPSULE | Freq: Two times a day (BID) | ORAL | Status: DC

## 2015-04-02 NOTE — Discharge Instructions (Signed)
Acute Urinary Retention °Acute urinary retention is the temporary inability to urinate. °This is a common problem in older men. As men age their prostates become larger and block the flow of urine from the bladder. This is usually a problem that has come on gradually.  °HOME CARE INSTRUCTIONS °If you are sent home with a Foley catheter and a drainage system, you will need to discuss the best course of action with your health care provider. While the catheter is in, maintain a good intake of fluids. Keep the drainage bag emptied and lower than your catheter. This is so that contaminated urine will not flow back into your bladder, which could lead to a urinary tract infection. °There are two main types of drainage bags. One is a large bag that usually is used at night. It has a good capacity that will allow you to sleep through the night without having to empty it. The second type is called a leg bag. It has a smaller capacity, so it needs to be emptied more frequently. However, the main advantage is that it can be attached by a leg strap and can go underneath your clothing, allowing you the freedom to move about or leave your home. °Only take over-the-counter or prescription medicines for pain, discomfort, or fever as directed by your health care provider.  °SEEK MEDICAL CARE IF: °· You develop a low-grade fever. °· You experience spasms or leakage of urine with the spasms. °SEEK IMMEDIATE MEDICAL CARE IF:  °· You develop chills or fever. °· Your catheter stops draining urine. °· Your catheter falls out. °· You start to develop increased bleeding that does not respond to rest and increased fluid intake. °MAKE SURE YOU: °· Understand these instructions. °· Will watch your condition. °· Will get help right away if you are not doing well or get worse. °Document Released: 01/30/2001 Document Revised: 10/29/2013 Document Reviewed: 04/04/2013 °[employer]® Patient Information ©2015 [employer]. This information is not  intended to replace advice given to you by your health care provider. Make sure you discuss any questions you have with your health care provider. ° °

## 2015-04-03 LAB — URINE CULTURE
CULTURE: NO GROWTH
Colony Count: NO GROWTH

## 2015-04-17 ENCOUNTER — Encounter (HOSPITAL_COMMUNITY): Payer: Self-pay | Admitting: Emergency Medicine

## 2015-04-17 ENCOUNTER — Inpatient Hospital Stay (HOSPITAL_COMMUNITY)
Admission: EM | Admit: 2015-04-17 | Discharge: 2015-04-20 | DRG: 190 | Disposition: A | Discharge status: Home / Self Care | Payer: Medicare Other | Attending: Internal Medicine | Admitting: Internal Medicine

## 2015-04-17 ENCOUNTER — Emergency Department (HOSPITAL_COMMUNITY): Payer: Medicare Other

## 2015-04-17 DIAGNOSIS — E1165 Type 2 diabetes mellitus with hyperglycemia: Secondary | ICD-10-CM | POA: Diagnosis present

## 2015-04-17 DIAGNOSIS — J9601 Acute respiratory failure with hypoxia: Secondary | ICD-10-CM | POA: Diagnosis present

## 2015-04-17 DIAGNOSIS — J441 Chronic obstructive pulmonary disease with (acute) exacerbation: Secondary | ICD-10-CM | POA: Diagnosis present

## 2015-04-17 DIAGNOSIS — Z79899 Other long term (current) drug therapy: Secondary | ICD-10-CM | POA: Diagnosis not present

## 2015-04-17 DIAGNOSIS — N179 Acute kidney failure, unspecified: Secondary | ICD-10-CM

## 2015-04-17 DIAGNOSIS — I252 Old myocardial infarction: Secondary | ICD-10-CM

## 2015-04-17 DIAGNOSIS — I10 Essential (primary) hypertension: Secondary | ICD-10-CM | POA: Diagnosis present

## 2015-04-17 DIAGNOSIS — E785 Hyperlipidemia, unspecified: Secondary | ICD-10-CM | POA: Diagnosis present

## 2015-04-17 DIAGNOSIS — Z85828 Personal history of other malignant neoplasm of skin: Secondary | ICD-10-CM | POA: Diagnosis not present

## 2015-04-17 DIAGNOSIS — K219 Gastro-esophageal reflux disease without esophagitis: Secondary | ICD-10-CM | POA: Diagnosis present

## 2015-04-17 DIAGNOSIS — Z7952 Long term (current) use of systemic steroids: Secondary | ICD-10-CM | POA: Diagnosis not present

## 2015-04-17 DIAGNOSIS — Z7982 Long term (current) use of aspirin: Secondary | ICD-10-CM

## 2015-04-17 DIAGNOSIS — Z8249 Family history of ischemic heart disease and other diseases of the circulatory system: Secondary | ICD-10-CM | POA: Diagnosis not present

## 2015-04-17 DIAGNOSIS — I2510 Atherosclerotic heart disease of native coronary artery without angina pectoris: Secondary | ICD-10-CM | POA: Diagnosis present

## 2015-04-17 DIAGNOSIS — R739 Hyperglycemia, unspecified: Secondary | ICD-10-CM | POA: Diagnosis not present

## 2015-04-17 DIAGNOSIS — M1990 Unspecified osteoarthritis, unspecified site: Secondary | ICD-10-CM | POA: Diagnosis present

## 2015-04-17 DIAGNOSIS — Z825 Family history of asthma and other chronic lower respiratory diseases: Secondary | ICD-10-CM

## 2015-04-17 DIAGNOSIS — I255 Ischemic cardiomyopathy: Secondary | ICD-10-CM | POA: Diagnosis present

## 2015-04-17 DIAGNOSIS — E860 Dehydration: Secondary | ICD-10-CM | POA: Diagnosis present

## 2015-04-17 DIAGNOSIS — T380X5A Adverse effect of glucocorticoids and synthetic analogues, initial encounter: Secondary | ICD-10-CM | POA: Diagnosis present

## 2015-04-17 DIAGNOSIS — Z8546 Personal history of malignant neoplasm of prostate: Secondary | ICD-10-CM

## 2015-04-17 DIAGNOSIS — R0602 Shortness of breath: Secondary | ICD-10-CM

## 2015-04-17 DIAGNOSIS — J209 Acute bronchitis, unspecified: Secondary | ICD-10-CM

## 2015-04-17 DIAGNOSIS — Z7902 Long term (current) use of antithrombotics/antiplatelets: Secondary | ICD-10-CM | POA: Diagnosis not present

## 2015-04-17 DIAGNOSIS — B399 Histoplasmosis, unspecified: Secondary | ICD-10-CM

## 2015-04-17 DIAGNOSIS — I5032 Chronic diastolic (congestive) heart failure: Secondary | ICD-10-CM | POA: Diagnosis present

## 2015-04-17 DIAGNOSIS — I509 Heart failure, unspecified: Secondary | ICD-10-CM

## 2015-04-17 DIAGNOSIS — J440 Chronic obstructive pulmonary disease with acute lower respiratory infection: Secondary | ICD-10-CM

## 2015-04-17 DIAGNOSIS — J449 Chronic obstructive pulmonary disease, unspecified: Secondary | ICD-10-CM | POA: Diagnosis present

## 2015-04-17 LAB — I-STAT TROPONIN, ED: TROPONIN I, POC: 0 ng/mL (ref 0.00–0.08)

## 2015-04-17 LAB — BRAIN NATRIURETIC PEPTIDE: B Natriuretic Peptide: 653.6 pg/mL — ABNORMAL HIGH (ref 0.0–100.0)

## 2015-04-17 LAB — COMPREHENSIVE METABOLIC PANEL
ALT: 17 U/L (ref 17–63)
AST: 21 U/L (ref 15–41)
Albumin: 3.1 g/dL — ABNORMAL LOW (ref 3.5–5.0)
Alkaline Phosphatase: 56 U/L (ref 38–126)
Anion gap: 10 (ref 5–15)
BUN: 23 mg/dL — ABNORMAL HIGH (ref 6–20)
CO2: 21 mmol/L — ABNORMAL LOW (ref 22–32)
Calcium: 8.5 mg/dL — ABNORMAL LOW (ref 8.9–10.3)
Chloride: 105 mmol/L (ref 101–111)
Creatinine, Ser: 1.25 mg/dL — ABNORMAL HIGH (ref 0.61–1.24)
GFR calc Af Amer: 59 mL/min — ABNORMAL LOW (ref >60)
GFR calc non Af Amer: 51 mL/min — ABNORMAL LOW (ref >60)
Glucose, Bld: 245 mg/dL — ABNORMAL HIGH (ref 65–99)
Potassium: 4.4 mmol/L (ref 3.5–5.1)
Sodium: 136 mmol/L (ref 135–145)
TOTAL PROTEIN: 5.7 g/dL — AB (ref 6.5–8.1)
Total Bilirubin: 0.8 mg/dL (ref 0.3–1.2)

## 2015-04-17 LAB — CBC WITH DIFFERENTIAL/PLATELET
Basophils Absolute: 0 10*3/uL (ref 0.0–0.1)
Basophils Relative: 0 % (ref 0–1)
EOS PCT: 2 % (ref 0–5)
Eosinophils Absolute: 0.1 10*3/uL (ref 0.0–0.7)
HEMATOCRIT: 27.7 % — AB (ref 39.0–52.0)
HEMOGLOBIN: 9.3 g/dL — AB (ref 13.0–17.0)
LYMPHS ABS: 0.2 10*3/uL — AB (ref 0.7–4.0)
Lymphocytes Relative: 5 % — ABNORMAL LOW (ref 12–46)
MCH: 30.3 pg (ref 26.0–34.0)
MCHC: 33.6 g/dL (ref 30.0–36.0)
MCV: 90.2 fL (ref 78.0–100.0)
Monocytes Absolute: 0.4 10*3/uL (ref 0.1–1.0)
Monocytes Relative: 8 % (ref 3–12)
NEUTROS ABS: 4.3 10*3/uL (ref 1.7–7.7)
Neutrophils Relative %: 85 % — ABNORMAL HIGH (ref 43–77)
PLATELETS: 174 10*3/uL (ref 150–400)
RBC: 3.07 MIL/uL — ABNORMAL LOW (ref 4.22–5.81)
RDW: 15.6 % — ABNORMAL HIGH (ref 11.5–15.5)
WBC: 5 10*3/uL (ref 4.0–10.5)

## 2015-04-17 MED ORDER — METHYLPREDNISOLONE SODIUM SUCC 125 MG IJ SOLR
125.0000 mg | Freq: Once | INTRAMUSCULAR | Status: AC
  Administered 2015-04-17: 125 mg via INTRAVENOUS
  Filled 2015-04-17: qty 2

## 2015-04-17 MED ORDER — IPRATROPIUM-ALBUTEROL 0.5-2.5 (3) MG/3ML IN SOLN
3.0000 mL | Freq: Once | RESPIRATORY_TRACT | Status: AC
  Administered 2015-04-17: 3 mL via RESPIRATORY_TRACT
  Filled 2015-04-17: qty 3

## 2015-04-17 MED ORDER — SODIUM CHLORIDE 0.9 % IV BOLUS (SEPSIS)
500.0000 mL | Freq: Once | INTRAVENOUS | Status: AC
  Administered 2015-04-17: 500 mL via INTRAVENOUS

## 2015-04-17 NOTE — ED Notes (Signed)
Pt arrives from [facility] at [facility], daughter noted increased WOB, one neb tx at home, 5 albuterol and .5 atrovent by EMS. No IV drugs.

## 2015-04-17 NOTE — ED Notes (Signed)
Pt placed in a gown and hooked up to the monitor with a 5 lead, BP cuff and pulse ox 
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
## 2015-04-17 NOTE — ED Notes (Signed)
HCPOA at bedside
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
## 2015-04-18 DIAGNOSIS — N179 Acute kidney failure, unspecified: Secondary | ICD-10-CM

## 2015-04-18 DIAGNOSIS — I10 Essential (primary) hypertension: Secondary | ICD-10-CM

## 2015-04-18 DIAGNOSIS — J9601 Acute respiratory failure with hypoxia: Secondary | ICD-10-CM

## 2015-04-18 DIAGNOSIS — J441 Chronic obstructive pulmonary disease with (acute) exacerbation: Principal | ICD-10-CM

## 2015-04-18 DIAGNOSIS — R739 Hyperglycemia, unspecified: Secondary | ICD-10-CM

## 2015-04-18 LAB — GLUCOSE, CAPILLARY
GLUCOSE-CAPILLARY: 303 mg/dL — AB (ref 65–99)
GLUCOSE-CAPILLARY: 227 mg/dL — AB (ref 65–99)
Glucose-Capillary: 368 mg/dL — ABNORMAL HIGH (ref 65–99)

## 2015-04-18 LAB — BASIC METABOLIC PANEL
Anion gap: 8 (ref 5–15)
BUN: 24 mg/dL — ABNORMAL HIGH (ref 6–20)
CHLORIDE: 105 mmol/L (ref 101–111)
CO2: 24 mmol/L (ref 22–32)
CREATININE: 1.18 mg/dL (ref 0.61–1.24)
Calcium: 8.6 mg/dL — ABNORMAL LOW (ref 8.9–10.3)
GFR calc Af Amer: >60 mL/min (ref >60)
GFR calc non Af Amer: 55 mL/min — ABNORMAL LOW (ref >60)
Glucose, Bld: 303 mg/dL — ABNORMAL HIGH (ref 65–99)
Potassium: 4.5 mmol/L (ref 3.5–5.1)
Sodium: 137 mmol/L (ref 135–145)

## 2015-04-18 LAB — CBC
HCT: 28.4 % — ABNORMAL LOW (ref 39.0–52.0)
Hemoglobin: 9.5 g/dL — ABNORMAL LOW (ref 13.0–17.0)
MCH: 30.3 pg (ref 26.0–34.0)
MCHC: 33.5 g/dL (ref 30.0–36.0)
MCV: 90.4 fL (ref 78.0–100.0)
Platelets: 159 10*3/uL (ref 150–400)
RBC: 3.14 MIL/uL — ABNORMAL LOW (ref 4.22–5.81)
RDW: 15.7 % — ABNORMAL HIGH (ref 11.5–15.5)
WBC: 4.1 10*3/uL (ref 4.0–10.5)

## 2015-04-18 LAB — TSH: TSH: 1.249 u[IU]/mL (ref 0.350–4.500)

## 2015-04-18 MED ORDER — AZITHROMYCIN 500 MG PO TABS
500.0000 mg | ORAL_TABLET | Freq: Every day | ORAL | Status: AC
  Administered 2015-04-18: 500 mg via ORAL
  Filled 2015-04-18: qty 1

## 2015-04-18 MED ORDER — FUROSEMIDE 40 MG PO TABS
40.0000 mg | ORAL_TABLET | Freq: Every day | ORAL | Status: DC
  Administered 2015-04-18 – 2015-04-20 (×3): 40 mg via ORAL
  Filled 2015-04-18 (×3): qty 1

## 2015-04-18 MED ORDER — INSULIN ASPART 100 UNIT/ML ~~LOC~~ SOLN
0.0000 [IU] | Freq: Every day | SUBCUTANEOUS | Status: DC
  Administered 2015-04-18 – 2015-04-19 (×2): 2 [IU] via SUBCUTANEOUS

## 2015-04-18 MED ORDER — METHYLPREDNISOLONE SODIUM SUCC 125 MG IJ SOLR
80.0000 mg | Freq: Two times a day (BID) | INTRAMUSCULAR | Status: DC
  Administered 2015-04-18 (×2): 80 mg via INTRAVENOUS
  Filled 2015-04-18 (×4): qty 1.28

## 2015-04-18 MED ORDER — VITAMIN D3 25 MCG (1000 UNIT) PO TABS
2000.0000 [IU] | ORAL_TABLET | Freq: Every day | ORAL | Status: DC
  Administered 2015-04-18 – 2015-04-20 (×3): 2000 [IU] via ORAL
  Filled 2015-04-18 (×3): qty 2

## 2015-04-18 MED ORDER — INSULIN ASPART 100 UNIT/ML ~~LOC~~ SOLN
4.0000 [IU] | Freq: Three times a day (TID) | SUBCUTANEOUS | Status: DC
  Administered 2015-04-18 – 2015-04-19 (×3): 4 [IU] via SUBCUTANEOUS

## 2015-04-18 MED ORDER — ASPIRIN 81 MG PO CHEW
81.0000 mg | CHEWABLE_TABLET | Freq: Every day | ORAL | Status: DC
  Administered 2015-04-18 – 2015-04-19 (×2): 81 mg via ORAL
  Filled 2015-04-18 (×2): qty 1

## 2015-04-18 MED ORDER — ONDANSETRON HCL 4 MG/2ML IJ SOLN: 4.0000 mg | Freq: Four times a day (QID) | INTRAMUSCULAR | Status: DC | PRN

## 2015-04-18 MED ORDER — ACETAMINOPHEN 650 MG RE SUPP: 650.0000 mg | Freq: Four times a day (QID) | RECTAL | Status: DC | PRN

## 2015-04-18 MED ORDER — SODIUM CHLORIDE 0.9 % IJ SOLN: 3.0000 mL | INTRAMUSCULAR | Status: DC | PRN

## 2015-04-18 MED ORDER — ZOLPIDEM TARTRATE 5 MG PO TABS: 5.0000 mg | ORAL_TABLET | Freq: Every evening | ORAL | Status: DC | PRN

## 2015-04-18 MED ORDER — POTASSIUM CHLORIDE ER 10 MEQ PO TBCR
10.0000 meq | EXTENDED_RELEASE_TABLET | Freq: Every day | ORAL | Status: DC
  Administered 2015-04-18 – 2015-04-20 (×3): 10 meq via ORAL
  Filled 2015-04-18 (×3): qty 1

## 2015-04-18 MED ORDER — MORPHINE SULFATE 2 MG/ML IJ SOLN: 1.0000 mg | INTRAMUSCULAR | Status: DC | PRN

## 2015-04-18 MED ORDER — SODIUM CHLORIDE 0.9 % IV SOLN: 250.0000 mL | INTRAVENOUS | Status: DC | PRN

## 2015-04-18 MED ORDER — CARVEDILOL 25 MG PO TABS
25.0000 mg | ORAL_TABLET | Freq: Two times a day (BID) | ORAL | Status: DC
  Administered 2015-04-18 – 2015-04-20 (×4): 25 mg via ORAL
  Filled 2015-04-18 (×7): qty 1

## 2015-04-18 MED ORDER — HEPARIN SODIUM (PORCINE) 5000 UNIT/ML IJ SOLN
5000.0000 [IU] | Freq: Three times a day (TID) | INTRAMUSCULAR | Status: DC
  Administered 2015-04-18 – 2015-04-20 (×6): 5000 [IU] via SUBCUTANEOUS
  Filled 2015-04-18 (×9): qty 1

## 2015-04-18 MED ORDER — ROSUVASTATIN CALCIUM 10 MG PO TABS
10.0000 mg | ORAL_TABLET | Freq: Every day | ORAL | Status: DC
  Administered 2015-04-18 – 2015-04-19 (×3): 10 mg via ORAL
  Filled 2015-04-18 (×4): qty 1

## 2015-04-18 MED ORDER — AZITHROMYCIN 250 MG PO TABS
250.0000 mg | ORAL_TABLET | Freq: Every day | ORAL | Status: DC
  Administered 2015-04-18 – 2015-04-19 (×2): 250 mg via ORAL
  Filled 2015-04-18 (×3): qty 1

## 2015-04-18 MED ORDER — SODIUM CHLORIDE 0.9 % IJ SOLN
3.0000 mL | Freq: Two times a day (BID) | INTRAMUSCULAR | Status: DC
  Administered 2015-04-18 – 2015-04-19 (×5): 3 mL via INTRAVENOUS

## 2015-04-18 MED ORDER — ALBUTEROL SULFATE (2.5 MG/3ML) 0.083% IN NEBU: 2.5000 mg | INHALATION_SOLUTION | RESPIRATORY_TRACT | Status: DC | PRN

## 2015-04-18 MED ORDER — INSULIN ASPART 100 UNIT/ML ~~LOC~~ SOLN
0.0000 [IU] | Freq: Three times a day (TID) | SUBCUTANEOUS | Status: DC
  Administered 2015-04-18: 7 [IU] via SUBCUTANEOUS
  Administered 2015-04-18: 9 [IU] via SUBCUTANEOUS
  Administered 2015-04-19: 3 [IU] via SUBCUTANEOUS
  Administered 2015-04-19 (×2): 5 [IU] via SUBCUTANEOUS

## 2015-04-18 MED ORDER — ONDANSETRON HCL 4 MG PO TABS
4.0000 mg | ORAL_TABLET | Freq: Four times a day (QID) | ORAL | Status: DC | PRN
Start: 2015-04-18 — End: 2015-04-20

## 2015-04-18 MED ORDER — ACETAMINOPHEN 325 MG PO TABS: 650.0000 mg | ORAL_TABLET | Freq: Four times a day (QID) | ORAL | Status: DC | PRN

## 2015-04-18 MED ORDER — ALBUTEROL SULFATE (2.5 MG/3ML) 0.083% IN NEBU
2.5000 mg | INHALATION_SOLUTION | Freq: Four times a day (QID) | RESPIRATORY_TRACT | Status: DC
  Administered 2015-04-18: 2.5 mg via RESPIRATORY_TRACT
  Filled 2015-04-18: qty 3

## 2015-04-18 MED ORDER — TAMSULOSIN HCL 0.4 MG PO CAPS
0.4000 mg | ORAL_CAPSULE | Freq: Every day | ORAL | Status: DC
  Administered 2015-04-18 – 2015-04-20 (×3): 0.4 mg via ORAL
  Filled 2015-04-18 (×3): qty 1

## 2015-04-18 MED ORDER — TIOTROPIUM BROMIDE MONOHYDRATE 18 MCG IN CAPS
18.0000 ug | ORAL_CAPSULE | Freq: Every day | RESPIRATORY_TRACT | Status: DC
  Administered 2015-04-18 – 2015-04-20 (×3): 18 ug via RESPIRATORY_TRACT
  Filled 2015-04-18: qty 5

## 2015-04-18 MED ORDER — ISOSORBIDE MONONITRATE ER 60 MG PO TB24
60.0000 mg | ORAL_TABLET | Freq: Every day | ORAL | Status: DC
  Administered 2015-04-18 – 2015-04-20 (×3): 60 mg via ORAL
  Filled 2015-04-18 (×3): qty 1

## 2015-04-18 MED ORDER — PANTOPRAZOLE SODIUM 40 MG PO TBEC
40.0000 mg | DELAYED_RELEASE_TABLET | Freq: Two times a day (BID) | ORAL | Status: DC
  Administered 2015-04-18 – 2015-04-20 (×5): 40 mg via ORAL
  Filled 2015-04-18 (×5): qty 1

## 2015-04-18 MED ORDER — SERTRALINE HCL 50 MG PO TABS
50.0000 mg | ORAL_TABLET | Freq: Every day | ORAL | Status: DC
  Administered 2015-04-18 – 2015-04-19 (×3): 50 mg via ORAL
  Filled 2015-04-18 (×4): qty 1

## 2015-04-18 MED ORDER — CLOPIDOGREL BISULFATE 75 MG PO TABS
75.0000 mg | ORAL_TABLET | Freq: Every day | ORAL | Status: DC
  Administered 2015-04-18 – 2015-04-20 (×3): 75 mg via ORAL
  Filled 2015-04-18 (×3): qty 1

## 2015-04-18 MED ORDER — ALBUTEROL SULFATE (2.5 MG/3ML) 0.083% IN NEBU
2.5000 mg | INHALATION_SOLUTION | Freq: Three times a day (TID) | RESPIRATORY_TRACT | Status: DC
  Administered 2015-04-18 – 2015-04-19 (×4): 2.5 mg via RESPIRATORY_TRACT
  Filled 2015-04-18 (×4): qty 3

## 2015-04-18 MED ORDER — HYDROCODONE-ACETAMINOPHEN 5-325 MG PO TABS: 1.0000 | ORAL_TABLET | ORAL | Status: DC | PRN

## 2015-04-18 NOTE — Progress Notes (Addendum)
[facility] PROGRESS NOTE Assessment/Plan:  Acute respiratory failure with hypoxia due to COPD exacerbation: - He relates his respiration is unchanged from yesterday, he was started empirically with IV antibiotic steroids and inhalers. - Continue IV medication and oxygens punctation.  Essential hypertension - Blood pressure well controlled continue current therapy.   Steroid hyperglycemia: - Start sliding scale insulin.  Code Status: full Family Communication: none  Disposition Plan: home in 2- days   Consultants:  none  Procedures:  CXR  Antibiotics:  azithro  HPI/Subjective: Relates his sob is unchanged  Objective: Filed Vitals:   04/17/15 2330 04/18/15 0041 04/18/15 0320 04/18/15 0559  BP: 112/56 140/53  122/42  Pulse: 65 63  64  Temp:  98 F (36.7 C)  97.8 F (36.6 C)  TempSrc:  Oral  Oral  Resp: 22 20  18   Weight:    61.2 kg (134 lb 14.7 oz)  SpO2:  91% 92% 95%   No intake or output data in the 24 hours ending 04/18/15 0826 Filed Weights   04/18/15 0559  Weight: 61.2 kg (134 lb 14.7 oz)    Exam:  General: Alert, awake, oriented x3, in no acute distress.  HEENT: No bruits, no goiter.  Heart: Regular rate and rhythm. Lungs: Good air movement,clear Abdomen: Soft, nontender, nondistended, positive bowel sounds.  Neuro: Grossly intact, nonfocal.   Data Reviewed: Basic Metabolic Panel:  Recent Labs Lab 04/17/15 2150 04/18/15 0400  NA 136 137  K 4.4 4.5  CL 105 105  CO2 21* 24  GLUCOSE 245* 303*  BUN 23* 24*  CREATININE 1.25* 1.18  CALCIUM 8.5* 8.6*   Liver Function Tests:  Recent Labs Lab 04/17/15 2150  AST 21  ALT 17  ALKPHOS 56  BILITOT 0.8  PROT 5.7*  ALBUMIN 3.1*   No results for input(s): LIPASE, AMYLASE in the last 168 hours. No results for input(s): AMMONIA in the last 168 hours. CBC:  Recent Labs Lab 04/17/15 2150 04/18/15 0400  WBC 5.0 4.1  NEUTROABS 4.3  --   HGB 9.3* 9.5*  HCT 27.7* 28.4*  MCV  90.2 90.4  PLT 174 159   Cardiac Enzymes: No results for input(s): CKTOTAL, CKMB, CKMBINDEX, TROPONINI in the last 168 hours. BNP (last 3 results)  Recent Labs  03/15/15 1718 03/28/15 2200 04/17/15 2150  BNP 304.4* 415.5* 653.6*    ProBNP (last 3 results) No results for input(s): PROBNP in the last 8760 hours.  CBG: No results for input(s): GLUCAP in the last 168 hours.  No results found for this or any previous visit (from the past 240 hour(s)).   Studies: Dg Chest Port 1 View  04/17/2015   CLINICAL DATA:  Dyspnea, COPD, CHF  EXAM: PORTABLE CHEST - 1 VIEW  COMPARISON:  03/28/2015  FINDINGS: A single upright portable view of the chest demonstrates chronic left base opacity and volume loss which appears to be unchanged over the past several examinations. There also are numerous granulomatous-appearing calcified nodes in the mediastinum and hila. Emphysematous and fibrotic changes are present bilaterally. No superimposed acute infiltrate or CHF is evident.  IMPRESSION: Chronic left base opacity and volume loss. Granulomatous changes. Emphysematous and fibrotic changes. No acute findings are evident.   Electronically Signed   By: Andreas Newport M.D.   On: 04/17/2015 21:57    Scheduled Meds: . albuterol  2.5 mg Nebulization TID  . aspirin  81 mg Oral q1800  . azithromycin  250 mg Oral QHS  . carvedilol  25 mg Oral BID WC  . cholecalciferol  2,000 Units Oral Daily  . clopidogrel  75 mg Oral Daily  . furosemide  40 mg Oral Daily  . heparin  5,000 Units Subcutaneous 3 times per day  . isosorbide mononitrate  60 mg Oral Daily  . methylPREDNISolone (SOLU-MEDROL) injection  80 mg Intravenous Q12H  . pantoprazole  40 mg Oral BID AC  . potassium chloride  10 mEq Oral Daily  . rosuvastatin  10 mg Oral QHS  . sertraline  50 mg Oral QHS  . sodium chloride  3 mL Intravenous Q12H  . tamsulosin  0.4 mg Oral Daily  . tiotropium  18 mcg Inhalation Daily   Continuous Infusions:   Time  Spent: 25 min   Charlynne Cousins  [facility] Pager [phone]. If 7PM-7AM, please contact night-coverage at [email or web address], password [facility] 04/18/2015, 8:26 AM  LOS: 1 day

## 2015-04-18 NOTE — Progress Notes (Signed)
Admission note:  Arrival Method: Pt arrived on stretcher from ED Mental Orientation: Alert and oriented x 4 with some slight memory impairment and delayed responses Telemetry: N/A Assessment: Completed, see doc flowsheets Skin: Stage II pressure wounds to sacrum, abrasion to left side of face, arms, and mid upper back. Bruising to knees. Wound care consult placed. See flowsheets for full assessment IV: Right forearm. Clean, dry and intact. Saline locked Pain: Patient states no pain at this time Tubes: N/A Safety Measures: Bed in lowest position, non-slip socks placed, call light within reach Fall Prevention Safety Plan: Reviewed with patient Admission Screening: Completed  6700 Orientation: Patient has been oriented to the unit, staff and to the room. Patient lying comfortably in bed with caregiver at bedside. No further needs stated at this time. Call light within reach, will continue to monitor.  Shelbie Hutching, RN, BSN

## 2015-04-19 LAB — GLUCOSE, CAPILLARY
GLUCOSE-CAPILLARY: 224 mg/dL — AB (ref 65–99)
GLUCOSE-CAPILLARY: 271 mg/dL — AB (ref 65–99)
GLUCOSE-CAPILLARY: 266 mg/dL — AB (ref 65–99)
Glucose-Capillary: 220 mg/dL — ABNORMAL HIGH (ref 65–99)

## 2015-04-19 MED ORDER — PREDNISONE 50 MG PO TABS
50.0000 mg | ORAL_TABLET | Freq: Every day | ORAL | Status: DC
  Administered 2015-04-19 – 2015-04-20 (×2): 50 mg via ORAL
  Filled 2015-04-19 (×3): qty 1

## 2015-04-19 MED ORDER — INSULIN DETEMIR 100 UNIT/ML ~~LOC~~ SOLN
5.0000 [IU] | Freq: Every day | SUBCUTANEOUS | Status: DC
  Administered 2015-04-19 – 2015-04-20 (×2): 5 [IU] via SUBCUTANEOUS
  Filled 2015-04-19 (×2): qty 0.05

## 2015-04-19 MED ORDER — ALBUTEROL SULFATE (2.5 MG/3ML) 0.083% IN NEBU
2.5000 mg | INHALATION_SOLUTION | RESPIRATORY_TRACT | Status: DC | PRN
  Administered 2015-04-20: 2.5 mg via RESPIRATORY_TRACT
  Filled 2015-04-19: qty 3

## 2015-04-19 NOTE — Progress Notes (Signed)
[facility] PROGRESS NOTE Assessment/Plan:  Acute respiratory failure with hypoxia due to COPD exacerbation: - He relates his respiration is improved, change antibiotics and steroids to orals. - sat > 90 % on RA.  Essential hypertension: - Blood pressure well controlled continue current therapy.   Steroid hyperglycemia: - Start sliding scale insulin. - Start lantus  Code Status: full Family Communication: none  Disposition Plan: home in 2- days   Consultants:  none  Procedures:  CXR  Antibiotics:  azithro  HPI/Subjective: SOB is improved.  Objective: Filed Vitals:   04/18/15 2220 04/19/15 0418 04/19/15 0900 04/19/15 0921  BP:  150/58  93/40  Pulse:  54  68  Temp:  97.6 F (36.4 C)  97.8 F (36.6 C)  TempSrc:  Oral  Oral  Resp:  16  18  Weight:  61.7 kg (136 lb 0.4 oz)    SpO2: 98% 94% 96% 97%    Intake/Output Summary (Last 24 hours) at 04/19/15 0946 Last data filed at 04/19/15 0900  Gross per 24 hour  Intake   1080 ml  Output   1200 ml  Net   -120 ml   Filed Weights   04/18/15 0559 04/19/15 0418  Weight: 61.2 kg (134 lb 14.7 oz) 61.7 kg (136 lb 0.4 oz)    Exam:  General: Alert, awake, oriented x3, in no acute distress.  HEENT: No bruits, no goiter.  Heart: Regular rate and rhythm. Lungs: Good air movement,clear Abdomen: Soft, nontender, nondistended, positive bowel sounds.  Neuro: Grossly intact, nonfocal.   Data Reviewed: Basic Metabolic Panel:  Recent Labs Lab 04/17/15 2150 04/18/15 0400  NA 136 137  K 4.4 4.5  CL 105 105  CO2 21* 24  GLUCOSE 245* 303*  BUN 23* 24*  CREATININE 1.25* 1.18  CALCIUM 8.5* 8.6*   Liver Function Tests:  Recent Labs Lab 04/17/15 2150  AST 21  ALT 17  ALKPHOS 56  BILITOT 0.8  PROT 5.7*  ALBUMIN 3.1*   No results for input(s): LIPASE, AMYLASE in the last 168 hours. No results for input(s): AMMONIA in the last 168 hours. CBC:  Recent Labs Lab 04/17/15 2150 04/18/15 0400  WBC  5.0 4.1  NEUTROABS 4.3  --   HGB 9.3* 9.5*  HCT 27.7* 28.4*  MCV 90.2 90.4  PLT 174 159   Cardiac Enzymes: No results for input(s): CKTOTAL, CKMB, CKMBINDEX, TROPONINI in the last 168 hours. BNP (last 3 results)  Recent Labs  03/15/15 1718 03/28/15 2200 04/17/15 2150  BNP 304.4* 415.5* 653.6*    ProBNP (last 3 results) No results for input(s): PROBNP in the last 8760 hours.  CBG:  Recent Labs Lab 04/18/15 1125 04/18/15 1655 04/18/15 2133 04/19/15 0722  GLUCAP 303* 368* 227* 224*    No results found for this or any previous visit (from the past 240 hour(s)).   Studies: Dg Chest Port 1 View  04/17/2015   CLINICAL DATA:  Dyspnea, COPD, CHF  EXAM: PORTABLE CHEST - 1 VIEW  COMPARISON:  03/28/2015  FINDINGS: A single upright portable view of the chest demonstrates chronic left base opacity and volume loss which appears to be unchanged over the past several examinations. There also are numerous granulomatous-appearing calcified nodes in the mediastinum and hila. Emphysematous and fibrotic changes are present bilaterally. No superimposed acute infiltrate or CHF is evident.  IMPRESSION: Chronic left base opacity and volume loss. Granulomatous changes. Emphysematous and fibrotic changes. No acute findings are evident.   Electronically Signed   By:  Andreas Newport M.D.   On: 04/17/2015 21:57    Scheduled Meds: . albuterol  2.5 mg Nebulization TID  . aspirin  81 mg Oral q1800  . azithromycin  250 mg Oral QHS  . carvedilol  25 mg Oral BID WC  . cholecalciferol  2,000 Units Oral Daily  . clopidogrel  75 mg Oral Daily  . furosemide  40 mg Oral Daily  . heparin  5,000 Units Subcutaneous 3 times per day  . insulin aspart  0-5 Units Subcutaneous QHS  . insulin aspart  0-9 Units Subcutaneous TID WC  . insulin aspart  4 Units Subcutaneous TID WC  . isosorbide mononitrate  60 mg Oral Daily  . methylPREDNISolone (SOLU-MEDROL) injection  80 mg Intravenous Q12H  . pantoprazole  40 mg  Oral BID AC  . potassium chloride  10 mEq Oral Daily  . rosuvastatin  10 mg Oral QHS  . sertraline  50 mg Oral QHS  . sodium chloride  3 mL Intravenous Q12H  . tamsulosin  0.4 mg Oral Daily  . tiotropium  18 mcg Inhalation Daily   Continuous Infusions:   Time Spent: 25 min   [name]  [facility] Pager [phone]. If 7PM-7AM, please contact night-coverage at [email or web address], password [facility] 04/19/2015, 9:46 AM  LOS: 2 days

## 2015-04-19 NOTE — Consult Note (Signed)
WOC wound consult note Reason for Consult: pressure ulcers to the sacrum Pt reports sore areas on the buttocks, private caregiver in the room who is unfamiliar with the length of time the ulcers have been present. Wound type: Stage III Pressure ulcer; left buttock Stage II Pressure ulcers; over the sacrum and right buttock (2-3 areas) Pressure Ulcer POA: Yes Measurement: Stage III-1.0cm x 1.0cm x 0.4cm; stage II areas all aprox. 0.5cm x 0.5cm  Wound bed: all area clean, pink, moist Drainage (amount, consistency, odor) minimal  Periwound: intact, blanchable pink skin  Dressing procedure/placement/frequency: Add silver hydrofiber to the Stage III full thickness area, cover with silicone foam. Change every 3 days and PRN soilage.    Discussed POC with patient and bedside nurse.  Re consult if needed, will not follow at this time. Thanks  Rayla Pember [place], [place] [phone])

## 2015-04-20 ENCOUNTER — Encounter: Payer: Self-pay | Admitting: *Deleted

## 2015-04-20 DIAGNOSIS — N179 Acute kidney failure, unspecified: Secondary | ICD-10-CM

## 2015-04-20 LAB — GLUCOSE, CAPILLARY
Glucose-Capillary: 116 mg/dL — ABNORMAL HIGH (ref 65–99)
Glucose-Capillary: 140 mg/dL — ABNORMAL HIGH (ref 65–99)

## 2015-04-20 MED ORDER — AZITHROMYCIN 250 MG PO TABS: ORAL_TABLET | ORAL | Status: DC

## 2015-04-20 MED ORDER — PREDNISONE 10 MG PO TABS: ORAL_TABLET | ORAL | Status: DC

## 2015-04-20 NOTE — Care Management Note (Addendum)
Case Management Note  Patient Details  Name: [name] MRN: [id number] Date of Birth: August 08, 1929  Subjective/Objective:               CM following for progression and d/c planning.     Action/Plan: 04/20/2015 Met with pt and privately paid [place]. Pt will d/c with niece to an apartment where she will provide 16hr a day care and the aide will stay with the pt while the niece works. They have no DME needs at this time.  No HH orders at this time. Expected Discharge Date:       04/20/2015           Expected Discharge Plan:  [place]  In-House Referral:  NA  Discharge planning Services  CM Consult  Post Acute Care Choice:  NA Choice offered to:  NA  DME Arranged:  N/A DME Agency:  NA  HH Arranged:    [place] Agency:     Status of Service:     Medicare Important Message Given:  Yes Date Medicare IM Given:  04/20/15 Medicare IM give by:  Jasmine Pang RN MPH, case manager, [phone] Date Additional Medicare IM Given:    Additional Medicare Important Message give by:     If discussed at [place] of Stay Meetings, dates discussed:    Additional Comments:Pt has agreed to [facility] services.   Sachiko Methot, Rory Percy, RN 04/20/2015, 3:52 PM

## 2015-04-20 NOTE — Discharge Summary (Signed)
Physician Discharge Summary  [name] BWI:[id number] DOB: 1929-06-25 DOA: 04/17/2015  PCP:  Melinda Crutch, MD  Admit date: 04/17/2015 Discharge date: 04/20/2015  Time spent: 35  minutes  Recommendations for Outpatient Follow-up:  1. Follow up with PCP in 1 week monitor his respiration status  Discharge Diagnoses:  Active Problems:   Acute respiratory failure with hypoxia   Essential hypertension   COPD (chronic obstructive pulmonary disease)   COPD exacerbation   AKI (acute kidney injury)   Discharge Condition: stable  Diet recommendation: regular  Filed Weights   04/18/15 0559 04/19/15 0418  Weight: 61.2 kg (134 lb 14.7 oz) 61.7 kg (136 lb 0.4 oz)    History of present illness:  This is an 81 year old man with past medical history of COPD recently discharged from the hospital, also chronic histoplasmosis the present with increase shortness of breath, the caregiver at the assisted living facility noticed that he was short of breath with ambulation. He denies any fever, chills, nausea, vomiting so he decided to bring him to the ED.  Hospital Course:  Acute respiratory failure with hypoxia due to COPD exacerbation: - He was started on oxygen on admission, IV steroids, antibiotics and inhalers. - Within 48 hours his respiration was improved he was off oxygen transition to oral medications and he performed well the overnight so he was discharged on steroid taper and antibiotics.  Essential hypertension: - No changes were made to his medication.  Procedures:  Chest x-ray  Consultations:  None  Discharge Exam: Filed Vitals:   04/20/15 0525  BP: 155/68  Pulse: 54  Temp: 97.6 F (36.4 C)  Resp: 16    General: Alert and oriented 3 Cardiovascular: Regular rate and rhythm Respiratory: Good air movement clear to auscultation.  Discharge Instructions   Discharge Instructions    Diet - low sodium heart healthy    Complete by:  As directed      Increase  activity slowly    Complete by:  As directed           Current Discharge Medication List    START taking these medications   Details  azithromycin (ZITHROMAX) 250 MG tablet Take 1 tab daily Qty: 6 each, Refills: 0      CONTINUE these medications which have CHANGED   Details  predniSONE (DELTASONE) 10 MG tablet Takes 4 tablets for 1 days, then 3 tablets for 1 days, then 2 tablets for 1 days, then 1 tab for 1 days, and then stop. Qty: 10 tablet, Refills: 0      CONTINUE these medications which have NOT CHANGED   Details  acetaminophen (TYLENOL) 325 MG tablet Take 325 mg by mouth every 6 (six) hours as needed for mild pain or moderate pain.    aspirin 81 MG chewable tablet Chew 81 mg by mouth daily at 6 PM.    carvedilol (COREG) 25 MG tablet Take 1 tablet (25 mg total) by mouth 2 (two) times daily with a meal. Qty: 180 tablet, Refills: 0    cholecalciferol (VITAMIN D) 1000 UNITS tablet Take 2,000 Units by mouth daily.    clopidogrel (PLAVIX) 75 MG tablet TAKE 1 TABLET DAILY Qty: 90 tablet, Refills: 3    dexlansoprazole (DEXILANT) 60 MG capsule Take 60 mg by mouth daily.    furosemide (LASIX) 40 MG tablet Take 1 tablet (40 mg total) by mouth daily. Qty: 30 tablet, Refills: 0    ipratropium-albuterol (DUONEB) 0.5-2.5 (3) MG/3ML SOLN Take 3 mLs by nebulization every  6 (six) hours as needed. Qty: 360 mL, Refills: 0    isosorbide mononitrate (IMDUR) 60 MG 24 hr tablet TAKE 1 TABLET DAILY Qty: 90 tablet, Refills: 1    Omega-3 Fatty Acids (FISH OIL) 1200 MG CAPS Take 1,200 mg by mouth 2 (two) times daily. 9am and 5pm    potassium chloride (K-DUR) 10 MEQ tablet Take 1 tablet (10 mEq total) by mouth daily. Qty: 60 tablet, Refills: 0    rosuvastatin (CRESTOR) 10 MG tablet Take 10 mg by mouth at bedtime.    sertraline (ZOLOFT) 50 MG tablet Take 1 tablet (50 mg total) by mouth at bedtime. Qty: 30 tablet, Refills: 0    tamsulosin (FLOMAX) 0.4 MG CAPS capsule Take 1 capsule (0.4  mg total) by mouth daily. Qty: 30 capsule, Refills: 0    tiotropium (SPIRIVA HANDIHALER) 18 MCG inhalation capsule Place 1 capsule (18 mcg total) into inhaler and inhale daily. Qty: 30 capsule, Refills: 0    docusate sodium (COLACE) 100 MG capsule Take 1 capsule (100 mg total) by mouth 2 (two) times daily. Qty: 10 capsule, Refills: 0    mometasone-formoterol (DULERA) 100-5 MCG/ACT AERO Inhale 2 puffs into the lungs 2 (two) times daily. Qty: 13 g, Refills: 0    nitrofurantoin, macrocrystal-monohydrate, (MACROBID) 100 MG capsule Take 1 capsule (100 mg total) by mouth 2 (two) times daily. X 7 days Qty: 14 capsule, Refills: 0      STOP taking these medications     pantoprazole (PROTONIX) 40 MG tablet        Allergies  Allergen Reactions  . Pioglitazone Cough  . Ramipril Cough   Follow-up Information    Follow up with  Melinda Crutch, MD In 2 weeks.   Specialty:  Family Medicine   Why:  hospital follow up   Contact information:   [place] [address] [phone]        The results of significant diagnostics from this hospitalization (including imaging, microbiology, ancillary and laboratory) are listed below for reference.    Significant Diagnostic Studies: Dg Chest Port 1 View  04/17/2015   CLINICAL DATA:  Dyspnea, COPD, CHF  EXAM: PORTABLE CHEST - 1 VIEW  COMPARISON:  03/28/2015  FINDINGS: A single upright portable view of the chest demonstrates chronic left base opacity and volume loss which appears to be unchanged over the past several examinations. There also are numerous granulomatous-appearing calcified nodes in the mediastinum and hila. Emphysematous and fibrotic changes are present bilaterally. No superimposed acute infiltrate or CHF is evident.  IMPRESSION: Chronic left base opacity and volume loss. Granulomatous changes. Emphysematous and fibrotic changes. No acute findings are evident.   Electronically Signed   By: Andreas Newport M.D.   On:  04/17/2015 21:57   Dg Chest Port 1 View  03/28/2015   CLINICAL DATA:  Severe shortness of breath.  EXAM: PORTABLE CHEST - 1 VIEW  COMPARISON:  Frontal and lateral views 03/18/2015  FINDINGS: Heart at the upper limits of normal in size. Extensive calcified hilar and mediastinal lymph nodes, stable. Unchanged bilateral pleural effusions, left greater than right, small. Scarring noted in both lower lung zones. No confluent airspace disease, pulmonary edema, or pneumothorax. Hyperinflation and emphysema persists.  IMPRESSION: 1. Hyperinflation and emphysema with scarring, unchanged from prior exam. No definite superimposed acute process. 2. Unchanged small bilateral pleural effusions, left greater than right. 3. Extensive calcified hilar or mediastinal adenopathy, also unchanged.   Electronically Signed   By: Fonnie Birkenhead.D.  On: 03/28/2015 23:13    Microbiology: No results found for this or any previous visit (from the past 240 hour(s)).   Labs: Basic Metabolic Panel:  Recent Labs Lab 04/17/15 2150 04/18/15 0400  NA 136 137  K 4.4 4.5  CL 105 105  CO2 21* 24  GLUCOSE 245* 303*  BUN 23* 24*  CREATININE 1.25* 1.18  CALCIUM 8.5* 8.6*   Liver Function Tests:  Recent Labs Lab 04/17/15 2150  AST 21  ALT 17  ALKPHOS 56  BILITOT 0.8  PROT 5.7*  ALBUMIN 3.1*   No results for input(s): LIPASE, AMYLASE in the last 168 hours. No results for input(s): AMMONIA in the last 168 hours. CBC:  Recent Labs Lab 04/17/15 2150 04/18/15 0400  WBC 5.0 4.1  NEUTROABS 4.3  --   HGB 9.3* 9.5*  HCT 27.7* 28.4*  MCV 90.2 90.4  PLT 174 159   Cardiac Enzymes: No results for input(s): CKTOTAL, CKMB, CKMBINDEX, TROPONINI in the last 168 hours. BNP: BNP (last 3 results)  Recent Labs  03/15/15 1718 03/28/15 2200 04/17/15 2150  BNP 304.4* 415.5* 653.6*    ProBNP (last 3 results) No results for input(s): PROBNP in the last 8760 hours.  CBG:  Recent Labs Lab 04/19/15 0722  04/19/15 1137 04/19/15 1613 04/19/15 2140 04/20/15 0734  GLUCAP 224* 271* 266* 220* 116*       Signed:  Charlynne Cousins  [facility] 04/20/2015, 8:11 AM

## 2015-04-20 NOTE — Progress Notes (Signed)
Patient discharge teaching given, including activity, diet, follow-up appoints, and medications. Patient verbalized understanding of all discharge instructions. IV access was d/c'd. Vitals are stable. Skin is intact except as charted in most recent assessments. Pt to be escorted out by NT, to be driven home by family.  Contina Strain, MBA, BS, RN 

## 2015-04-20 NOTE — Consult Note (Signed)
   [facility] CM Inpatient Consult   04/20/2015  [name] Nov 16, 1928 [id number]   Patient evaluated for [place] Management services. Came to bedside to speak with patient at bedside to explain. He asked that Probation officer call his primary contact/ HCPOA/caregiver, [name] at [phone]. Called Anderson Malta to explain [facility] Care Management. She gave consent and written consent obtained. Mr Rho lives with Anderson Malta. His wife recently passed. He was recently at [facility] then ALF then home. Mr Ratz has caregivers and aides that are with him thru [employer]. He also lives with Ms [name] who has been a long time friend of Mr Plemons and his late wife. Confirmed his Primary Care MD is Dr. Harrington Challenger. Explained to [name] that she will receive post hospital transition of care calls and will be evaluated for monthly home visits. Confirmed best contact number as [phone]. Also confirmed address as [address], [place], [place] [address]. [name] is primary contact.  Will request for patient to be assigned.  Marthenia Rolling, MSN-Ed, RN,BSN [facility] Liaison [phone]

## 2015-04-22 ENCOUNTER — Encounter (HOSPITAL_COMMUNITY): Payer: Self-pay | Admitting: *Deleted

## 2015-04-22 ENCOUNTER — Emergency Department (HOSPITAL_COMMUNITY): Payer: Medicare Other

## 2015-04-22 ENCOUNTER — Other Ambulatory Visit: Payer: Self-pay | Admitting: *Deleted

## 2015-04-22 ENCOUNTER — Inpatient Hospital Stay (HOSPITAL_COMMUNITY)
Admission: EM | Admit: 2015-04-22 | Discharge: 2015-04-26 | DRG: 177 | Disposition: A | Discharge status: Home Health | Payer: Medicare Other | Attending: Internal Medicine | Admitting: Internal Medicine

## 2015-04-22 DIAGNOSIS — IMO0002 Reserved for concepts with insufficient information to code with codable children: Secondary | ICD-10-CM

## 2015-04-22 DIAGNOSIS — Z8546 Personal history of malignant neoplasm of prostate: Secondary | ICD-10-CM

## 2015-04-22 DIAGNOSIS — J441 Chronic obstructive pulmonary disease with (acute) exacerbation: Secondary | ICD-10-CM

## 2015-04-22 DIAGNOSIS — E1165 Type 2 diabetes mellitus with hyperglycemia: Secondary | ICD-10-CM

## 2015-04-22 DIAGNOSIS — I2510 Atherosclerotic heart disease of native coronary artery without angina pectoris: Secondary | ICD-10-CM | POA: Diagnosis present

## 2015-04-22 DIAGNOSIS — E785 Hyperlipidemia, unspecified: Secondary | ICD-10-CM | POA: Diagnosis present

## 2015-04-22 DIAGNOSIS — J189 Pneumonia, unspecified organism: Secondary | ICD-10-CM

## 2015-04-22 DIAGNOSIS — R05 Cough: Secondary | ICD-10-CM | POA: Diagnosis not present

## 2015-04-22 DIAGNOSIS — E119 Type 2 diabetes mellitus without complications: Secondary | ICD-10-CM | POA: Diagnosis present

## 2015-04-22 DIAGNOSIS — I10 Essential (primary) hypertension: Secondary | ICD-10-CM | POA: Diagnosis present

## 2015-04-22 DIAGNOSIS — Z791 Long term (current) use of non-steroidal anti-inflammatories (NSAID): Secondary | ICD-10-CM

## 2015-04-22 DIAGNOSIS — R059 Cough, unspecified: Secondary | ICD-10-CM

## 2015-04-22 DIAGNOSIS — Z66 Do not resuscitate: Secondary | ICD-10-CM | POA: Diagnosis present

## 2015-04-22 DIAGNOSIS — Z7902 Long term (current) use of antithrombotics/antiplatelets: Secondary | ICD-10-CM

## 2015-04-22 DIAGNOSIS — I129 Hypertensive chronic kidney disease with stage 1 through stage 4 chronic kidney disease, or unspecified chronic kidney disease: Secondary | ICD-10-CM | POA: Diagnosis present

## 2015-04-22 DIAGNOSIS — Z888 Allergy status to other drugs, medicaments and biological substances status: Secondary | ICD-10-CM

## 2015-04-22 DIAGNOSIS — J690 Pneumonitis due to inhalation of food and vomit: Principal | ICD-10-CM | POA: Diagnosis present

## 2015-04-22 DIAGNOSIS — I252 Old myocardial infarction: Secondary | ICD-10-CM

## 2015-04-22 DIAGNOSIS — I5032 Chronic diastolic (congestive) heart failure: Secondary | ICD-10-CM | POA: Diagnosis present

## 2015-04-22 DIAGNOSIS — J9601 Acute respiratory failure with hypoxia: Secondary | ICD-10-CM | POA: Diagnosis present

## 2015-04-22 DIAGNOSIS — Z825 Family history of asthma and other chronic lower respiratory diseases: Secondary | ICD-10-CM

## 2015-04-22 DIAGNOSIS — J449 Chronic obstructive pulmonary disease, unspecified: Secondary | ICD-10-CM | POA: Diagnosis present

## 2015-04-22 DIAGNOSIS — I255 Ischemic cardiomyopathy: Secondary | ICD-10-CM | POA: Diagnosis present

## 2015-04-22 DIAGNOSIS — K219 Gastro-esophageal reflux disease without esophagitis: Secondary | ICD-10-CM | POA: Diagnosis present

## 2015-04-22 DIAGNOSIS — Z7982 Long term (current) use of aspirin: Secondary | ICD-10-CM

## 2015-04-22 DIAGNOSIS — R0602 Shortness of breath: Secondary | ICD-10-CM

## 2015-04-22 DIAGNOSIS — Z79899 Other long term (current) drug therapy: Secondary | ICD-10-CM

## 2015-04-22 DIAGNOSIS — I5033 Acute on chronic diastolic (congestive) heart failure: Secondary | ICD-10-CM | POA: Diagnosis present

## 2015-04-22 DIAGNOSIS — N182 Chronic kidney disease, stage 2 (mild): Secondary | ICD-10-CM | POA: Diagnosis present

## 2015-04-22 DIAGNOSIS — Z85828 Personal history of other malignant neoplasm of skin: Secondary | ICD-10-CM

## 2015-04-22 DIAGNOSIS — B399 Histoplasmosis, unspecified: Secondary | ICD-10-CM | POA: Diagnosis present
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
## 2015-04-22 NOTE — Patient Outreach (Signed)
[facility]) Care Management  04/22/2015  [name] [birth year] [id number]   Assessment: Transition of care completed. Spoke to [employer], authorize contact person on the consent form. Per Ms Baxter Flattery, the only contact number is [phone] which she is carrying. Member was at a different location (at home). She reports she was on her way to see member later to check on him. Ms. Baxter Flattery reports member saw his orthopedic doctor ( Dr. Veverly Fells of [facility]) yesterday and cortisone shot was given. She further states that she had set-up physical therapy with [facility] to see member 2 times a week and has a private pay aide that provides care for member while she is at work. She reported that member has a reopened sore on his "tail bone", that is being treated by [facility] care staff. Ms. Baxter Flattery also reported that she is assisting member in  preparing his medications. Scheduled an appointment to see member tomorrow at his home.  Plan: Initial home visit with member tomorrow.  Trisha Ken A. Thurmon Mizell, BSN, RN-BC [place] Coordinator Cell: [phone]

## 2015-04-22 NOTE — ED Notes (Signed)
Pt arrives via EMS from home c/o SOB x 2 days. Pt was dc'd from hospital on Monday. Upon EMS arrival pt was SOB, tachypneic. Wet prod cough. Hx COPD. CBG 424. 92% on RA, placed on CPAP and given 5 albuterol.

## 2015-04-23 ENCOUNTER — Encounter: Payer: Self-pay | Admitting: *Deleted

## 2015-04-23 ENCOUNTER — Encounter (HOSPITAL_COMMUNITY): Payer: Self-pay | Admitting: Radiology

## 2015-04-23 ENCOUNTER — Emergency Department (HOSPITAL_COMMUNITY): Payer: Medicare Other

## 2015-04-23 ENCOUNTER — Ambulatory Visit: Payer: Medicare Other | Admitting: *Deleted

## 2015-04-23 DIAGNOSIS — R05 Cough: Secondary | ICD-10-CM | POA: Diagnosis present

## 2015-04-23 DIAGNOSIS — J9601 Acute respiratory failure with hypoxia: Secondary | ICD-10-CM | POA: Diagnosis not present

## 2015-04-23 DIAGNOSIS — I252 Old myocardial infarction: Secondary | ICD-10-CM | POA: Diagnosis not present

## 2015-04-23 DIAGNOSIS — Z8546 Personal history of malignant neoplasm of prostate: Secondary | ICD-10-CM | POA: Diagnosis not present

## 2015-04-23 DIAGNOSIS — Z791 Long term (current) use of non-steroidal anti-inflammatories (NSAID): Secondary | ICD-10-CM | POA: Diagnosis not present

## 2015-04-23 DIAGNOSIS — Z7902 Long term (current) use of antithrombotics/antiplatelets: Secondary | ICD-10-CM | POA: Diagnosis not present

## 2015-04-23 DIAGNOSIS — Z85828 Personal history of other malignant neoplasm of skin: Secondary | ICD-10-CM | POA: Diagnosis not present

## 2015-04-23 DIAGNOSIS — I255 Ischemic cardiomyopathy: Secondary | ICD-10-CM | POA: Diagnosis present

## 2015-04-23 DIAGNOSIS — I5033 Acute on chronic diastolic (congestive) heart failure: Secondary | ICD-10-CM | POA: Diagnosis present

## 2015-04-23 DIAGNOSIS — K219 Gastro-esophageal reflux disease without esophagitis: Secondary | ICD-10-CM | POA: Diagnosis present

## 2015-04-23 DIAGNOSIS — J438 Other emphysema: Secondary | ICD-10-CM | POA: Diagnosis not present

## 2015-04-23 DIAGNOSIS — Z66 Do not resuscitate: Secondary | ICD-10-CM | POA: Diagnosis present

## 2015-04-23 DIAGNOSIS — Z79899 Other long term (current) drug therapy: Secondary | ICD-10-CM | POA: Diagnosis not present

## 2015-04-23 DIAGNOSIS — E785 Hyperlipidemia, unspecified: Secondary | ICD-10-CM | POA: Diagnosis present

## 2015-04-23 DIAGNOSIS — J441 Chronic obstructive pulmonary disease with (acute) exacerbation: Secondary | ICD-10-CM | POA: Diagnosis not present

## 2015-04-23 DIAGNOSIS — Z888 Allergy status to other drugs, medicaments and biological substances status: Secondary | ICD-10-CM | POA: Diagnosis not present

## 2015-04-23 DIAGNOSIS — I2510 Atherosclerotic heart disease of native coronary artery without angina pectoris: Secondary | ICD-10-CM | POA: Diagnosis present

## 2015-04-23 DIAGNOSIS — Z825 Family history of asthma and other chronic lower respiratory diseases: Secondary | ICD-10-CM | POA: Diagnosis not present

## 2015-04-23 DIAGNOSIS — N182 Chronic kidney disease, stage 2 (mild): Secondary | ICD-10-CM | POA: Diagnosis not present

## 2015-04-23 DIAGNOSIS — I5032 Chronic diastolic (congestive) heart failure: Secondary | ICD-10-CM | POA: Diagnosis not present

## 2015-04-23 DIAGNOSIS — B399 Histoplasmosis, unspecified: Secondary | ICD-10-CM | POA: Diagnosis present

## 2015-04-23 DIAGNOSIS — I129 Hypertensive chronic kidney disease with stage 1 through stage 4 chronic kidney disease, or unspecified chronic kidney disease: Secondary | ICD-10-CM | POA: Diagnosis present

## 2015-04-23 DIAGNOSIS — Z7982 Long term (current) use of aspirin: Secondary | ICD-10-CM | POA: Diagnosis not present

## 2015-04-23 DIAGNOSIS — J690 Pneumonitis due to inhalation of food and vomit: Secondary | ICD-10-CM | POA: Diagnosis present

## 2015-04-23 DIAGNOSIS — E119 Type 2 diabetes mellitus without complications: Secondary | ICD-10-CM | POA: Diagnosis present

## 2015-04-23 LAB — CBC
HEMATOCRIT: 30.2 % — AB (ref 39.0–52.0)
Hemoglobin: 10.2 g/dL — ABNORMAL LOW (ref 13.0–17.0)
MCH: 30.2 pg (ref 26.0–34.0)
MCHC: 33.8 g/dL (ref 30.0–36.0)
MCV: 89.3 fL (ref 78.0–100.0)
Platelets: 206 10*3/uL (ref 150–400)
RBC: 3.38 MIL/uL — ABNORMAL LOW (ref 4.22–5.81)
RDW: 15.1 % (ref 11.5–15.5)
WBC: 5.7 10*3/uL (ref 4.0–10.5)

## 2015-04-23 LAB — COMPREHENSIVE METABOLIC PANEL
ALBUMIN: 3.2 g/dL — AB (ref 3.5–5.0)
ALK PHOS: 49 U/L (ref 38–126)
ALT: 13 U/L — ABNORMAL LOW (ref 17–63)
AST: 14 U/L — AB (ref 15–41)
Anion gap: 10 (ref 5–15)
BUN: 39 mg/dL — AB (ref 6–20)
CHLORIDE: 106 mmol/L (ref 101–111)
CO2: 26 mmol/L (ref 22–32)
CREATININE: 1.23 mg/dL (ref 0.61–1.24)
Calcium: 9.2 mg/dL (ref 8.9–10.3)
GFR calc Af Amer: >60 mL/min (ref >60)
GFR calc non Af Amer: 52 mL/min — ABNORMAL LOW (ref >60)
Glucose, Bld: 223 mg/dL — ABNORMAL HIGH (ref 65–99)
POTASSIUM: 3.9 mmol/L (ref 3.5–5.1)
Sodium: 142 mmol/L (ref 135–145)
TOTAL PROTEIN: 6.1 g/dL — AB (ref 6.5–8.1)
Total Bilirubin: 1 mg/dL (ref 0.3–1.2)

## 2015-04-23 LAB — CBC WITH DIFFERENTIAL/PLATELET
BASOS PCT: 0 % (ref 0–1)
Basophils Absolute: 0 10*3/uL (ref 0.0–0.1)
EOS ABS: 0 10*3/uL (ref 0.0–0.7)
Eosinophils Relative: 0 % (ref 0–5)
HCT: 30.8 % — ABNORMAL LOW (ref 39.0–52.0)
Hemoglobin: 10.5 g/dL — ABNORMAL LOW (ref 13.0–17.0)
LYMPHS ABS: 0.4 10*3/uL — AB (ref 0.7–4.0)
Lymphocytes Relative: 9 % — ABNORMAL LOW (ref 12–46)
MCH: 30.3 pg (ref 26.0–34.0)
MCHC: 34.1 g/dL (ref 30.0–36.0)
MCV: 89 fL (ref 78.0–100.0)
Monocytes Absolute: 0.3 10*3/uL (ref 0.1–1.0)
Monocytes Relative: 6 % (ref 3–12)
Neutro Abs: 4.3 10*3/uL (ref 1.7–7.7)
Neutrophils Relative %: 85 % — ABNORMAL HIGH (ref 43–77)
PLATELETS: 203 10*3/uL (ref 150–400)
RBC: 3.46 MIL/uL — ABNORMAL LOW (ref 4.22–5.81)
RDW: 15.2 % (ref 11.5–15.5)
WBC: 5 10*3/uL (ref 4.0–10.5)

## 2015-04-23 LAB — BASIC METABOLIC PANEL
ANION GAP: 9 (ref 5–15)
BUN: 45 mg/dL — ABNORMAL HIGH (ref 6–20)
CHLORIDE: 104 mmol/L (ref 101–111)
CO2: 26 mmol/L (ref 22–32)
CREATININE: 1.37 mg/dL — AB (ref 0.61–1.24)
Calcium: 9 mg/dL (ref 8.9–10.3)
GFR calc Af Amer: 53 mL/min — ABNORMAL LOW (ref >60)
GFR calc non Af Amer: 46 mL/min — ABNORMAL LOW (ref >60)
GLUCOSE: 378 mg/dL — AB (ref 65–99)
Potassium: 4.4 mmol/L (ref 3.5–5.1)
Sodium: 139 mmol/L (ref 135–145)

## 2015-04-23 LAB — I-STAT TROPONIN, ED: Troponin i, poc: 0.02 ng/mL (ref 0.00–0.08)

## 2015-04-23 LAB — GLUCOSE, CAPILLARY
GLUCOSE-CAPILLARY: 214 mg/dL — AB (ref 65–99)
Glucose-Capillary: 159 mg/dL — ABNORMAL HIGH (ref 65–99)
Glucose-Capillary: 197 mg/dL — ABNORMAL HIGH (ref 65–99)
Glucose-Capillary: 273 mg/dL — ABNORMAL HIGH (ref 65–99)

## 2015-04-23 LAB — BRAIN NATRIURETIC PEPTIDE: B NATRIURETIC PEPTIDE 5: 464.5 pg/mL — AB (ref 0.0–100.0)

## 2015-04-23 LAB — I-STAT CG4 LACTIC ACID, ED: LACTIC ACID, VENOUS: 1.91 mmol/L (ref 0.5–2.0)

## 2015-04-23 MED ORDER — VANCOMYCIN HCL IN DEXTROSE 1-5 GM/200ML-% IV SOLN
1000.0000 mg | INTRAVENOUS | Status: DC
  Administered 2015-04-24 – 2015-04-26 (×3): 1000 mg via INTRAVENOUS
  Filled 2015-04-23 (×3): qty 200

## 2015-04-23 MED ORDER — OMEGA-3-ACID ETHYL ESTERS 1 G PO CAPS
1000.0000 mg | ORAL_CAPSULE | Freq: Two times a day (BID) | ORAL | Status: DC
  Administered 2015-04-23 – 2015-04-26 (×7): 1000 mg via ORAL
  Filled 2015-04-23 (×8): qty 1

## 2015-04-23 MED ORDER — ACETAMINOPHEN 325 MG PO TABS: 650.0000 mg | ORAL_TABLET | Freq: Four times a day (QID) | ORAL | Status: DC | PRN

## 2015-04-23 MED ORDER — CARVEDILOL 25 MG PO TABS
25.0000 mg | ORAL_TABLET | Freq: Two times a day (BID) | ORAL | Status: DC
  Administered 2015-04-23 – 2015-04-24 (×4): 25 mg via ORAL
  Filled 2015-04-23 (×5): qty 1

## 2015-04-23 MED ORDER — PANTOPRAZOLE SODIUM 40 MG PO TBEC
40.0000 mg | DELAYED_RELEASE_TABLET | Freq: Every day | ORAL | Status: DC
  Administered 2015-04-23 – 2015-04-25 (×3): 40 mg via ORAL
  Filled 2015-04-23 (×3): qty 1

## 2015-04-23 MED ORDER — ALBUTEROL SULFATE (2.5 MG/3ML) 0.083% IN NEBU
2.5000 mg | INHALATION_SOLUTION | Freq: Four times a day (QID) | RESPIRATORY_TRACT | Status: DC
Start: 2015-04-23 — End: 2015-04-24
  Administered 2015-04-23 – 2015-04-24 (×4): 2.5 mg via RESPIRATORY_TRACT
  Filled 2015-04-23 (×5): qty 3

## 2015-04-23 MED ORDER — VANCOMYCIN HCL IN DEXTROSE 1-5 GM/200ML-% IV SOLN: 1000.0000 mg | INTRAVENOUS | Status: DC

## 2015-04-23 MED ORDER — ALBUTEROL SULFATE (2.5 MG/3ML) 0.083% IN NEBU
2.5000 mg | INHALATION_SOLUTION | RESPIRATORY_TRACT | Status: DC
Start: 2015-04-23 — End: 2015-04-23

## 2015-04-23 MED ORDER — TAMSULOSIN HCL 0.4 MG PO CAPS
0.4000 mg | ORAL_CAPSULE | Freq: Every day | ORAL | Status: DC
  Administered 2015-04-23 – 2015-04-26 (×4): 0.4 mg via ORAL
  Filled 2015-04-23 (×4): qty 1

## 2015-04-23 MED ORDER — IPRATROPIUM BROMIDE 0.02 % IN SOLN
0.5000 mg | Freq: Four times a day (QID) | RESPIRATORY_TRACT | Status: DC
  Administered 2015-04-23 – 2015-04-24 (×4): 0.5 mg via RESPIRATORY_TRACT
  Filled 2015-04-23 (×5): qty 2.5

## 2015-04-23 MED ORDER — VANCOMYCIN HCL IN DEXTROSE 1-5 GM/200ML-% IV SOLN
1000.0000 mg | INTRAVENOUS | Status: AC
  Administered 2015-04-23: 1000 mg via INTRAVENOUS
  Filled 2015-04-23: qty 200

## 2015-04-23 MED ORDER — DOCUSATE SODIUM 100 MG PO CAPS
100.0000 mg | ORAL_CAPSULE | Freq: Two times a day (BID) | ORAL | Status: DC
  Administered 2015-04-23 – 2015-04-26 (×7): 100 mg via ORAL
  Filled 2015-04-23 (×8): qty 1

## 2015-04-23 MED ORDER — BUDESONIDE 0.25 MG/2ML IN SUSP
0.2500 mg | Freq: Two times a day (BID) | RESPIRATORY_TRACT | Status: DC
  Administered 2015-04-23 – 2015-04-26 (×6): 0.25 mg via RESPIRATORY_TRACT
  Filled 2015-04-23 (×10): qty 2

## 2015-04-23 MED ORDER — CLOPIDOGREL BISULFATE 75 MG PO TABS
75.0000 mg | ORAL_TABLET | Freq: Every day | ORAL | Status: DC
  Administered 2015-04-23 – 2015-04-26 (×4): 75 mg via ORAL
  Filled 2015-04-23 (×4): qty 1

## 2015-04-23 MED ORDER — PIPERACILLIN-TAZOBACTAM 3.375 G IVPB 30 MIN
3.3750 g | Freq: Three times a day (TID) | INTRAVENOUS | Status: DC
  Filled 2015-04-23 (×2): qty 50

## 2015-04-23 MED ORDER — ENOXAPARIN SODIUM 40 MG/0.4ML ~~LOC~~ SOLN
40.0000 mg | Freq: Every day | SUBCUTANEOUS | Status: DC
  Administered 2015-04-23 – 2015-04-26 (×4): 40 mg via SUBCUTANEOUS
  Filled 2015-04-23 (×4): qty 0.4

## 2015-04-23 MED ORDER — POTASSIUM CHLORIDE ER 10 MEQ PO TBCR
10.0000 meq | EXTENDED_RELEASE_TABLET | Freq: Every day | ORAL | Status: DC
  Administered 2015-04-23 – 2015-04-25 (×3): 10 meq via ORAL
  Filled 2015-04-23 (×4): qty 1

## 2015-04-23 MED ORDER — INSULIN ASPART 100 UNIT/ML ~~LOC~~ SOLN
3.0000 [IU] | Freq: Once | SUBCUTANEOUS | Status: AC
  Administered 2015-04-24: 3 [IU] via SUBCUTANEOUS

## 2015-04-23 MED ORDER — SERTRALINE HCL 50 MG PO TABS
50.0000 mg | ORAL_TABLET | Freq: Every day | ORAL | Status: DC
  Administered 2015-04-23 – 2015-04-25 (×3): 50 mg via ORAL
  Filled 2015-04-23 (×4): qty 1

## 2015-04-23 MED ORDER — ISOSORBIDE MONONITRATE ER 60 MG PO TB24
60.0000 mg | ORAL_TABLET | Freq: Every day | ORAL | Status: DC
  Administered 2015-04-23 – 2015-04-25 (×3): 60 mg via ORAL
  Filled 2015-04-23 (×3): qty 1

## 2015-04-23 MED ORDER — ONDANSETRON HCL 4 MG PO TABS: 4.0000 mg | ORAL_TABLET | Freq: Four times a day (QID) | ORAL | Status: DC | PRN

## 2015-04-23 MED ORDER — ROSUVASTATIN CALCIUM 10 MG PO TABS
10.0000 mg | ORAL_TABLET | Freq: Every day | ORAL | Status: DC
  Administered 2015-04-23 – 2015-04-25 (×3): 10 mg via ORAL
  Filled 2015-04-23 (×4): qty 1

## 2015-04-23 MED ORDER — INSULIN ASPART 100 UNIT/ML ~~LOC~~ SOLN
0.0000 [IU] | Freq: Three times a day (TID) | SUBCUTANEOUS | Status: DC
  Administered 2015-04-23 (×2): 2 [IU] via SUBCUTANEOUS
  Administered 2015-04-23: 3 [IU] via SUBCUTANEOUS
  Administered 2015-04-24: 7 [IU] via SUBCUTANEOUS
  Administered 2015-04-24: 5 [IU] via SUBCUTANEOUS

## 2015-04-23 MED ORDER — ONDANSETRON HCL 4 MG/2ML IJ SOLN: 4.0000 mg | Freq: Four times a day (QID) | INTRAMUSCULAR | Status: DC | PRN

## 2015-04-23 MED ORDER — ASPIRIN 81 MG PO CHEW
81.0000 mg | CHEWABLE_TABLET | Freq: Every day | ORAL | Status: DC
  Administered 2015-04-23 – 2015-04-25 (×3): 81 mg via ORAL
  Filled 2015-04-23 (×3): qty 1

## 2015-04-23 MED ORDER — PREDNISONE 20 MG PO TABS
40.0000 mg | ORAL_TABLET | Freq: Every day | ORAL | Status: DC
  Administered 2015-04-23 – 2015-04-24 (×2): 40 mg via ORAL
  Filled 2015-04-23 (×3): qty 2

## 2015-04-23 MED ORDER — ACETAMINOPHEN 650 MG RE SUPP: 650.0000 mg | Freq: Four times a day (QID) | RECTAL | Status: DC | PRN

## 2015-04-23 MED ORDER — FUROSEMIDE 40 MG PO TABS
40.0000 mg | ORAL_TABLET | Freq: Every day | ORAL | Status: DC
  Administered 2015-04-23 – 2015-04-25 (×3): 40 mg via ORAL
  Filled 2015-04-23 (×4): qty 1

## 2015-04-23 MED ORDER — ALBUTEROL SULFATE (2.5 MG/3ML) 0.083% IN NEBU: 2.5000 mg | INHALATION_SOLUTION | RESPIRATORY_TRACT | Status: DC | PRN

## 2015-04-23 MED ORDER — PIPERACILLIN-TAZOBACTAM 3.375 G IVPB
3.3750 g | Freq: Three times a day (TID) | INTRAVENOUS | Status: DC
  Administered 2015-04-23 – 2015-04-26 (×10): 3.375 g via INTRAVENOUS
  Filled 2015-04-23 (×12): qty 50
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
## 2015-04-23 NOTE — Consult Note (Signed)
   [facility] Inpatient Consult   04/23/2015  THEO KRUMHOLZ 1929/03/31 [id number] Patient is recently active with [facility] for chronic disease management services at last admission last week.  Patient has been engaged by a [employer]. A home visit was planned for this afternoon.  Our community will base his plan of care has focused on disease management and community resource support. Made community nurse aware of patient's hospitalization today.   Met with the patient at bedside and he consents to post hospital follow up.  Patient will receive a post discharge transition of care call and will be evaluated for monthly home visits for assessments and disease process education.  Made Inpatient Case Manager aware that [facility] following. Of note, [facility] Care Management services does not replace or interfere with any services that are arranged by inpatient case management or social work.  For additional questions or referrals please contact: Natividad Brood, RN BSN [facility] Liaison  [phone] business mobile phone

## 2015-04-23 NOTE — ED Notes (Signed)
pts watch given to family.

## 2015-04-23 NOTE — Patient Outreach (Signed)
[facility]) Care Management  04/23/2015  [name] 05-Jul-1929 [id number]   Home visit cancelled today. Patient was admitted in the hospital.   .Edwena Felty A. Hannia Matchett, BSN, RN-BC [place] Coordinator Cell: [phone]
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
## 2015-04-23 NOTE — Progress Notes (Signed)
[facility]                                                                              Patient Demographics  Johan Creveling, is a 81 y.o. male, DOB - [birth year], QMV:[id number]  Admit date - 04/22/2015   Admitting Physician Rise Patience, MD  Outpatient Primary MD for the patient is  Melinda Crutch, MD  LOS - 0   Chief Complaint  Patient presents with  . Shortness of Breath       Brief HPI   Patient is a 81 year old male with COPD, CAD, chronic diastolic CHF, dyslipidemia, hypertension, GERD who was recently admitted to the hospital for COPD and discharged 3 days ago was brought to the ER to patient's healthcare power of attorney with whom patient lives found patient getting short of breath acutely. As per patient's healthcare power of attorney patient has been having increasing cough mostly in the nighttime. Patient did not have any chest pain nausea vomiting or abdominal pain. Since patient was getting very short of breath he was was called and brought to the ER. Initially patient was placed on C Pap. Following which patient's symptoms improved and on exam patient has coarse breath sounds with patient having productive cough. Chest x-ray shows some pleural thickening. Patient does have chronic lung disease and chronic histoplasmosis. Patient has been admitted for further management.     Assessment & Plan    Principal Problem:   Acute respiratory failure with hypoxia possibly due to aspiration, underlying COPD - Possibly due to aspiration pneumonia, patient noted to be coughing during breakfast - CT chest reviewed, chronic multiple calcified hilar and mediastinal lymph nodes, perihilar and peribronchial infiltration, likely inflammatory process fibrosing mediastinitis versus sarcoidosis, can't exclude super imposed pneumonia in the lung bases  - Obtain swallow evaluation today, continue IV vancomycin and Zosyn, add prednisone - Continue scheduled nebs, O2    Active Problems:   Diabetes mellitus, type 2 - Continue sliding scale insulin, may need to add meal coverage or basal insulin due to steroid-induced, Will follow CBGs    Essential hypertension - Currently stable, Coreg   continue   CKD (chronic kidney disease), stage II - Currently stable, follow creatinine function, on Lasix     Chronic diastolic CHF (congestive heart failure) - Continue Lasix, strict I's and O's and daily weights,  - 2-D echo in 5/16 showed EF of 60-65% with grade 1 diastolic dysfunction   Code Status:  DO NOT RESUSCITATE  Family Communication: Discussed in detail with the patient, all imaging results, lab results explained to the patient and care giver   Disposition Plan:   not medically ready   Time Spent in minutes  25 minutes  Procedures  CT chest  Consults   None   DVT Prophylaxis  Lovenox  Medications  Scheduled Meds: . albuterol  2.5 mg Nebulization Q6H  . aspirin  81 mg Oral q1800  . budesonide (PULMICORT) nebulizer solution  0.25 mg Nebulization BID  . carvedilol  25 mg Oral BID WC  . clopidogrel  75 mg Oral Daily  . docusate sodium  100 mg Oral BID  .  enoxaparin (LOVENOX) injection  40 mg Subcutaneous Daily  . furosemide  40 mg Oral Daily  . insulin aspart  0-9 Units Subcutaneous TID WC  . ipratropium  0.5 mg Nebulization Q6H  . isosorbide mononitrate  60 mg Oral Daily  . omega-3 acid ethyl esters  1,000 mg Oral BID  . pantoprazole  40 mg Oral Daily  . piperacillin-tazobactam (ZOSYN)  IV  3.375 g Intravenous 3 times per day  . potassium chloride  10 mEq Oral Daily  . rosuvastatin  10 mg Oral QHS  . sertraline  50 mg Oral QHS  . tamsulosin  0.4 mg Oral Daily  . [START ON 04/24/2015] vancomycin  1,000 mg Intravenous Q24H   Continuous Infusions:  PRN Meds:.acetaminophen **OR** acetaminophen, albuterol, ondansetron **OR** ondansetron (ZOFRAN) IV   Antibiotics   Anti-infectives    Start     Dose/Rate Route Frequency Ordered Stop    04/24/15 0500  vancomycin (VANCOCIN) IVPB 1000 mg/200 mL premix  Status:  Discontinued     1,000 mg 200 mL/hr over 60 Minutes Intravenous Every 24 hours 04/23/15 0300 04/23/15 0301   04/24/15 0500  vancomycin (VANCOCIN) IVPB 1000 mg/200 mL premix     1,000 mg 200 mL/hr over 60 Minutes Intravenous Every 24 hours 04/23/15 0301 05/01/15 0459   04/23/15 0600  piperacillin-tazobactam (ZOSYN) IVPB 3.375 g  Status:  Discontinued     3.375 g 100 mL/hr over 30 Minutes Intravenous 3 times per day 04/23/15 0201 04/23/15 0427   04/23/15 0500  piperacillin-tazobactam (ZOSYN) IVPB 3.375 g     3.375 g 12.5 mL/hr over 240 Minutes Intravenous 3 times per day 04/23/15 0427     04/23/15 0230  vancomycin (VANCOCIN) IVPB 1000 mg/200 mL premix     1,000 mg 200 mL/hr over 60 Minutes Intravenous STAT 04/23/15 0216 04/23/15 0744        Subjective:   Kyrillos Adams was seen and examined today. Afebrile but coughing, shortness of breath. Very deconditioned.  Patient denies dizziness,  abdominal pain, N/V/D/C, new weakness, numbess, tingling. No acute events overnight.    Objective:   Blood pressure 131/53, pulse 62, temperature 97.7 F (36.5 C), temperature source Oral, resp. rate 22, height 5\' 5"  (1.651 m), weight 59.013 kg (130 lb 1.6 oz), SpO2 98 %.  Wt Readings from Last 3 Encounters:  04/23/15 59.013 kg (130 lb 1.6 oz)  04/19/15 61.7 kg (136 lb 0.4 oz)  03/30/15 61.462 kg (135 lb 8 oz)     Intake/Output Summary (Last 24 hours) at 04/23/15 1216 Last data filed at 04/23/15 0542  Gross per 24 hour  Intake    350 ml  Output      0 ml  Net    350 ml    Exam  General: Alert and oriented x 3, NAD  HEENT:  PERRLA, EOMI, Anicteric Sclera, mucous membranes moist.   Neck: Supple, no JVD, no masses  CVS: S1 S2 auscultated, no rubs, murmurs or gallops. Regular rate and rhythm.  Respiratory: Mild scattered rhonchi, decreased breath sounds at the bases   Abdomen: Soft, nontender, nondistended,  + bowel sounds  Ext: no cyanosis clubbing or edema  Neuro: AAOx3, Cr N's II- XII. Strength 5/5 upper and lower extremities bilaterally  Skin: No rashes  Psych: Normal affect and demeanor, alert and oriented x3    Data Review   Micro Results No results found for this or any previous visit (from the past 240 hour(s)).  Radiology Reports Ct Chest Wo Contrast  04/23/2015   CLINICAL DATA:  Shortness of breath for 2 days. Discharge from hospital on Monday. Cough and tachypneic.  EXAM: CT CHEST WITHOUT CONTRAST  TECHNIQUE: Multidetector CT imaging of the chest was performed following the standard protocol without IV contrast.  COMPARISON:  06/04/2007  FINDINGS: Mild cardiac enlargement. Small pericardial effusion. Coronary artery and aortic valve calcifications. Normal caliber thoracic aorta with calcification. Extensive multiple calcified lymph nodes throughout the mediastinum and in the hila bilaterally. This likely represents postinflammatory change. No change since previous study. Esophagus is decompressed. Small esophageal hiatal hernia.  Prominent emphysematous changes in the lungs with bilateral apical bulla. Perihilar and perivascular infiltration or scarring similar to prior study. This may represent postinflammatory change. Consider sarcoidosis or fibrosing mediastinitis. Small bilateral pleural effusions with basilar atelectasis.  Included portions of the upper abdominal organs demonstrate extensive vascular calcifications in the celiac axis. Spleen appears to be enlarged. Degenerative changes in the spine.  IMPRESSION: Chronic pattern of multiple calcified hilar and mediastinal lymph nodes with perihilar and peribronchial infiltration and scarring. This likely represents inflammatory process. Consider fibrosing mediastinitis versus sarcoidosis. Small pericardial effusion. Small bilateral pleural effusions with basilar atelectasis. Can't exclude superimposed pneumonia in the lung bases,  particularly on the left. Changes in the left lung base have progressed since the previous study.   Electronically Signed   By: Lucienne Capers M.D.   On: 04/23/2015 03:40   Dg Chest Port 1 View  04/23/2015   CLINICAL DATA:  Shortness of breath.  No chest pain.  EXAM: PORTABLE CHEST - 1 VIEW  COMPARISON:  04/17/2015  FINDINGS: Prominent calcified lymph nodes in the hila and mediastinum bilaterally consistent with old granulomatous disease. Emphysematous changes and scattered fibrosis in the lungs. Apical pleural calcification bilaterally. Normal heart size and pulmonary vascularity. No focal airspace disease. Mild blunting of the left costophrenic angle appears to be chronic and may be due to pleural thickening. No pneumothorax. Calcified and tortuous aorta.  IMPRESSION: Emphysematous changes and fibrosis in the lungs. Pleural thickening in the left costophrenic angle. Prominent calcified lymph nodes suggesting old granulomatous disease. No evidence of active infiltration.   Electronically Signed   By: Lucienne Capers M.D.   On: 04/23/2015 00:11   Dg Chest Port 1 View  04/17/2015   CLINICAL DATA:  Dyspnea, COPD, CHF  EXAM: PORTABLE CHEST - 1 VIEW  COMPARISON:  03/28/2015  FINDINGS: A single upright portable view of the chest demonstrates chronic left base opacity and volume loss which appears to be unchanged over the past several examinations. There also are numerous granulomatous-appearing calcified nodes in the mediastinum and hila. Emphysematous and fibrotic changes are present bilaterally. No superimposed acute infiltrate or CHF is evident.  IMPRESSION: Chronic left base opacity and volume loss. Granulomatous changes. Emphysematous and fibrotic changes. No acute findings are evident.   Electronically Signed   By: Andreas Newport M.D.   On: 04/17/2015 21:57   Dg Chest Port 1 View  03/28/2015   CLINICAL DATA:  Severe shortness of breath.  EXAM: PORTABLE CHEST - 1 VIEW  COMPARISON:  Frontal and lateral  views 03/18/2015  FINDINGS: Heart at the upper limits of normal in size. Extensive calcified hilar and mediastinal lymph nodes, stable. Unchanged bilateral pleural effusions, left greater than right, small. Scarring noted in both lower lung zones. No confluent airspace disease, pulmonary edema, or pneumothorax. Hyperinflation and emphysema persists.  IMPRESSION: 1. Hyperinflation and emphysema with scarring, unchanged from prior exam. No definite superimposed acute process. 2. Unchanged  small bilateral pleural effusions, left greater than right. 3. Extensive calcified hilar or mediastinal adenopathy, also unchanged.   Electronically Signed   By: Jeb Levering M.D.   On: 03/28/2015 23:13    CBC  Recent Labs Lab 04/17/15 2150 04/18/15 0400 04/23/15 0022 04/23/15 0550  WBC 5.0 4.1 5.7 5.0  HGB 9.3* 9.5* 10.2* 10.5*  HCT 27.7* 28.4* 30.2* 30.8*  PLT 174 159 206 203  MCV 90.2 90.4 89.3 89.0  MCH 30.3 30.3 30.2 30.3  MCHC 33.6 33.5 33.8 34.1  RDW 15.6* 15.7* 15.1 15.2  LYMPHSABS 0.2*  --   --  0.4*  MONOABS 0.4  --   --  0.3  EOSABS 0.1  --   --  0.0  BASOSABS 0.0  --   --  0.0    Chemistries   Recent Labs Lab 04/17/15 2150 04/18/15 0400 04/23/15 0022 04/23/15 0550  NA 136 137 139 142  K 4.4 4.5 4.4 3.9  CL 105 105 104 106  CO2 21* 24 26 26   GLUCOSE 245* 303* 378* 223*  BUN 23* 24* 45* 39*  CREATININE 1.25* 1.18 1.37* 1.23  CALCIUM 8.5* 8.6* 9.0 9.2  AST 21  --   --  14*  ALT 17  --   --  13*  ALKPHOS 56  --   --  49  BILITOT 0.8  --   --  1.0   ------------------------------------------------------------------------------------------------------------------ estimated creatinine clearance is 37.3 mL/min (by C-G formula based on Cr of 1.23). ------------------------------------------------------------------------------------------------------------------ No results for input(s): HGBA1C in the last 72  hours. ------------------------------------------------------------------------------------------------------------------ No results for input(s): CHOL, HDL, LDLCALC, TRIG, CHOLHDL, LDLDIRECT in the last 72 hours. ------------------------------------------------------------------------------------------------------------------ No results for input(s): TSH, T4TOTAL, T3FREE, THYROIDAB in the last 72 hours.  Invalid input(s): FREET3 ------------------------------------------------------------------------------------------------------------------ No results for input(s): VITAMINB12, FOLATE, FERRITIN, TIBC, IRON, RETICCTPCT in the last 72 hours.  Coagulation profile No results for input(s): INR, PROTIME in the last 168 hours.  No results for input(s): DDIMER in the last 72 hours.  Cardiac Enzymes No results for input(s): CKMB, TROPONINI, MYOGLOBIN in the last 168 hours.  Invalid input(s): CK ------------------------------------------------------------------------------------------------------------------ Invalid input(s): POCBNP   Recent Labs  04/23/15 0759  GLUCAP 214*     Lumir Demetriou M.D. [facility] 04/23/2015, 12:16 PM  Pager: [phone]   Between 7am to 7pm - call Pager - [phone]  After 7pm go to [email or web address] - password [email or web address]  Call night coverage person covering after 7pm
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
## 2015-04-24 ENCOUNTER — Inpatient Hospital Stay (HOSPITAL_COMMUNITY): Payer: Medicare Other

## 2015-04-24 DIAGNOSIS — J438 Other emphysema: Secondary | ICD-10-CM

## 2015-04-24 DIAGNOSIS — I5032 Chronic diastolic (congestive) heart failure: Secondary | ICD-10-CM

## 2015-04-24 DIAGNOSIS — E119 Type 2 diabetes mellitus without complications: Secondary | ICD-10-CM

## 2015-04-24 DIAGNOSIS — N182 Chronic kidney disease, stage 2 (mild): Secondary | ICD-10-CM

## 2015-04-24 LAB — GLUCOSE, CAPILLARY
GLUCOSE-CAPILLARY: 278 mg/dL — AB (ref 65–99)
GLUCOSE-CAPILLARY: 308 mg/dL — AB (ref 65–99)
Glucose-Capillary: 232 mg/dL — ABNORMAL HIGH (ref 65–99)
Glucose-Capillary: 174 mg/dL — ABNORMAL HIGH (ref 65–99)

## 2015-04-24 MED ORDER — INSULIN ASPART 100 UNIT/ML ~~LOC~~ SOLN: 0.0000 [IU] | Freq: Every day | SUBCUTANEOUS | Status: DC

## 2015-04-24 MED ORDER — INSULIN GLARGINE 100 UNIT/ML ~~LOC~~ SOLN
5.0000 [IU] | Freq: Every day | SUBCUTANEOUS | Status: DC
  Administered 2015-04-24 – 2015-04-26 (×3): 5 [IU] via SUBCUTANEOUS
  Filled 2015-04-24 (×3): qty 0.05

## 2015-04-24 MED ORDER — INSULIN ASPART 100 UNIT/ML ~~LOC~~ SOLN
0.0000 [IU] | Freq: Three times a day (TID) | SUBCUTANEOUS | Status: DC
  Administered 2015-04-24 – 2015-04-25 (×2): 5 [IU] via SUBCUTANEOUS
  Administered 2015-04-25: 8 [IU] via SUBCUTANEOUS
  Administered 2015-04-25 – 2015-04-26 (×2): 2 [IU] via SUBCUTANEOUS
  Administered 2015-04-26: 5 [IU] via SUBCUTANEOUS

## 2015-04-24 MED ORDER — IPRATROPIUM-ALBUTEROL 0.5-2.5 (3) MG/3ML IN SOLN
3.0000 mL | Freq: Three times a day (TID) | RESPIRATORY_TRACT | Status: DC
  Administered 2015-04-24 – 2015-04-26 (×6): 3 mL via RESPIRATORY_TRACT
  Filled 2015-04-24 (×7): qty 3

## 2015-04-24 MED ORDER — INSULIN ASPART 100 UNIT/ML ~~LOC~~ SOLN
3.0000 [IU] | Freq: Three times a day (TID) | SUBCUTANEOUS | Status: DC
  Administered 2015-04-24 – 2015-04-26 (×5): 3 [IU] via SUBCUTANEOUS

## 2015-04-24 NOTE — Care Management Note (Signed)
Case Management Note  Patient Details  Name: HANSON MEDEIROS MRN: [id number] Date of Birth: [birth year]  Subjective/Objective:  Admitted with Acute Resp Failure with Hypoxia                  Action/Plan: Talked to [name] POA [phone]) She is the primary caregiver for the patient and she lives with the patient. [facility] provides a private nurses aide to sit with the patient form 8 am - 6 pm and has [facility] for [facility] / PT; Patient was discussed in LOS meeting due to frequent readmission and was referred to [place] for [facility] CM talked to Anderson Malta about if she wanted to change [facility] agencies. Anderson Malta does not want to change [place] agencies at this time. Sheppard Evens with [facility] made aware.  Anderson Malta stated that patient has been in 2 different SNF ( [facility] and [facility]) but did not like the care and decided to care for him at home. No DME needed at this time.  Expected Discharge Date:   possibly 04/27/2015             Expected Discharge Plan:  [place]  Discharge planning Services  CM Consult    Choice offered to:  [place] / Guardian    HH Arranged:  RN, PT [facility] Agency:  [facility]  If discussed at [place] of Stay Meetings, dates discussed:  04/23/2015   Sherrilyn Rist [phone] 04/24/2015, 2:23 PM

## 2015-04-24 NOTE — Clinical Documentation Improvement (Signed)
Pt is known to have CKD stage II. BUN on admission and subsequent values - 23, 24, 45, 36.  Creatinine - 1.25, 1.18, 1.37, 1.23  Would you please help clarify the medical condition associated with clinical findings? Acute renal failure, unspecified Acute renal failure with ATN Only Chronic renal failure Unable to determine at this time Other clinical explanation.   Thank You, Margretta Sidle ,RN Clinical Documentation Specialist:    [place] [facility] [phone] Cell - [phone]

## 2015-04-24 NOTE — Progress Notes (Signed)
MBSS complete. Full report located under chart review in imaging section. Emely Fahy, MA CCC-SLP [phone]  
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
## 2015-04-25 LAB — GLUCOSE, CAPILLARY
GLUCOSE-CAPILLARY: 142 mg/dL — AB (ref 65–99)
GLUCOSE-CAPILLARY: 280 mg/dL — AB (ref 65–99)
Glucose-Capillary: 216 mg/dL — ABNORMAL HIGH (ref 65–99)
Glucose-Capillary: 124 mg/dL — ABNORMAL HIGH (ref 65–99)

## 2015-04-25 LAB — HEMOGLOBIN A1C
HEMOGLOBIN A1C: 6.6 % — AB (ref 4.8–5.6)
MEAN PLASMA GLUCOSE: 143 mg/dL

## 2015-04-25 MED ORDER — PREDNISONE 20 MG PO TABS
40.0000 mg | ORAL_TABLET | Freq: Every day | ORAL | Status: DC
  Administered 2015-04-25 – 2015-04-26 (×2): 40 mg via ORAL
  Filled 2015-04-25 (×3): qty 2

## 2015-04-25 MED ORDER — CARVEDILOL 3.125 MG PO TABS
3.1250 mg | ORAL_TABLET | Freq: Two times a day (BID) | ORAL | Status: DC
  Administered 2015-04-26: 3.125 mg via ORAL
  Filled 2015-04-25 (×3): qty 1

## 2015-04-25 MED ORDER — ISOSORBIDE MONONITRATE ER 30 MG PO TB24: 30.0000 mg | ORAL_TABLET | Freq: Every day | ORAL | Status: DC

## 2015-04-25 MED ORDER — CARVEDILOL 25 MG PO TABS
25.0000 mg | ORAL_TABLET | Freq: Two times a day (BID) | ORAL | Status: DC
  Administered 2015-04-25: 25 mg via ORAL
  Filled 2015-04-25 (×3): qty 1

## 2015-04-25 MED ORDER — ENSURE ENLIVE PO LIQD
237.0000 mL | Freq: Two times a day (BID) | ORAL | Status: DC
  Administered 2015-04-25 – 2015-04-26 (×3): 237 mL via ORAL

## 2015-04-25 NOTE — Progress Notes (Signed)
Patient alert oriented x1, denies pain, no c/o shortness of breath. Patient have non productive cough. Will continue to monitor patient.

## 2015-04-25 NOTE — Progress Notes (Signed)
Attempt to ambulate patient per MD order, patient refused, sat patient at the side of the bed, patient c/o dizziness. V/S stable. Wean patient off oxygen. 97%/RA, no shortness of breath. Will notified MD and continue to monitor patient.
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
## 2015-04-26 LAB — GLUCOSE, CAPILLARY
GLUCOSE-CAPILLARY: 204 mg/dL — AB (ref 65–99)
Glucose-Capillary: 150 mg/dL — ABNORMAL HIGH (ref 65–99)

## 2015-04-26 LAB — CBC
HEMATOCRIT: 29.7 % — AB (ref 39.0–52.0)
HEMOGLOBIN: 10.1 g/dL — AB (ref 13.0–17.0)
MCH: 30.1 pg (ref 26.0–34.0)
MCHC: 34 g/dL (ref 30.0–36.0)
MCV: 88.7 fL (ref 78.0–100.0)
PLATELETS: 172 10*3/uL (ref 150–400)
RBC: 3.35 MIL/uL — ABNORMAL LOW (ref 4.22–5.81)
RDW: 15.1 % (ref 11.5–15.5)
WBC: 5.1 10*3/uL (ref 4.0–10.5)

## 2015-04-26 LAB — BASIC METABOLIC PANEL
Anion gap: 11 (ref 5–15)
BUN: 41 mg/dL — ABNORMAL HIGH (ref 6–20)
CHLORIDE: 102 mmol/L (ref 101–111)
CO2: 25 mmol/L (ref 22–32)
CREATININE: 1.4 mg/dL — AB (ref 0.61–1.24)
Calcium: 8.7 mg/dL — ABNORMAL LOW (ref 8.9–10.3)
GFR calc Af Amer: 52 mL/min — ABNORMAL LOW (ref >60)
GFR calc non Af Amer: 45 mL/min — ABNORMAL LOW (ref >60)
Glucose, Bld: 103 mg/dL — ABNORMAL HIGH (ref 65–99)
Potassium: 3.5 mmol/L (ref 3.5–5.1)
Sodium: 138 mmol/L (ref 135–145)

## 2015-04-26 MED ORDER — LEVOFLOXACIN 500 MG PO TABS
500.0000 mg | ORAL_TABLET | Freq: Once | ORAL | Status: AC
  Administered 2015-04-26: 500 mg via ORAL
  Filled 2015-04-26: qty 1

## 2015-04-26 MED ORDER — POTASSIUM CHLORIDE 20 MEQ/15ML (10%) PO SOLN
10.0000 meq | Freq: Every day | ORAL | Status: DC
  Administered 2015-04-26: 10 meq via ORAL
  Filled 2015-04-26: qty 7.5

## 2015-04-26 MED ORDER — POTASSIUM CHLORIDE ER 10 MEQ PO TBCR: 10.0000 meq | EXTENDED_RELEASE_TABLET | Freq: Every day | ORAL | Status: DC

## 2015-04-26 MED ORDER — PREDNISONE 10 MG PO TABS: ORAL_TABLET | ORAL | Status: DC

## 2015-04-26 MED ORDER — LEVOFLOXACIN 250 MG PO TABS: 250.0000 mg | ORAL_TABLET | Freq: Every day | ORAL | Status: DC

## 2015-04-26 MED ORDER — PANTOPRAZOLE SODIUM 40 MG PO PACK
40.0000 mg | PACK | Freq: Every day | ORAL | Status: DC
  Administered 2015-04-26: 40 mg via ORAL
  Filled 2015-04-26: qty 20

## 2015-04-26 MED ORDER — BENZONATATE 100 MG PO CAPS: 100.0000 mg | ORAL_CAPSULE | Freq: Three times a day (TID) | ORAL | Status: DC | PRN

## 2015-04-26 MED ORDER — FUROSEMIDE 40 MG PO TABS: 40.0000 mg | ORAL_TABLET | Freq: Every day | ORAL | Status: DC

## 2015-04-26 MED ORDER — CARVEDILOL 3.125 MG PO TABS: 3.1250 mg | ORAL_TABLET | Freq: Two times a day (BID) | ORAL | Status: DC

## 2015-04-26 MED ORDER — ISOSORBIDE MONONITRATE ER 60 MG PO TB24: 60.0000 mg | ORAL_TABLET | Freq: Every day | ORAL | Status: DC

## 2015-04-26 MED ORDER — PANTOPRAZOLE SODIUM 40 MG PO PACK: 40.0000 mg | PACK | Freq: Every day | ORAL | Status: DC

## 2015-04-26 NOTE — Progress Notes (Signed)
Prednisone prescription called to [facility] on [address]. Will notified patient caregiver.

## 2015-04-26 NOTE — Discharge Summary (Signed)
Physician Discharge Summary   Patient ID: [name] MRN: [id number] DOB/AGE: 13-Aug-1929 81 y.o.  Admit date: 04/22/2015 Discharge date: 04/26/2015  Primary Care Physician:   Melinda Crutch, MD  Discharge Diagnoses:    . COPD (chronic obstructive pulmonary disease) . Acute respiratory failure with hypoxia . Chronic diastolic CHF (congestive heart failure) . CKD (chronic kidney disease), stage II . Essential hypertension  Consults: None   Recommendations for Outpatient Follow-up:  Please obtain chest x-ray in 2-3 weeks for complete resolution of pneumonia   TESTS THAT NEED FOLLOW-UP CBC, BMET   DIET: Heart healthy diet    Allergies:   Allergies  Allergen Reactions  . Dulera [Mometasone Furo-Formoterol Fum] Other (See Comments)    Causes facial/oral/nasal burning progressing to urinary tract blockage  . Pioglitazone Cough  . Ramipril Cough     Discharge Medications:   Medication List    STOP taking these medications        azithromycin 250 MG tablet  Commonly known as:  ZITHROMAX     nitrofurantoin (macrocrystal-monohydrate) 100 MG capsule  Commonly known as:  MACROBID      TAKE these medications        acetaminophen 325 MG tablet  Commonly known as:  TYLENOL  Take 325 mg by mouth every 6 (six) hours as needed for mild pain or moderate pain.     albuterol 108 (90 BASE) MCG/ACT inhaler  Commonly known as:  PROVENTIL HFA;VENTOLIN HFA  Inhale 1-2 puffs into the lungs every 6 (six) hours as needed for wheezing or shortness of breath.     aspirin 81 MG chewable tablet  Chew 81 mg by mouth daily at 6 PM.     benzonatate 100 MG capsule  Commonly known as:  TESSALON PERLES  Take 1 capsule (100 mg total) by mouth 3 (three) times daily as needed for cough.     carvedilol 3.125 MG tablet  Commonly known as:  COREG  Take 1 tablet (3.125 mg total) by mouth 2 (two) times daily with a meal.     cholecalciferol 1000 UNITS tablet  Commonly known as:   VITAMIN D  Take 2,000 Units by mouth daily.     clopidogrel 75 MG tablet  Commonly known as:  PLAVIX  TAKE 1 TABLET DAILY     DEXILANT 60 MG capsule  Generic drug:  dexlansoprazole  Take 60 mg by mouth daily.     docusate sodium 100 MG capsule  Commonly known as:  COLACE  Take 1 capsule (100 mg total) by mouth 2 (two) times daily.     Fish Oil 1200 MG Caps  Take 1,200 mg by mouth 2 (two) times daily. 9am and 5pm     furosemide 40 MG tablet  Commonly known as:  LASIX  Take 1 tablet (40 mg total) by mouth daily.  Start taking on:  04/27/2015     ipratropium-albuterol 0.5-2.5 (3) MG/3ML Soln  Commonly known as:  DUONEB  Take 3 mLs by nebulization every 6 (six) hours as needed.     isosorbide mononitrate 60 MG 24 hr tablet  Commonly known as:  IMDUR  Take 1 tablet (60 mg total) by mouth daily. HOLD UNTIL YOU FOLLOW-UP WITH PCP     levofloxacin 250 MG tablet  Commonly known as:  LEVAQUIN  Take 1 tablet (250 mg total) by mouth daily. X 5days  Start taking on:  04/27/2015     mometasone-formoterol 100-5 MCG/ACT Aero  Commonly known as:  [employer]  Inhale 2 puffs into the lungs 2 (two) times daily.     pantoprazole sodium 40 mg/20 mL Pack  Commonly known as:  PROTONIX  Take 20 mLs (40 mg total) by mouth daily.     potassium chloride 10 MEQ tablet  Commonly known as:  K-DUR  Take 1 tablet (10 mEq total) by mouth daily.     predniSONE 10 MG tablet  Commonly known as:  DELTASONE  Prednisone dosing: Take  Prednisone 40mg  (4 tabs) x 2 days, then taper to 30mg  (3 tabs) x 3 days, then 20mg  (2 tabs) x 3days, then 10mg  (1 tab) x 3days, then OFF.  Dispense:  26 tabs, refills: None     rosuvastatin 10 MG tablet  Commonly known as:  CRESTOR  Take 10 mg by mouth at bedtime.     sertraline 50 MG tablet  Commonly known as:  ZOLOFT  Take 1 tablet (50 mg total) by mouth at bedtime.     tamsulosin 0.4 MG Caps capsule  Commonly known as:  FLOMAX  Take 1 capsule (0.4 mg total) by mouth  daily.     tiotropium 18 MCG inhalation capsule  Commonly known as:  SPIRIVA HANDIHALER  Place 1 capsule (18 mcg total) into inhaler and inhale daily.         Brief H and P: For complete details please refer to admission H and P, but in briefPatient is a 81 year old male with COPD, CAD, chronic diastolic CHF, dyslipidemia, hypertension, GERD who was recently admitted to the hospital for COPD and discharged 3 days ago was brought to the ER to patient's healthcare power of attorney with whom patient lives found patient getting short of breath acutely. As per patient's healthcare power of attorney patient has been having increasing cough mostly in the nighttime. Patient did not have any chest pain nausea vomiting or abdominal pain. Since patient was getting very short of breath he was was called and brought to the ER. Initially patient was placed on C Pap. Following which patient's symptoms improved and on exam patient has coarse breath sounds with patient having productive cough. Chest x-ray shows some pleural thickening. Patient does have chronic lung disease and chronic histoplasmosis. Patient has been admitted for further management.   Hospital Course:  Acute respiratory failure with hypoxia possibly due to aspiration/PNA, underlying COPD - Possibly due to aspiration pneumonia, patient noted to be coughing during breakfast. CT chest reviewed, chronic multiple calcified hilar and mediastinal lymph nodes, perihilar and peribronchial infiltration, likely inflammatory process fibrosing mediastinitis versus sarcoidosis, can't exclude super imposed pneumonia in the lung bases. Patient was initially placed on IV vancomycin and Zosyn, which she received for 4 days, transition to oral levofloxacin to complete the course. Patient also transitioned to oral prednisone with a taper, albuterol nebs, dulera.  Swallow evaluation was obtained and patient cleared the speech therapy, on regular diet. Aspiration  precautions were explained to the patient and the caregiver. [facility] was arranged.     Diabetes mellitus, type 2: Diet controlled outpatient -Somewhat uncontrolled inpatient due to IV fluids although expected to improve with tapering of prednisone    Essential hypertension - Currently stable, Coreg   Chronic CKD (chronic kidney disease), stage II - Currently stable, follow creatinine function, on Lasix    Mild acute on Chronic diastolic CHF (congestive heart failure) - Continue Lasix, negative balance of 2.2 L, which down from 136 to 128lbs  - 2-D echo in 5/16 showed EF of 60-65% with grade 1 diastolic  dysfunction     Day of Discharge BP 119/62 mmHg  Pulse 64  Temp(Src) 97.2 F (36.2 C) (Axillary)  Resp 18  Ht 5\' 5"  (1.651 m)  Wt 58.2 kg (128 lb 4.9 oz)  BMI 21.35 kg/m2  SpO2 98%  Physical Exam: General: Alert and awake oriented x3 not in any acute distress. HEENT: anicteric sclera, pupils reactive to light and accommodation CVS: S1-S2 clear no murmur rubs or gallops Chest: clear to auscultation bilaterally, no wheezing rales or rhonchi Abdomen: soft nontender, nondistended, normal bowel sounds Extremities: no cyanosis, clubbing or edema noted bilaterally Neuro: Cranial nerves II-XII intact, no focal neurological deficits   The results of significant diagnostics from this hospitalization (including imaging, microbiology, ancillary and laboratory) are listed below for reference.    LAB RESULTS: Basic Metabolic Panel:  Recent Labs Lab 04/23/15 0550 04/26/15 0434  NA 142 138  K 3.9 3.5  CL 106 102  CO2 26 25  GLUCOSE 223* 103*  BUN 39* 41*  CREATININE 1.23 1.40*  CALCIUM 9.2 8.7*   Liver Function Tests:  Recent Labs Lab 04/23/15 0550  AST 14*  ALT 13*  ALKPHOS 49  BILITOT 1.0  PROT 6.1*  ALBUMIN 3.2*   No results for input(s): LIPASE, AMYLASE in the last 168 hours. No results for input(s): AMMONIA in the last 168 hours. CBC:  Recent Labs Lab  04/23/15 0550 04/26/15 0434  WBC 5.0 5.1  NEUTROABS 4.3  --   HGB 10.5* 10.1*  HCT 30.8* 29.7*  MCV 89.0 88.7  PLT 203 172   Cardiac Enzymes: No results for input(s): CKTOTAL, CKMB, CKMBINDEX, TROPONINI in the last 168 hours. BNP: Invalid input(s): POCBNP CBG:  Recent Labs Lab 04/25/15 2112 04/26/15 0656  GLUCAP 124* 150*    Significant Diagnostic Studies:  Ct Chest Wo Contrast  04/23/2015   CLINICAL DATA:  Shortness of breath for 2 days. Discharge from hospital on Monday. Cough and tachypneic.  EXAM: CT CHEST WITHOUT CONTRAST  TECHNIQUE: Multidetector CT imaging of the chest was performed following the standard protocol without IV contrast.  COMPARISON:  06/04/2007  FINDINGS: Mild cardiac enlargement. Small pericardial effusion. Coronary artery and aortic valve calcifications. Normal caliber thoracic aorta with calcification. Extensive multiple calcified lymph nodes throughout the mediastinum and in the hila bilaterally. This likely represents postinflammatory change. No change since previous study. Esophagus is decompressed. Small esophageal hiatal hernia.  Prominent emphysematous changes in the lungs with bilateral apical bulla. Perihilar and perivascular infiltration or scarring similar to prior study. This may represent postinflammatory change. Consider sarcoidosis or fibrosing mediastinitis. Small bilateral pleural effusions with basilar atelectasis.  Included portions of the upper abdominal organs demonstrate extensive vascular calcifications in the celiac axis. Spleen appears to be enlarged. Degenerative changes in the spine.  IMPRESSION: Chronic pattern of multiple calcified hilar and mediastinal lymph nodes with perihilar and peribronchial infiltration and scarring. This likely represents inflammatory process. Consider fibrosing mediastinitis versus sarcoidosis. Small pericardial effusion. Small bilateral pleural effusions with basilar atelectasis. Can't exclude superimposed  pneumonia in the lung bases, particularly on the left. Changes in the left lung base have progressed since the previous study.   Electronically Signed   By: Lucienne Capers M.D.   On: 04/23/2015 03:40   Dg Chest Port 1 View  04/23/2015   CLINICAL DATA:  Shortness of breath.  No chest pain.  EXAM: PORTABLE CHEST - 1 VIEW  COMPARISON:  04/17/2015  FINDINGS: Prominent calcified lymph nodes in the hila and mediastinum bilaterally consistent with  old granulomatous disease. Emphysematous changes and scattered fibrosis in the lungs. Apical pleural calcification bilaterally. Normal heart size and pulmonary vascularity. No focal airspace disease. Mild blunting of the left costophrenic angle appears to be chronic and may be due to pleural thickening. No pneumothorax. Calcified and tortuous aorta.  IMPRESSION: Emphysematous changes and fibrosis in the lungs. Pleural thickening in the left costophrenic angle. Prominent calcified lymph nodes suggesting old granulomatous disease. No evidence of active infiltration.   Electronically Signed   By: Lucienne Capers M.D.   On: 04/23/2015 00:11    2D ECHO:  Study Conclusions  - Left ventricle: The cavity size was normal. There was mild concentric hypertrophy. Systolic function was normal. The estimated ejection fraction was in the range of 60% to 65%. Doppler parameters are consistent with abnormal left ventricular relaxation (grade 1 diastolic dysfunction). - Aortic valve: Mildly to moderately calcified annulus. Mildly thickened leaflets. There appears to be some degree of cusp fusion. Incompletely visualized. Morphologically, there appears to be at least mild aortic stenosis. - Mitral valve: Mildly calcified annulus. - Left atrium: The atrium was mildly dilated. Volume/bsa, ES, (1-plane Simpson&'s, A2C): 32.2 ml/m^2. - Right atrium: The atrium was mildly dilated.  Disposition and Follow-up: Discharge Instructions    (HEART FAILURE PATIENTS)  Call MD:  Anytime you have any of the following symptoms: 1) 3 pound weight gain in 24 hours or 5 pounds in 1 week 2) shortness of breath, with or without a dry hacking cough 3) swelling in the hands, feet or stomach 4) if you have to sleep on extra pillows at night in order to breathe.    Complete by:  As directed      Diet - low sodium heart healthy    Complete by:  As directed      Discharge instructions    Complete by:  As directed   Please HOLD imdur until you follow-up with your doctor     Increase activity slowly    Complete by:  As directed             DISPOSITION: HOME    DISCHARGE FOLLOW-UP     Follow-up Information    Follow up with  Melinda Crutch, MD. Schedule an appointment as soon as possible for a visit in 10 days.   Specialty:  Family Medicine   Why:  for hospital follow-up   Contact information:   [place] [address] [phone]        Time spent on Discharge: 60 MINS   Signed:   RAI,RIPUDEEP M.D. [facility] 04/26/2015, 11:58 AM Pager: [phone]

## 2015-04-26 NOTE — Progress Notes (Signed)
Patient alert and oriented, denies pain, no c/o of shortness of breath. V/S stable, iv and tele d/c. D/C instruction explain and given to the caregiver. Caregiver verbalized understanding. Patient d/c per order.

## 2015-04-27 ENCOUNTER — Other Ambulatory Visit: Payer: Self-pay | Admitting: *Deleted

## 2015-04-27 NOTE — Patient Outreach (Signed)
[facility]) Care Management  04/27/2015  DALIN CALDERA September 29, 1929 [id number]  Assessment: Transition of care call completed. Spoke with [place] attorney, who reports that patient is fine now, breathing ok after discharge from hospital yesterday. She states she had arranged home care services to provide care for patient at home while she is at work.  Care management coordinator reviewed medication list with Ms.Snider.  Ms. Baxter Flattery states patient has all his medications except for Protonix which she will get refilled today. She reports that she assists patient in managing his medications.   Ms. Baxter Flattery had expressed her concerns of spending all her time taking care of patient outside of her work and no time left for herself. She was very eager to know what benefits she can get from our services that she does not already have in place or know about. Discussed with her at length about [facility] care management services that we provide including educational packets for COPD, Heart Failure and EMMI videos/ handouts. Ms. Baxter Flattery stated she will leave the e-mail address at home with other caregivers. She agrees to schedule a home visit with patient on April 30, 2015.    Plan: Home visit this week as scheduled.           Will provide educational packets.           Will discuss caregiver support.             Jazzmyne Rasnick A. Hooper Petteway, BSN, RN-BC [place] Coordinator Cell: [phone]

## 2015-04-28 ENCOUNTER — Emergency Department (HOSPITAL_BASED_OUTPATIENT_CLINIC_OR_DEPARTMENT_OTHER)
Admission: EM | Admit: 2015-04-28 | Discharge: 2015-04-28 | Disposition: A | Discharge status: Home / Self Care | Payer: Medicare Other | Attending: Emergency Medicine | Admitting: Emergency Medicine

## 2015-04-28 ENCOUNTER — Emergency Department (HOSPITAL_BASED_OUTPATIENT_CLINIC_OR_DEPARTMENT_OTHER): Payer: Medicare Other

## 2015-04-28 ENCOUNTER — Encounter (HOSPITAL_BASED_OUTPATIENT_CLINIC_OR_DEPARTMENT_OTHER): Payer: Self-pay | Admitting: *Deleted

## 2015-04-28 DIAGNOSIS — I10 Essential (primary) hypertension: Secondary | ICD-10-CM | POA: Diagnosis not present

## 2015-04-28 DIAGNOSIS — Z87828 Personal history of other (healed) physical injury and trauma: Secondary | ICD-10-CM | POA: Insufficient documentation

## 2015-04-28 DIAGNOSIS — E785 Hyperlipidemia, unspecified: Secondary | ICD-10-CM | POA: Diagnosis not present

## 2015-04-28 DIAGNOSIS — J159 Unspecified bacterial pneumonia: Secondary | ICD-10-CM | POA: Diagnosis not present

## 2015-04-28 DIAGNOSIS — Z7982 Long term (current) use of aspirin: Secondary | ICD-10-CM | POA: Diagnosis not present

## 2015-04-28 DIAGNOSIS — Z9861 Coronary angioplasty status: Secondary | ICD-10-CM | POA: Insufficient documentation

## 2015-04-28 DIAGNOSIS — Z87448 Personal history of other diseases of urinary system: Secondary | ICD-10-CM | POA: Insufficient documentation

## 2015-04-28 DIAGNOSIS — M1990 Unspecified osteoarthritis, unspecified site: Secondary | ICD-10-CM | POA: Insufficient documentation

## 2015-04-28 DIAGNOSIS — E119 Type 2 diabetes mellitus without complications: Secondary | ICD-10-CM | POA: Insufficient documentation

## 2015-04-28 DIAGNOSIS — Z8546 Personal history of malignant neoplasm of prostate: Secondary | ICD-10-CM | POA: Insufficient documentation

## 2015-04-28 DIAGNOSIS — R0602 Shortness of breath: Secondary | ICD-10-CM | POA: Diagnosis present

## 2015-04-28 DIAGNOSIS — K219 Gastro-esophageal reflux disease without esophagitis: Secondary | ICD-10-CM | POA: Diagnosis not present

## 2015-04-28 DIAGNOSIS — I5032 Chronic diastolic (congestive) heart failure: Secondary | ICD-10-CM | POA: Insufficient documentation

## 2015-04-28 DIAGNOSIS — Z85828 Personal history of other malignant neoplasm of skin: Secondary | ICD-10-CM | POA: Insufficient documentation

## 2015-04-28 DIAGNOSIS — J441 Chronic obstructive pulmonary disease with (acute) exacerbation: Secondary | ICD-10-CM | POA: Insufficient documentation

## 2015-04-28 DIAGNOSIS — Z792 Long term (current) use of antibiotics: Secondary | ICD-10-CM | POA: Diagnosis not present

## 2015-04-28 DIAGNOSIS — J189 Pneumonia, unspecified organism: Secondary | ICD-10-CM

## 2015-04-28 DIAGNOSIS — I2510 Atherosclerotic heart disease of native coronary artery without angina pectoris: Secondary | ICD-10-CM | POA: Insufficient documentation

## 2015-04-28 DIAGNOSIS — I252 Old myocardial infarction: Secondary | ICD-10-CM | POA: Insufficient documentation

## 2015-04-28 DIAGNOSIS — R531 Weakness: Secondary | ICD-10-CM | POA: Diagnosis not present

## 2015-04-28 DIAGNOSIS — R0600 Dyspnea, unspecified: Secondary | ICD-10-CM

## 2015-04-28 LAB — CBC
HEMATOCRIT: 30.3 % — AB (ref 39.0–52.0)
HEMOGLOBIN: 10.2 g/dL — AB (ref 13.0–17.0)
MCH: 30.7 pg (ref 26.0–34.0)
MCHC: 33.7 g/dL (ref 30.0–36.0)
MCV: 91.3 fL (ref 78.0–100.0)
Platelets: 172 10*3/uL (ref 150–400)
RBC: 3.32 MIL/uL — AB (ref 4.22–5.81)
RDW: 14.4 % (ref 11.5–15.5)
WBC: 6.3 10*3/uL (ref 4.0–10.5)

## 2015-04-28 LAB — CULTURE, BLOOD (ROUTINE X 2)
CULTURE: NO GROWTH
Culture: NO GROWTH

## 2015-04-28 LAB — BASIC METABOLIC PANEL
Anion gap: 12 (ref 5–15)
BUN: 51 mg/dL — ABNORMAL HIGH (ref 6–20)
CO2: 25 mmol/L (ref 22–32)
Calcium: 9 mg/dL (ref 8.9–10.3)
Chloride: 101 mmol/L (ref 101–111)
Creatinine, Ser: 1.24 mg/dL (ref 0.61–1.24)
GFR calc Af Amer: 60 mL/min — ABNORMAL LOW (ref >60)
GFR, EST NON AFRICAN AMERICAN: 52 mL/min — AB (ref >60)
GLUCOSE: 369 mg/dL — AB (ref 65–99)
POTASSIUM: 4.1 mmol/L (ref 3.5–5.1)
SODIUM: 138 mmol/L (ref 135–145)

## 2015-04-28 LAB — TROPONIN I: Troponin I: <0.03 ng/mL (ref <0.031)

## 2015-04-28 LAB — BRAIN NATRIURETIC PEPTIDE: B Natriuretic Peptide: 304.9 pg/mL — ABNORMAL HIGH (ref 0.0–100.0)

## 2015-04-28 MED ORDER — GUAIFENESIN ER 600 MG PO TB12: 600.0000 mg | ORAL_TABLET | Freq: Two times a day (BID) | ORAL | Status: DC

## 2015-04-28 MED ORDER — ALBUTEROL SULFATE (2.5 MG/3ML) 0.083% IN NEBU
2.5000 mg | INHALATION_SOLUTION | Freq: Once | RESPIRATORY_TRACT | Status: AC
  Administered 2015-04-28: 2.5 mg via RESPIRATORY_TRACT
  Filled 2015-04-28: qty 3

## 2015-04-28 MED ORDER — PREDNISONE 20 MG PO TABS: ORAL_TABLET | ORAL | Status: DC

## 2015-04-28 NOTE — ED Notes (Signed)
Sob. Productive cough. Recent admission for pneumonia.
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
## 2015-04-28 NOTE — Discharge Instructions (Signed)
Avoid activities that make you short of breath. Continue your antibiotics as prescribed. Keep your prednisone at 40 mg per day until you have had 48 hours of improvement. Mucinex as prescribed to loosen secretions.    Pneumonia Pneumonia is an infection of the lungs.  CAUSES Pneumonia may be caused by bacteria or a virus. Usually, these infections are caused by breathing infectious particles into the lungs (respiratory tract). SIGNS AND SYMPTOMS   Cough.  Fever.  Chest pain.  Increased rate of breathing.  Wheezing.  Mucus production. DIAGNOSIS  If you have the common symptoms of pneumonia, your health care provider will typically confirm the diagnosis with a chest X-ray. The X-ray will show an abnormality in the lung (pulmonary infiltrate) if you have pneumonia. Other tests of your blood, urine, or sputum may be done to find the specific cause of your pneumonia. Your health care provider may also do tests (blood gases or pulse oximetry) to see how well your lungs are working. TREATMENT  Some forms of pneumonia may be spread to other people when you cough or sneeze. You may be asked to wear a mask before and during your exam. Pneumonia that is caused by bacteria is treated with antibiotic medicine. Pneumonia that is caused by the influenza virus may be treated with an antiviral medicine. Most other viral infections must run their course. These infections will not respond to antibiotics.  HOME CARE INSTRUCTIONS   Cough suppressants may be used if you are losing too much rest. However, coughing protects you by clearing your lungs. You should avoid using cough suppressants if you can.  Your health care provider may have prescribed medicine if he or she thinks your pneumonia is caused by bacteria or influenza. Finish your medicine even if you start to feel better.  Your health care provider may also prescribe an expectorant. This loosens the mucus to be coughed up.  Take medicines only  as directed by your health care provider.  Do not smoke. Smoking is a common cause of bronchitis and can contribute to pneumonia. If you are a smoker and continue to smoke, your cough may last several weeks after your pneumonia has cleared.  A cold steam vaporizer or humidifier in your room or home may help loosen mucus.  Coughing is often worse at night. Sleeping in a semi-upright position in a recliner or using a couple pillows under your head will help with this.  Get rest as you feel it is needed. Your body will usually let you know when you need to rest. PREVENTION A pneumococcal shot (vaccine) is available to prevent a common bacterial cause of pneumonia. This is usually suggested for:  People over 78 years old.  Patients on chemotherapy.  People with chronic lung problems, such as bronchitis or emphysema.  People with immune system problems. If you are over 65 or have a high risk condition, you may receive the pneumococcal vaccine if you have not received it before. In some countries, a routine influenza vaccine is also recommended. This vaccine can help prevent some cases of pneumonia.You may be offered the influenza vaccine as part of your care. If you smoke, it is time to quit. You may receive instructions on how to stop smoking. Your health care provider can provide medicines and counseling to help you quit. SEEK MEDICAL CARE IF: You have a fever. SEEK IMMEDIATE MEDICAL CARE IF:   Your illness becomes worse. This is especially true if you are elderly or weakened from any  other disease.  You cannot control your cough with suppressants and are losing sleep.  You begin coughing up blood.  You develop pain which is getting worse or is uncontrolled with medicines.  Any of the symptoms which initially brought you in for treatment are getting worse rather than better.  You develop shortness of breath or chest pain. MAKE SURE YOU:   Understand these instructions.  Will  watch your condition.  Will get help right away if you are not doing well or get worse. Document Released: 10/24/2005 Document Revised: 03/10/2014 Document Reviewed: 01/13/2011 [facility] Patient Information 2015 [place], [place]. This information is not intended to replace advice given to you by your health care provider. Make sure you discuss any questions you have with your health care provider.

## 2015-04-30 ENCOUNTER — Encounter: Payer: Self-pay | Admitting: *Deleted

## 2015-04-30 ENCOUNTER — Other Ambulatory Visit: Payer: Self-pay | Admitting: *Deleted

## 2015-04-30 NOTE — Patient Outreach (Addendum)
[name]   04/30/2015  [name] 06-05-1929 [id number]  [name] is an 81 y.o. male  Subjective: "I am good up to this point after leaving the hospital"; Patient states he doesn't want to go back to the hospital again. "I'm doing what I am suppose to",per patient.  He further states, "Right at the moment, I have plenty of help from different directions". Patient denies of pain at this time and reports of intermittent pain felt to buttocks depending on his position.   Objective:   Review of Systems  HENT: Positive for hearing loss.   Eyes:       Wears glasses  Respiratory: Positive for cough and shortness of breath.        Non productive, intermittent cough Noted shortness of breath at intervals when patient is talking a long time. Coarse breath sounds all throughout lung fields but no wheezing.  Cardiovascular: Positive for leg swelling.       Non pitting, trace edema to bilateral lower extremities  Musculoskeletal: Positive for falls.       History of falls  Skin:       Skin is thin and fragile with bruises all over upper and lower extremities  Endo/Heme/Allergies: Bruises/bleeds easily.       Takes Plavix    Physical Exam  Current Medications:   Current Outpatient Prescriptions  Medication Sig Dispense Refill  . acetaminophen (TYLENOL) 325 MG tablet Take 325 mg by mouth every 6 (six) hours as needed for mild pain or moderate pain.    Marland Kitchen albuterol (PROVENTIL HFA;VENTOLIN HFA) 108 (90 BASE) MCG/ACT inhaler Inhale 1-2 puffs into the lungs every 6 (six) hours as needed for wheezing or shortness of breath.    Marland Kitchen aspirin 81 MG chewable tablet Chew 81 mg by mouth daily at 6 PM.    . carvedilol (COREG) 3.125 MG tablet Take 1 tablet (3.125 mg total) by mouth 2 (two) times daily with a meal. 60 tablet 3  . cholecalciferol (VITAMIN D) 1000 UNITS tablet Take 2,000 Units by mouth daily.    . clopidogrel (PLAVIX) 75 MG tablet TAKE 1  TABLET DAILY 90 tablet 3  . dexlansoprazole (DEXILANT) 60 MG capsule Take 60 mg by mouth daily.    Marland Kitchen docusate sodium (COLACE) 100 MG capsule Take 1 capsule (100 mg total) by mouth 2 (two) times daily. 10 capsule 0  . furosemide (LASIX) 40 MG tablet Take 1 tablet (40 mg total) by mouth daily. 30 tablet 0  . guaiFENesin (MUCINEX) 600 MG 12 hr tablet Take 1 tablet (600 mg total) by mouth 2 (two) times daily. 14 tablet 0  . ipratropium-albuterol (DUONEB) 0.5-2.5 (3) MG/3ML SOLN Take 3 mLs by nebulization every 6 (six) hours as needed. (Patient taking differently: Take 3 mLs by nebulization every 6 (six) hours as needed. For shortness of breath) 360 mL 0  . levofloxacin (LEVAQUIN) 250 MG tablet Take 1 tablet (250 mg total) by mouth daily. X 5days 5 tablet 0  . Omega-3 Fatty Acids (FISH OIL) 1200 MG CAPS Take 1,200 mg by mouth 2 (two) times daily. 9am and 5pm    . potassium chloride (K-DUR) 10 MEQ tablet Take 1 tablet (10 mEq total) by mouth daily. 60 tablet 0  . predniSONE (DELTASONE) 20 MG tablet 40 mg of prednisone per day until you have had 48 hours of stable symptoms. Then resume your regular taper 10 tablet 0  . rosuvastatin (CRESTOR) 10 MG tablet Take  10 mg by mouth at bedtime.    . sertraline (ZOLOFT) 50 MG tablet Take 1 tablet (50 mg total) by mouth at bedtime. 30 tablet 0  . tamsulosin (FLOMAX) 0.4 MG CAPS capsule Take 1 capsule (0.4 mg total) by mouth daily. 30 capsule 0  . tiotropium (SPIRIVA HANDIHALER) 18 MCG inhalation capsule Place 1 capsule (18 mcg total) into inhaler and inhale daily. 30 capsule 0  . benzonatate (TESSALON PERLES) 100 MG capsule Take 1 capsule (100 mg total) by mouth 3 (three) times daily as needed for cough. (Patient not taking: Reported on 04/27/2015) 30 capsule 0  . isosorbide mononitrate (IMDUR) 60 MG 24 hr tablet Take 1 tablet (60 mg total) by mouth daily. HOLD UNTIL YOU FOLLOW-UP WITH PCP (Patient not taking: Reported on 04/27/2015) 90 tablet 1  . mometasone-formoterol  (DULERA) 100-5 MCG/ACT AERO Inhale 2 puffs into the lungs 2 (two) times daily. (Patient not taking: Reported on 04/17/2015) 13 g 0  . pantoprazole sodium (PROTONIX) 40 mg/20 mL PACK Take 20 mLs (40 mg total) by mouth daily. 30 each 1   No current facility-administered medications for this visit.    Functional Status:   In your present state of health, do you have any difficulty performing the following activities: 04/30/2015 04/23/2015  Hearing? Y N  Vision? N N  Difficulty concentrating or making decisions? [name]  Walking or climbing stairs? Y Y  Dressing or bathing? Y Y  Doing errands, shopping? [name]  Preparing Food and eating ? Y -  Using the Toilet? Y -  In the past six months, have you accidently leaked urine? N -  Do you have problems with loss of bowel control? N -  Managing your Medications? N -  Managing your Finances? N -  Housekeeping or managing your Housekeeping? N -    Fall/Depression Screening:    PHQ 2/9 Scores 04/30/2015  PHQ - 2 Score 0    Assessment: Initial transition of care visit done. Present on visit: patient, [name] ([employer] aide), [name] and [name]- care Haynes coordinators. 81 year old male who had several hospital admissions in the last 6 months related to COPD exacerbation. Patient has a caregiver [name]) who is very active and supportive of his care. She manages patient's medications and doctors appointments. Caregiver continues to crush medications for patient related to risk of aspiration.  Patient's medications were reviewed and noted that [name] has obtained Docusate and Fish oil in liquid form. She expressed concern about Protonix, Mucinex and Tessalon Perles previously ordered (do not crush medications). Care manager offered that primary care provider to request for liquid form, however, she states she would call herself to cut down on the calls she is receiving. Encouraged caregiver to manage patient's medications as previously  done.   [employer] (private pay aid agency) continues to provide personal care services as arranged by caregiver. [place] physical therapy and nurse are still active with patient's care.  Patient states that he has plenty of help coming from different directions.  Advance Directive: Patient states, he and [name] are working on Financial controller and he declined [facility]  social work assistance.  Fall risk: Discussed with patient about fall prevention measures. Currently active with home health physical therapy. Patient reports he does not ever get up without any assistance.  Immediately following assessment, care Haynes coordinator called [name] to discuss plan of care per her request. Informed her that COPD packet and [facility] calendar  had been provided and discussed with patient and other caregiver present. She then asked what else can [facility] care management do to help take care of the patient other than what she is already doing. Reinforced [facility] care Haynes services with the goal to help patient/caregiver manage and improve health. Currently, home health services are in place, personal care services are in place, medications are being managed. Per [name], she had requested for a pulmonologist referral and patient now has an appointment scheduled with pulmonologist regarding his COPD. Care manager commended Ms. Snider for her great care done for the patient. Discussed transition of care program but [name] only agreed to receiving EMMI educational materials. She states, " I am sure that there are people who need your services more than we do, and I'm sure that you do a good job, but I think your time and my time is valuable".  Care manager encouraged AlexBaxter Haynes to call care Haynes if there are any further nursing needs or community resource needs.   Plan: Will send EMMI videos to caregiver related to COPD. Will close case and notify primary care provider of case  closure.   Khalil Szczepanik A. Camesha Farooq, BSN, RN-BC [place] Coordinator Cell: [phone]     .

## 2015-05-01 ENCOUNTER — Encounter: Payer: Self-pay | Admitting: *Deleted

## 2015-05-21 ENCOUNTER — Institutional Professional Consult (permissible substitution): Payer: Medicare Other | Admitting: Pulmonary Disease

## 2015-05-23 ENCOUNTER — Encounter (HOSPITAL_COMMUNITY): Payer: Self-pay | Admitting: *Deleted

## 2015-05-23 ENCOUNTER — Inpatient Hospital Stay (HOSPITAL_COMMUNITY)
Admission: EM | Admit: 2015-05-23 | Discharge: 2015-05-26 | DRG: 637 | Disposition: A | Discharge status: Home / Self Care | Payer: Medicare Other | Attending: Internal Medicine | Admitting: Internal Medicine

## 2015-05-23 DIAGNOSIS — Z803 Family history of malignant neoplasm of breast: Secondary | ICD-10-CM

## 2015-05-23 DIAGNOSIS — Z7902 Long term (current) use of antithrombotics/antiplatelets: Secondary | ICD-10-CM

## 2015-05-23 DIAGNOSIS — N182 Chronic kidney disease, stage 2 (mild): Secondary | ICD-10-CM | POA: Diagnosis present

## 2015-05-23 DIAGNOSIS — Z8546 Personal history of malignant neoplasm of prostate: Secondary | ICD-10-CM

## 2015-05-23 DIAGNOSIS — R52 Pain, unspecified: Secondary | ICD-10-CM | POA: Diagnosis not present

## 2015-05-23 DIAGNOSIS — E273 Drug-induced adrenocortical insufficiency: Secondary | ICD-10-CM | POA: Diagnosis present

## 2015-05-23 DIAGNOSIS — R001 Bradycardia, unspecified: Secondary | ICD-10-CM | POA: Diagnosis present

## 2015-05-23 DIAGNOSIS — N390 Urinary tract infection, site not specified: Secondary | ICD-10-CM | POA: Diagnosis present

## 2015-05-23 DIAGNOSIS — R7881 Bacteremia: Secondary | ICD-10-CM

## 2015-05-23 DIAGNOSIS — J449 Chronic obstructive pulmonary disease, unspecified: Secondary | ICD-10-CM | POA: Diagnosis present

## 2015-05-23 DIAGNOSIS — I255 Ischemic cardiomyopathy: Secondary | ICD-10-CM | POA: Diagnosis present

## 2015-05-23 DIAGNOSIS — E119 Type 2 diabetes mellitus without complications: Secondary | ICD-10-CM

## 2015-05-23 DIAGNOSIS — E1310 Other specified diabetes mellitus with ketoacidosis without coma: Secondary | ICD-10-CM | POA: Diagnosis not present

## 2015-05-23 DIAGNOSIS — Z800 Family history of malignant neoplasm of digestive organs: Secondary | ICD-10-CM

## 2015-05-23 DIAGNOSIS — J9600 Acute respiratory failure, unspecified whether with hypoxia or hypercapnia: Secondary | ICD-10-CM | POA: Diagnosis present

## 2015-05-23 DIAGNOSIS — I252 Old myocardial infarction: Secondary | ICD-10-CM

## 2015-05-23 DIAGNOSIS — I2510 Atherosclerotic heart disease of native coronary artery without angina pectoris: Secondary | ICD-10-CM | POA: Diagnosis present

## 2015-05-23 DIAGNOSIS — Z888 Allergy status to other drugs, medicaments and biological substances status: Secondary | ICD-10-CM

## 2015-05-23 DIAGNOSIS — R739 Hyperglycemia, unspecified: Secondary | ICD-10-CM | POA: Diagnosis present

## 2015-05-23 DIAGNOSIS — Z7952 Long term (current) use of systemic steroids: Secondary | ICD-10-CM

## 2015-05-23 DIAGNOSIS — E1165 Type 2 diabetes mellitus with hyperglycemia: Secondary | ICD-10-CM | POA: Diagnosis present

## 2015-05-23 DIAGNOSIS — Z792 Long term (current) use of antibiotics: Secondary | ICD-10-CM

## 2015-05-23 DIAGNOSIS — R338 Other retention of urine: Secondary | ICD-10-CM

## 2015-05-23 DIAGNOSIS — Z79899 Other long term (current) drug therapy: Secondary | ICD-10-CM

## 2015-05-23 DIAGNOSIS — J189 Pneumonia, unspecified organism: Secondary | ICD-10-CM

## 2015-05-23 DIAGNOSIS — I959 Hypotension, unspecified: Secondary | ICD-10-CM | POA: Diagnosis present

## 2015-05-23 DIAGNOSIS — I129 Hypertensive chronic kidney disease with stage 1 through stage 4 chronic kidney disease, or unspecified chronic kidney disease: Secondary | ICD-10-CM | POA: Diagnosis present

## 2015-05-23 DIAGNOSIS — F0390 Unspecified dementia without behavioral disturbance: Secondary | ICD-10-CM | POA: Diagnosis present

## 2015-05-23 DIAGNOSIS — Z7982 Long term (current) use of aspirin: Secondary | ICD-10-CM

## 2015-05-23 DIAGNOSIS — Z825 Family history of asthma and other chronic lower respiratory diseases: Secondary | ICD-10-CM

## 2015-05-23 DIAGNOSIS — IMO0002 Reserved for concepts with insufficient information to code with codable children: Secondary | ICD-10-CM | POA: Diagnosis present

## 2015-05-23 DIAGNOSIS — I10 Essential (primary) hypertension: Secondary | ICD-10-CM | POA: Diagnosis present

## 2015-05-23 DIAGNOSIS — Z87440 Personal history of urinary (tract) infections: Secondary | ICD-10-CM

## 2015-05-23 DIAGNOSIS — J8410 Pulmonary fibrosis, unspecified: Secondary | ICD-10-CM | POA: Diagnosis present

## 2015-05-23 DIAGNOSIS — L89152 Pressure ulcer of sacral region, stage 2: Secondary | ICD-10-CM | POA: Diagnosis present

## 2015-05-23 DIAGNOSIS — I5032 Chronic diastolic (congestive) heart failure: Secondary | ICD-10-CM | POA: Diagnosis present

## 2015-05-23 DIAGNOSIS — T380X5A Adverse effect of glucocorticoids and synthetic analogues, initial encounter: Secondary | ICD-10-CM | POA: Diagnosis present

## 2015-05-23 DIAGNOSIS — Z66 Do not resuscitate: Secondary | ICD-10-CM | POA: Diagnosis present

## 2015-05-23 DIAGNOSIS — K219 Gastro-esophageal reflux disease without esophagitis: Secondary | ICD-10-CM | POA: Diagnosis present

## 2015-05-23 DIAGNOSIS — Z8249 Family history of ischemic heart disease and other diseases of the circulatory system: Secondary | ICD-10-CM

## 2015-05-23 DIAGNOSIS — M1990 Unspecified osteoarthritis, unspecified site: Secondary | ICD-10-CM | POA: Diagnosis present

## 2015-05-23 DIAGNOSIS — B399 Histoplasmosis, unspecified: Secondary | ICD-10-CM | POA: Diagnosis present

## 2015-05-23 DIAGNOSIS — J441 Chronic obstructive pulmonary disease with (acute) exacerbation: Secondary | ICD-10-CM | POA: Diagnosis present

## 2015-05-23 DIAGNOSIS — E785 Hyperlipidemia, unspecified: Secondary | ICD-10-CM | POA: Diagnosis present

## 2015-05-23 DIAGNOSIS — Z85828 Personal history of other malignant neoplasm of skin: Secondary | ICD-10-CM

## 2015-05-23 DIAGNOSIS — B9620 Unspecified Escherichia coli [E. coli] as the cause of diseases classified elsewhere: Secondary | ICD-10-CM | POA: Diagnosis present

## 2015-05-23 NOTE — ED Notes (Signed)
Pt resides at home with his daughter, he is here tonight due to buttock pain due to bedsores on butt.  This has been painful for three weeks

## 2015-05-23 NOTE — ED Notes (Signed)
Bed: FT95 Expected date:  Expected time:  Means of arrival:  Comments: EMS 63M R buttocks pain x3 weeks

## 2015-05-24 ENCOUNTER — Emergency Department (HOSPITAL_COMMUNITY): Payer: Medicare Other

## 2015-05-24 ENCOUNTER — Inpatient Hospital Stay (HOSPITAL_COMMUNITY): Payer: Medicare Other

## 2015-05-24 DIAGNOSIS — Z79899 Other long term (current) drug therapy: Secondary | ICD-10-CM | POA: Diagnosis not present

## 2015-05-24 DIAGNOSIS — I10 Essential (primary) hypertension: Secondary | ICD-10-CM | POA: Diagnosis not present

## 2015-05-24 DIAGNOSIS — Z7902 Long term (current) use of antithrombotics/antiplatelets: Secondary | ICD-10-CM | POA: Diagnosis not present

## 2015-05-24 DIAGNOSIS — J449 Chronic obstructive pulmonary disease, unspecified: Secondary | ICD-10-CM | POA: Diagnosis present

## 2015-05-24 DIAGNOSIS — J441 Chronic obstructive pulmonary disease with (acute) exacerbation: Secondary | ICD-10-CM | POA: Diagnosis present

## 2015-05-24 DIAGNOSIS — Z825 Family history of asthma and other chronic lower respiratory diseases: Secondary | ICD-10-CM | POA: Diagnosis not present

## 2015-05-24 DIAGNOSIS — R7881 Bacteremia: Secondary | ICD-10-CM | POA: Diagnosis not present

## 2015-05-24 DIAGNOSIS — I959 Hypotension, unspecified: Secondary | ICD-10-CM | POA: Diagnosis present

## 2015-05-24 DIAGNOSIS — R739 Hyperglycemia, unspecified: Secondary | ICD-10-CM

## 2015-05-24 DIAGNOSIS — I129 Hypertensive chronic kidney disease with stage 1 through stage 4 chronic kidney disease, or unspecified chronic kidney disease: Secondary | ICD-10-CM | POA: Diagnosis present

## 2015-05-24 DIAGNOSIS — Z8249 Family history of ischemic heart disease and other diseases of the circulatory system: Secondary | ICD-10-CM | POA: Diagnosis not present

## 2015-05-24 DIAGNOSIS — I2510 Atherosclerotic heart disease of native coronary artery without angina pectoris: Secondary | ICD-10-CM | POA: Diagnosis present

## 2015-05-24 DIAGNOSIS — Z85828 Personal history of other malignant neoplasm of skin: Secondary | ICD-10-CM | POA: Diagnosis not present

## 2015-05-24 DIAGNOSIS — I5032 Chronic diastolic (congestive) heart failure: Secondary | ICD-10-CM | POA: Diagnosis not present

## 2015-05-24 DIAGNOSIS — N390 Urinary tract infection, site not specified: Secondary | ICD-10-CM

## 2015-05-24 DIAGNOSIS — L89152 Pressure ulcer of sacral region, stage 2: Secondary | ICD-10-CM | POA: Diagnosis present

## 2015-05-24 DIAGNOSIS — Z87440 Personal history of urinary (tract) infections: Secondary | ICD-10-CM | POA: Diagnosis not present

## 2015-05-24 DIAGNOSIS — J9600 Acute respiratory failure, unspecified whether with hypoxia or hypercapnia: Secondary | ICD-10-CM | POA: Diagnosis present

## 2015-05-24 DIAGNOSIS — Z800 Family history of malignant neoplasm of digestive organs: Secondary | ICD-10-CM | POA: Diagnosis not present

## 2015-05-24 DIAGNOSIS — I255 Ischemic cardiomyopathy: Secondary | ICD-10-CM | POA: Diagnosis present

## 2015-05-24 DIAGNOSIS — E273 Drug-induced adrenocortical insufficiency: Secondary | ICD-10-CM | POA: Diagnosis present

## 2015-05-24 DIAGNOSIS — N182 Chronic kidney disease, stage 2 (mild): Secondary | ICD-10-CM | POA: Diagnosis not present

## 2015-05-24 DIAGNOSIS — Z7982 Long term (current) use of aspirin: Secondary | ICD-10-CM | POA: Diagnosis not present

## 2015-05-24 DIAGNOSIS — Z888 Allergy status to other drugs, medicaments and biological substances status: Secondary | ICD-10-CM | POA: Diagnosis not present

## 2015-05-24 DIAGNOSIS — B399 Histoplasmosis, unspecified: Secondary | ICD-10-CM | POA: Diagnosis present

## 2015-05-24 DIAGNOSIS — R001 Bradycardia, unspecified: Secondary | ICD-10-CM | POA: Diagnosis present

## 2015-05-24 DIAGNOSIS — R338 Other retention of urine: Secondary | ICD-10-CM | POA: Diagnosis not present

## 2015-05-24 DIAGNOSIS — J8410 Pulmonary fibrosis, unspecified: Secondary | ICD-10-CM | POA: Diagnosis present

## 2015-05-24 DIAGNOSIS — E1310 Other specified diabetes mellitus with ketoacidosis without coma: Secondary | ICD-10-CM | POA: Diagnosis present

## 2015-05-24 DIAGNOSIS — Z8546 Personal history of malignant neoplasm of prostate: Secondary | ICD-10-CM | POA: Diagnosis not present

## 2015-05-24 DIAGNOSIS — M1990 Unspecified osteoarthritis, unspecified site: Secondary | ICD-10-CM | POA: Diagnosis present

## 2015-05-24 DIAGNOSIS — Z66 Do not resuscitate: Secondary | ICD-10-CM | POA: Diagnosis present

## 2015-05-24 DIAGNOSIS — Z803 Family history of malignant neoplasm of breast: Secondary | ICD-10-CM | POA: Diagnosis not present

## 2015-05-24 DIAGNOSIS — Z792 Long term (current) use of antibiotics: Secondary | ICD-10-CM | POA: Diagnosis not present

## 2015-05-24 DIAGNOSIS — B9620 Unspecified Escherichia coli [E. coli] as the cause of diseases classified elsewhere: Secondary | ICD-10-CM | POA: Diagnosis present

## 2015-05-24 DIAGNOSIS — R52 Pain, unspecified: Secondary | ICD-10-CM | POA: Diagnosis present

## 2015-05-24 DIAGNOSIS — I252 Old myocardial infarction: Secondary | ICD-10-CM | POA: Diagnosis not present

## 2015-05-24 DIAGNOSIS — K219 Gastro-esophageal reflux disease without esophagitis: Secondary | ICD-10-CM | POA: Diagnosis present

## 2015-05-24 DIAGNOSIS — Z7952 Long term (current) use of systemic steroids: Secondary | ICD-10-CM | POA: Diagnosis not present

## 2015-05-24 DIAGNOSIS — E785 Hyperlipidemia, unspecified: Secondary | ICD-10-CM | POA: Diagnosis present

## 2015-05-24 DIAGNOSIS — F0390 Unspecified dementia without behavioral disturbance: Secondary | ICD-10-CM | POA: Diagnosis present

## 2015-05-24 DIAGNOSIS — T380X5A Adverse effect of glucocorticoids and synthetic analogues, initial encounter: Secondary | ICD-10-CM | POA: Diagnosis present

## 2015-05-24 LAB — COMPREHENSIVE METABOLIC PANEL
ALK PHOS: 55 U/L (ref 38–126)
ALT: 16 U/L — AB (ref 17–63)
ANION GAP: 7 (ref 5–15)
AST: 19 U/L (ref 15–41)
Albumin: 2.9 g/dL — ABNORMAL LOW (ref 3.5–5.0)
BILIRUBIN TOTAL: 0.5 mg/dL (ref 0.3–1.2)
BUN: 46 mg/dL — AB (ref 6–20)
CHLORIDE: 104 mmol/L (ref 101–111)
CO2: 24 mmol/L (ref 22–32)
Calcium: 8.2 mg/dL — ABNORMAL LOW (ref 8.9–10.3)
Creatinine, Ser: 1.35 mg/dL — ABNORMAL HIGH (ref 0.61–1.24)
GFR calc non Af Amer: 47 mL/min — ABNORMAL LOW (ref >60)
GFR, EST AFRICAN AMERICAN: 54 mL/min — AB (ref >60)
GLUCOSE: 533 mg/dL — AB (ref 65–99)
Potassium: 4.5 mmol/L (ref 3.5–5.1)
SODIUM: 135 mmol/L (ref 135–145)
TOTAL PROTEIN: 5.4 g/dL — AB (ref 6.5–8.1)

## 2015-05-24 LAB — I-STAT CG4 LACTIC ACID, ED
LACTIC ACID, VENOUS: 6.26 mmol/L — AB (ref 0.5–2.0)
Lactic Acid, Venous: 1.2 mmol/L (ref 0.5–2.0)

## 2015-05-24 LAB — CBC WITH DIFFERENTIAL/PLATELET
Basophils Absolute: 0 10*3/uL (ref 0.0–0.1)
Basophils Relative: 0 % (ref 0–1)
EOS ABS: 0 10*3/uL (ref 0.0–0.7)
EOS PCT: 0 % (ref 0–5)
HCT: 26.4 % — ABNORMAL LOW (ref 39.0–52.0)
Hemoglobin: 9.1 g/dL — ABNORMAL LOW (ref 13.0–17.0)
Lymphocytes Relative: 4 % — ABNORMAL LOW (ref 12–46)
Lymphs Abs: 0.2 10*3/uL — ABNORMAL LOW (ref 0.7–4.0)
MCH: 30.6 pg (ref 26.0–34.0)
MCHC: 34.5 g/dL (ref 30.0–36.0)
MCV: 88.9 fL (ref 78.0–100.0)
MONOS PCT: 8 % (ref 3–12)
Monocytes Absolute: 0.4 10*3/uL (ref 0.1–1.0)
Neutro Abs: 4.4 10*3/uL (ref 1.7–7.7)
Neutrophils Relative %: 88 % — ABNORMAL HIGH (ref 43–77)
PLATELETS: 126 10*3/uL — AB (ref 150–400)
RBC: 2.97 MIL/uL — ABNORMAL LOW (ref 4.22–5.81)
RDW: 15.4 % (ref 11.5–15.5)
WBC: 5 10*3/uL (ref 4.0–10.5)

## 2015-05-24 LAB — CBC
HCT: 30 % — ABNORMAL LOW (ref 39.0–52.0)
HEMOGLOBIN: 10.3 g/dL — AB (ref 13.0–17.0)
MCH: 30.6 pg (ref 26.0–34.0)
MCHC: 34.3 g/dL (ref 30.0–36.0)
MCV: 89 fL (ref 78.0–100.0)
PLATELETS: 133 10*3/uL — AB (ref 150–400)
RBC: 3.37 MIL/uL — AB (ref 4.22–5.81)
RDW: 15.6 % — ABNORMAL HIGH (ref 11.5–15.5)
WBC: 4.9 10*3/uL (ref 4.0–10.5)

## 2015-05-24 LAB — URINALYSIS, ROUTINE W REFLEX MICROSCOPIC
Bilirubin Urine: NEGATIVE
Ketones, ur: NEGATIVE mg/dL
Nitrite: NEGATIVE
PH: 5 (ref 5.0–8.0)
Protein, ur: NEGATIVE mg/dL
Specific Gravity, Urine: 1.025 (ref 1.005–1.030)
UROBILINOGEN UA: 0.2 mg/dL (ref 0.0–1.0)

## 2015-05-24 LAB — BASIC METABOLIC PANEL
Anion gap: 9 (ref 5–15)
Anion gap: 9 (ref 5–15)
BUN: 43 mg/dL — AB (ref 6–20)
BUN: 40 mg/dL — ABNORMAL HIGH (ref 6–20)
CALCIUM: 8.7 mg/dL — AB (ref 8.9–10.3)
CHLORIDE: 111 mmol/L (ref 101–111)
CO2: 26 mmol/L (ref 22–32)
CO2: 23 mmol/L (ref 22–32)
Calcium: 8.5 mg/dL — ABNORMAL LOW (ref 8.9–10.3)
Chloride: 104 mmol/L (ref 101–111)
Creatinine, Ser: 1.26 mg/dL — ABNORMAL HIGH (ref 0.61–1.24)
Creatinine, Ser: 1 mg/dL (ref 0.61–1.24)
GFR calc Af Amer: 59 mL/min — ABNORMAL LOW (ref >60)
GFR calc Af Amer: >60 mL/min (ref >60)
GFR calc non Af Amer: >60 mL/min (ref >60)
GFR, EST NON AFRICAN AMERICAN: 51 mL/min — AB (ref >60)
GLUCOSE: 463 mg/dL — AB (ref 65–99)
Glucose, Bld: 93 mg/dL (ref 65–99)
POTASSIUM: 4.2 mmol/L (ref 3.5–5.1)
Potassium: 4.8 mmol/L (ref 3.5–5.1)
SODIUM: 139 mmol/L (ref 135–145)
SODIUM: 143 mmol/L (ref 135–145)

## 2015-05-24 LAB — GLUCOSE, CAPILLARY
GLUCOSE-CAPILLARY: 410 mg/dL — AB (ref 65–99)
GLUCOSE-CAPILLARY: 338 mg/dL — AB (ref 65–99)
GLUCOSE-CAPILLARY: 202 mg/dL — AB (ref 65–99)
Glucose-Capillary: 306 mg/dL — ABNORMAL HIGH (ref 65–99)
Glucose-Capillary: 305 mg/dL — ABNORMAL HIGH (ref 65–99)
Glucose-Capillary: 120 mg/dL — ABNORMAL HIGH (ref 65–99)
Glucose-Capillary: 84 mg/dL (ref 65–99)
Glucose-Capillary: 88 mg/dL (ref 65–99)
Glucose-Capillary: 100 mg/dL — ABNORMAL HIGH (ref 65–99)
Glucose-Capillary: 193 mg/dL — ABNORMAL HIGH (ref 65–99)

## 2015-05-24 LAB — URINE MICROSCOPIC-ADD ON

## 2015-05-24 LAB — I-STAT CHEM 8, ED
BUN: 36 mg/dL — AB (ref 6–20)
BUN: 39 mg/dL — ABNORMAL HIGH (ref 6–20)
CREATININE: 0.9 mg/dL (ref 0.61–1.24)
CREATININE: 1.1 mg/dL (ref 0.61–1.24)
Calcium, Ion: 1 mmol/L — ABNORMAL LOW (ref 1.13–1.30)
Calcium, Ion: 1.18 mmol/L (ref 1.13–1.30)
Chloride: 104 mmol/L (ref 101–111)
Chloride: 101 mmol/L (ref 101–111)
Glucose, Bld: 670 mg/dL (ref 65–99)
Glucose, Bld: 520 mg/dL — ABNORMAL HIGH (ref 65–99)
HCT: 23 % — ABNORMAL LOW (ref 39.0–52.0)
HCT: 26 % — ABNORMAL LOW (ref 39.0–52.0)
Hemoglobin: 7.8 g/dL — ABNORMAL LOW (ref 13.0–17.0)
Hemoglobin: 8.8 g/dL — ABNORMAL LOW (ref 13.0–17.0)
Potassium: 3.6 mmol/L (ref 3.5–5.1)
Potassium: 4.5 mmol/L (ref 3.5–5.1)
Sodium: 138 mmol/L (ref 135–145)
Sodium: 138 mmol/L (ref 135–145)
TCO2: 15 mmol/L (ref 0–100)
TCO2: 21 mmol/L (ref 0–100)

## 2015-05-24 LAB — MRSA PCR SCREENING: MRSA BY PCR: NEGATIVE

## 2015-05-24 LAB — PROTIME-INR
INR: 1.17 (ref 0.00–1.49)
Prothrombin Time: 15.1 seconds (ref 11.6–15.2)

## 2015-05-24 LAB — MAGNESIUM: Magnesium: 2.1 mg/dL (ref 1.7–2.4)

## 2015-05-24 LAB — CBG MONITORING, ED: Glucose-Capillary: 484 mg/dL — ABNORMAL HIGH (ref 65–99)

## 2015-05-24 MED ORDER — CARVEDILOL 3.125 MG PO TABS
3.1250 mg | ORAL_TABLET | Freq: Two times a day (BID) | ORAL | Status: DC
  Filled 2015-05-24: qty 1

## 2015-05-24 MED ORDER — MORPHINE SULFATE (CONCENTRATE) 10 MG/0.5ML PO SOLN: 5.0000 mg | ORAL | Status: DC | PRN

## 2015-05-24 MED ORDER — CLOPIDOGREL BISULFATE 75 MG PO TABS
75.0000 mg | ORAL_TABLET | Freq: Every day | ORAL | Status: DC
  Administered 2015-05-24 – 2015-05-26 (×3): 75 mg via ORAL
  Filled 2015-05-24 (×4): qty 1

## 2015-05-24 MED ORDER — ENOXAPARIN SODIUM 40 MG/0.4ML ~~LOC~~ SOLN
40.0000 mg | SUBCUTANEOUS | Status: DC
  Administered 2015-05-24 – 2015-05-26 (×3): 40 mg via SUBCUTANEOUS
  Filled 2015-05-24 (×3): qty 0.4

## 2015-05-24 MED ORDER — PREDNISONE 20 MG PO TABS
30.0000 mg | ORAL_TABLET | Freq: Every day | ORAL | Status: DC
  Administered 2015-05-25 – 2015-05-26 (×2): 30 mg via ORAL
  Filled 2015-05-24 (×3): qty 1

## 2015-05-24 MED ORDER — POTASSIUM CHLORIDE 10 MEQ/100ML IV SOLN: 10.0000 meq | INTRAVENOUS | Status: DC

## 2015-05-24 MED ORDER — SODIUM CHLORIDE 0.9 % IV BOLUS (SEPSIS)
1000.0000 mL | Freq: Once | INTRAVENOUS | Status: AC
  Administered 2015-05-24: 1000 mL via INTRAVENOUS

## 2015-05-24 MED ORDER — PANTOPRAZOLE SODIUM 40 MG PO PACK
40.0000 mg | PACK | Freq: Every day | ORAL | Status: DC
  Administered 2015-05-24 – 2015-05-26 (×3): 40 mg via ORAL
  Filled 2015-05-24 (×4): qty 20

## 2015-05-24 MED ORDER — GLUCERNA SHAKE PO LIQD
237.0000 mL | Freq: Three times a day (TID) | ORAL | Status: DC
  Administered 2015-05-24 – 2015-05-26 (×4): 237 mL via ORAL
  Filled 2015-05-24 (×7): qty 237

## 2015-05-24 MED ORDER — POTASSIUM CHLORIDE 10 MEQ/100ML IV SOLN
10.0000 meq | INTRAVENOUS | Status: DC
  Filled 2015-05-24: qty 100

## 2015-05-24 MED ORDER — TIOTROPIUM BROMIDE MONOHYDRATE 18 MCG IN CAPS
18.0000 ug | ORAL_CAPSULE | Freq: Every day | RESPIRATORY_TRACT | Status: DC
  Administered 2015-05-24 – 2015-05-26 (×3): 18 ug via RESPIRATORY_TRACT
  Filled 2015-05-24 (×2): qty 5

## 2015-05-24 MED ORDER — DEXTROSE-NACL 5-0.45 % IV SOLN
INTRAVENOUS | Status: DC
  Administered 2015-05-24: 09:00:00 via INTRAVENOUS

## 2015-05-24 MED ORDER — IPRATROPIUM-ALBUTEROL 0.5-2.5 (3) MG/3ML IN SOLN: 3.0000 mL | Freq: Four times a day (QID) | RESPIRATORY_TRACT | Status: DC | PRN

## 2015-05-24 MED ORDER — METFORMIN HCL 500 MG PO TABS
500.0000 mg | ORAL_TABLET | Freq: Two times a day (BID) | ORAL | Status: DC
  Administered 2015-05-24 – 2015-05-26 (×5): 500 mg via ORAL
  Filled 2015-05-24 (×6): qty 1

## 2015-05-24 MED ORDER — LIP MEDEX EX OINT
TOPICAL_OINTMENT | CUTANEOUS | Status: AC
  Administered 2015-05-24: 08:00:00
  Filled 2015-05-24: qty 7

## 2015-05-24 MED ORDER — IOHEXOL 300 MG/ML  SOLN
100.0000 mL | Freq: Once | INTRAMUSCULAR | Status: AC | PRN
  Administered 2015-05-24: 100 mL via INTRAVENOUS

## 2015-05-24 MED ORDER — ALBUTEROL SULFATE (2.5 MG/3ML) 0.083% IN NEBU: 2.5000 mg | INHALATION_SOLUTION | RESPIRATORY_TRACT | Status: DC | PRN

## 2015-05-24 MED ORDER — SODIUM CHLORIDE 0.9 % IV SOLN
INTRAVENOUS | Status: DC
  Administered 2015-05-24: 04:00:00 via INTRAVENOUS

## 2015-05-24 MED ORDER — ASPIRIN 81 MG PO CHEW
81.0000 mg | CHEWABLE_TABLET | Freq: Every day | ORAL | Status: DC
  Administered 2015-05-24 – 2015-05-26 (×3): 81 mg via ORAL
  Filled 2015-05-24 (×3): qty 1

## 2015-05-24 MED ORDER — ONDANSETRON HCL 4 MG PO TABS: 4.0000 mg | ORAL_TABLET | Freq: Four times a day (QID) | ORAL | Status: DC | PRN

## 2015-05-24 MED ORDER — INSULIN REGULAR BOLUS VIA INFUSION
0.0000 [IU] | Freq: Three times a day (TID) | INTRAVENOUS | Status: DC
  Filled 2015-05-24: qty 10

## 2015-05-24 MED ORDER — SODIUM CHLORIDE 0.9 % IV SOLN: INTRAVENOUS | Status: DC

## 2015-05-24 MED ORDER — INSULIN ASPART 100 UNIT/ML IV SOLN
10.0000 [IU] | Freq: Once | INTRAVENOUS | Status: AC
  Administered 2015-05-24: 10 [IU] via INTRAVENOUS
  Filled 2015-05-24: qty 0.1

## 2015-05-24 MED ORDER — ROSUVASTATIN CALCIUM 10 MG PO TABS: 10.0000 mg | ORAL_TABLET | Freq: Every day | ORAL | Status: DC

## 2015-05-24 MED ORDER — MORPHINE SULFATE 4 MG/ML IJ SOLN
6.0000 mg | Freq: Once | INTRAMUSCULAR | Status: AC
  Administered 2015-05-24: 6 mg via INTRAVENOUS
  Filled 2015-05-24: qty 2

## 2015-05-24 MED ORDER — PREDNISONE 10 MG PO TABS: 10.0000 mg | ORAL_TABLET | Freq: Every day | ORAL | Status: DC

## 2015-05-24 MED ORDER — DEXTROSE 5 % IV SOLN
1.0000 g | INTRAVENOUS | Status: DC
  Administered 2015-05-24 – 2015-05-26 (×3): 1 g via INTRAVENOUS
  Filled 2015-05-24 (×3): qty 10

## 2015-05-24 MED ORDER — MORPHINE SULFATE (CONCENTRATE) 10 MG/0.5ML PO SOLN
5.0000 mg | ORAL | Status: DC | PRN
  Administered 2015-05-25 – 2015-05-26 (×2): 5 mg via ORAL
  Filled 2015-05-24 (×2): qty 0.5

## 2015-05-24 MED ORDER — ONDANSETRON HCL 4 MG/2ML IJ SOLN: 4.0000 mg | Freq: Four times a day (QID) | INTRAMUSCULAR | Status: DC | PRN

## 2015-05-24 MED ORDER — INSULIN ASPART 100 UNIT/ML ~~LOC~~ SOLN
0.0000 [IU] | Freq: Three times a day (TID) | SUBCUTANEOUS | Status: DC
  Administered 2015-05-24 – 2015-05-25 (×2): 3 [IU] via SUBCUTANEOUS
  Administered 2015-05-25 – 2015-05-26 (×2): 5 [IU] via SUBCUTANEOUS
  Administered 2015-05-26: 2 [IU] via SUBCUTANEOUS
  Administered 2015-05-26: 3 [IU] via SUBCUTANEOUS

## 2015-05-24 MED ORDER — SERTRALINE HCL 50 MG PO TABS
50.0000 mg | ORAL_TABLET | Freq: Every day | ORAL | Status: DC
  Administered 2015-05-24 – 2015-05-25 (×2): 50 mg via ORAL
  Filled 2015-05-24 (×4): qty 1

## 2015-05-24 MED ORDER — INSULIN REGULAR HUMAN 100 UNIT/ML IJ SOLN
INTRAMUSCULAR | Status: DC
  Administered 2015-05-24: 3.5 [IU]/h via INTRAVENOUS
  Filled 2015-05-24: qty 2.5

## 2015-05-24 MED ORDER — ISOSORBIDE MONONITRATE ER 60 MG PO TB24: 60.0000 mg | ORAL_TABLET | Freq: Every day | ORAL | Status: DC

## 2015-05-24 MED ORDER — POTASSIUM CHLORIDE CRYS ER 20 MEQ PO TBCR
60.0000 meq | EXTENDED_RELEASE_TABLET | Freq: Once | ORAL | Status: AC
  Administered 2015-05-24: 60 meq via ORAL
  Filled 2015-05-24: qty 3

## 2015-05-24 MED ORDER — SODIUM CHLORIDE 0.9 % IJ SOLN
3.0000 mL | Freq: Two times a day (BID) | INTRAMUSCULAR | Status: DC
  Administered 2015-05-24 – 2015-05-26 (×5): 3 mL via INTRAVENOUS

## 2015-05-24 MED ORDER — DEXTROSE 50 % IV SOLN: 25.0000 mL | INTRAVENOUS | Status: DC | PRN

## 2015-05-24 MED ORDER — INSULIN DETEMIR 100 UNIT/ML ~~LOC~~ SOLN
10.0000 [IU] | Freq: Every day | SUBCUTANEOUS | Status: DC
  Administered 2015-05-24: 10 [IU] via SUBCUTANEOUS
  Filled 2015-05-24: qty 0.1

## 2015-05-24 MED ORDER — ALUM & MAG HYDROXIDE-SIMETH 200-200-20 MG/5ML PO SUSP: 30.0000 mL | Freq: Four times a day (QID) | ORAL | Status: DC | PRN

## 2015-05-24 NOTE — Progress Notes (Signed)
Encouraged patient to get out of bed into chair this AM with help of staff. Patient refused and said he does not get out of bed at home and does not want to here. Patient was educated on importance of getting out of bed if even for a little bit, patient still refused getting out of bed into chair. Will offer again later today.
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
## 2015-05-24 NOTE — Progress Notes (Signed)
Utilization review completed.  
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
## 2015-05-25 DIAGNOSIS — I10 Essential (primary) hypertension: Secondary | ICD-10-CM

## 2015-05-25 DIAGNOSIS — N182 Chronic kidney disease, stage 2 (mild): Secondary | ICD-10-CM

## 2015-05-25 DIAGNOSIS — I5032 Chronic diastolic (congestive) heart failure: Secondary | ICD-10-CM

## 2015-05-25 LAB — BASIC METABOLIC PANEL
ANION GAP: 7 (ref 5–15)
BUN: 36 mg/dL — AB (ref 6–20)
CALCIUM: 8.6 mg/dL — AB (ref 8.9–10.3)
CHLORIDE: 107 mmol/L (ref 101–111)
CO2: 26 mmol/L (ref 22–32)
Creatinine, Ser: 1.03 mg/dL (ref 0.61–1.24)
GFR calc Af Amer: >60 mL/min (ref >60)
GFR calc non Af Amer: >60 mL/min (ref >60)
Glucose, Bld: 110 mg/dL — ABNORMAL HIGH (ref 65–99)
POTASSIUM: 4.1 mmol/L (ref 3.5–5.1)
Sodium: 140 mmol/L (ref 135–145)

## 2015-05-25 LAB — GLUCOSE, CAPILLARY
GLUCOSE-CAPILLARY: 209 mg/dL — AB (ref 65–99)
GLUCOSE-CAPILLARY: 300 mg/dL — AB (ref 65–99)
Glucose-Capillary: 101 mg/dL — ABNORMAL HIGH (ref 65–99)
Glucose-Capillary: 223 mg/dL — ABNORMAL HIGH (ref 65–99)

## 2015-05-25 MED ORDER — TAMSULOSIN HCL 0.4 MG PO CAPS
0.4000 mg | ORAL_CAPSULE | Freq: Every day | ORAL | Status: DC
  Administered 2015-05-25 – 2015-05-26 (×2): 0.4 mg via ORAL
  Filled 2015-05-25 (×2): qty 1

## 2015-05-25 MED ORDER — ACETAMINOPHEN 325 MG PO TABS
650.0000 mg | ORAL_TABLET | Freq: Four times a day (QID) | ORAL | Status: DC | PRN
  Administered 2015-05-25: 650 mg via ORAL
  Filled 2015-05-25: qty 2

## 2015-05-25 NOTE — Progress Notes (Signed)
Foley inserted with 600 cc's of turbid yellow urine.  There was small amount of blood on urethral meatus after insertion, but none in urine.

## 2015-05-25 NOTE — Consult Note (Addendum)
WOC wound consult note Reason for Consult: Consult requested for buttocks wounds.  Pt's caregiver at bedside states they have been present "for awhile." Wound type: Stage 2 near sacrum 1X1X.1cm, dry red woundbed, no odor or drainage.  Deep tissue injury located nearby .5X.5cm, dark purplish red. Surrounding skin with patchy areas of red macerated skin and partial thickness skin loss, appearance consistent with moisture associated skin damage.  Condom cath has fallen off and patient is incontinent of large amt urine. Pressure Ulcer POA: Yes Dressing procedure/placement/frequency: Foam dressing to absorb drainage and promote healing.  Discussed plan of care with patient and he verbalized understanding. Bedside nurse plans to reapply condom cath. Please re-consult if further assistance is needed.  Thank-you,  Julien Girt MSN, [place], [facility], [place], [place]
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
## 2015-05-26 DIAGNOSIS — R338 Other retention of urine: Secondary | ICD-10-CM

## 2015-05-26 DIAGNOSIS — R7881 Bacteremia: Secondary | ICD-10-CM

## 2015-05-26 DIAGNOSIS — J438 Other emphysema: Secondary | ICD-10-CM

## 2015-05-26 LAB — BASIC METABOLIC PANEL
Anion gap: 9 (ref 5–15)
BUN: 30 mg/dL — AB (ref 6–20)
CALCIUM: 8.6 mg/dL — AB (ref 8.9–10.3)
CHLORIDE: 106 mmol/L (ref 101–111)
CO2: 21 mmol/L — AB (ref 22–32)
Creatinine, Ser: 0.81 mg/dL (ref 0.61–1.24)
GFR calc Af Amer: >60 mL/min (ref >60)
GFR calc non Af Amer: >60 mL/min (ref >60)
GLUCOSE: 168 mg/dL — AB (ref 65–99)
POTASSIUM: 4.8 mmol/L (ref 3.5–5.1)
SODIUM: 136 mmol/L (ref 135–145)

## 2015-05-26 LAB — CULTURE, BLOOD (ROUTINE X 2)

## 2015-05-26 LAB — HEMOGLOBIN A1C
Hgb A1c MFr Bld: 9.7 % — ABNORMAL HIGH (ref 4.8–5.6)
Mean Plasma Glucose: 232 mg/dL

## 2015-05-26 LAB — GLUCOSE, CAPILLARY
GLUCOSE-CAPILLARY: 178 mg/dL — AB (ref 65–99)
GLUCOSE-CAPILLARY: 267 mg/dL — AB (ref 65–99)
Glucose-Capillary: 243 mg/dL — ABNORMAL HIGH (ref 65–99)

## 2015-05-26 MED ORDER — MORPHINE SULFATE (CONCENTRATE) 10 MG/0.5ML PO SOLN: 5.0000 mg | ORAL | Status: DC | PRN

## 2015-05-26 MED ORDER — METFORMIN HCL 500 MG PO TABS: 250.0000 mg | ORAL_TABLET | Freq: Two times a day (BID) | ORAL | Status: DC

## 2015-05-26 MED ORDER — PREDNISONE 20 MG PO TABS: 30.0000 mg | ORAL_TABLET | Freq: Every day | ORAL | Status: DC

## 2015-05-26 MED ORDER — PANTOPRAZOLE SODIUM 40 MG PO PACK: 20.0000 mg | PACK | Freq: Every day | ORAL | Status: DC

## 2015-05-26 MED ORDER — CEPHALEXIN 500 MG PO CAPS: 500.0000 mg | ORAL_CAPSULE | Freq: Two times a day (BID) | ORAL | Status: DC

## 2015-05-26 MED ORDER — SACCHAROMYCES BOULARDII 250 MG PO CAPS: 250.0000 mg | ORAL_CAPSULE | Freq: Two times a day (BID) | ORAL | Status: AC

## 2015-05-26 MED ORDER — GLUCERNA SHAKE PO LIQD: 237.0000 mL | Freq: Three times a day (TID) | ORAL | Status: DC

## 2015-05-26 NOTE — Discharge Summary (Addendum)
Physician Discharge Summary  [name] HEN:[id number] DOB: 08-19-1929 DOA: 05/23/2015  PCP: Marylynn Pearson, MD  Admit date: 05/23/2015 Discharge date:  pending  Recommendations for Outpatient Follow-up:  1. Discharge to home with ongoing private home health aid and caregiver support 2. F/u results of pending blood cultures 3. F/u with PCP regarding ongoing management of chronic steroids and diabetes  4. Foam dressing to absorb drainage and promote healing 5. Continue keflex through 8/2, then stop  Discharge Diagnoses:  Principal Problem:   E coli bacteremia Active Problems:   Diabetes mellitus type 2, uncontrolled   Essential hypertension   G E R D   CKD (chronic kidney disease), stage II   Coronary atherosclerosis of native coronary artery   COPD (chronic obstructive pulmonary disease)   Chronic diastolic CHF (congestive heart failure)   UTI (lower urinary tract infection)   Hyperglycemia   Acute urinary retention   Discharge Condition: stable, improved  Diet recommendation: diabetic diet  Wt Readings from Last 3 Encounters:  05/24/15 57.9 kg (127 lb 10.3 oz)  04/26/15 58.2 kg (128 lb 4.9 oz)  04/19/15 61.7 kg (136 lb 0.4 oz)    History of present illness:  [name] is a 81 y.o. male with a past medical history of diet-controlled type 2 diabetes mellitus, chronic obstructive pulmonary disease, chronic diastolic congestive heart failure, dementia who was recently discharged from the medicine service on 04/26/2015 which time he was treated for acute respiratory failure in setting of possible aspiration pneumonia and possible COPD exacerbation. During that hospitalization he was treated with steroids and discharged on a prednisone taper. He was initially brought to the emergency department with complaints of pain to his buttocks. He has a history of a sacral decub. He was further worked up with CT scan of pelvis that showed soft tissue ulceration over her sacral  coccygeal spine without evidence of abscess or osteomyelitis. Labs revealed a blood sugar of 670. He was started on IV insulin. Patient unable to participate in his own plan of care or provide history and his healthcare power of attorney was not present. History was obtained from emergency room staff.  7/17: Insulin gtt stopped 7/18: Blood culture positive for GNR, acute urinary retention requiring foley placement.  Started flomax 7/19:  Repeat routine blood culture.  E. Coli reported from original [facility] Course:   T2DM with hyperglycemia likely secondary to steroids, CBG trended down on insulin gtt and he was transitioned to subcutaneous insulin and metformin.  His hemoglobin A1c is 9.7.  After discussion with family, they want to minimize painful procedures and focus on comfort so his SSI was discontinued and he was maintained on metformin to titrate to prevent extreme hyperglycemia.  Discussed using glucerna instead of ensure and avoiding juices  E. Coli bacteremia, likely secondary to UTI although urine culture is still pending.  UA was convincing for UTI as source.  He was started on ceftriaxone and will continue keflex for 14 days.  Case was discussed with Dr. Johnnye Sima from infectious disease.  Discussed using cranberry pills instead of juice at home.  Also given prescription for florator.  Acute urinary retention was likely secondary to acute UTI.  He had a foley catheter placed.  He was able to void spontaneously before discharge.  Continued flomax.    COPD, severe with recent hospitalizations with SOB. He is steroid dependent and has been on a long steroid taper, currently on prednisone 35mg  daily. He probably has iatrogenic adrenal  insufficiency.  Continue prednisone 30mg  daily and continue taper as originally prescribed by PCP.  Continued spiriva with prn albuterol. CXR demonstrated considerable emphysema and scarring.  He was given a prescription for concentrated morphine for  both pain and SOB at home.  Chronic diastolic heart failure, stable, last transthoracic echocardiogram performed on 03/29/2015 that showed reserved ejection fraction of 60-65% with grade 1 diastolic dysfunction.  Discontinued his BB due to hypotension, bradycardia, and 1-degree block (only on half of a 3.125mg  pill twice a day anyway).  D/c'd statin which PCP had said to stop at end of this pill bottle anyway.    Dementia, progressive, with debilitation.  Continue HH PT at discharge.  Focusing now on symptom management and minimizing hospitalizations.  Has an excellent PCP who is already tapering down on his medications and focusing on comfort  Stage 2 sacral decubitus ulcer with buttock pain, no signs of infection and well-managed by caregiver and [facility] RN.  Continue foam dressing.    Consultants:  None  Procedures:  CXR: Significant scarring and emphysema  CT pelvis with contrast: Soft tissue ulceration without evidence of abscess or osteomyelitis. Gas and wall thickening of bladder  Antibiotics:  Ceftriaxone 7/17 >   Discharge Exam: Filed Vitals:   05/26/15 0529  BP: 108/57  Pulse: 61  Temp: 97.4 F (36.3 C)  Resp: 20   Filed Vitals:   05/25/15 0612 05/25/15 1437 05/25/15 2109 05/26/15 0529  BP: 120/56 114/62 119/53 108/57  Pulse: 64 74 67 61  Temp: 97.6 F (36.4 C) 97.9 F (36.6 C) 97.5 F (36.4 C) 97.4 F (36.3 C)  TempSrc: Oral Oral Oral Oral  Resp: 18 20 20 20   Height:      Weight:      SpO2: 100% 100% 100% 100%     General: Frail appearing adult male, No acute distress, pleasant and confused  HEENT: NCAT, MMM  Cardiovascular: RRR, nl S1, S2 no mrg, 2+ pulses, warm extremities  Respiratory: Diminished bilateral breath sounds, no wheezes or rhonchi, no increased WOB  Abdomen: NABS, soft, NT/ND  MSK: Decreased tone and bulk, trace LEE  Neuro: 4/5 strength throughout  Discharge Instructions      Discharge Instructions    (HEART FAILURE  PATIENTS) Call MD:  Anytime you have any of the following symptoms: 1) 3 pound weight gain in 24 hours or 5 pounds in 1 week 2) shortness of breath, with or without a dry hacking cough 3) swelling in the hands, feet or stomach 4) if you have to sleep on extra pillows at night in order to breathe.    Complete by:  As directed      Call MD for:  difficulty breathing, headache or visual disturbances    Complete by:  As directed      Call MD for:  extreme fatigue    Complete by:  As directed      Call MD for:  hives    Complete by:  As directed      Call MD for:  persistant dizziness or light-headedness    Complete by:  As directed      Call MD for:  persistant nausea and vomiting    Complete by:  As directed      Call MD for:  severe uncontrolled pain    Complete by:  As directed      Call MD for:  temperature >100.4    Complete by:  As directed      Diet -  low sodium heart healthy    Complete by:  As directed      Diet Carb Modified    Complete by:  As directed      Discharge instructions    Complete by:  As directed   Please continue keflex until all the tabs are gone through August 2nd.  The next dose of keflex is due tomorrow morning.  If he develops fevers, chills, or other signs of infection, please come back to the hospital.  For his high blood sugars, please start metformin with the goal of trying to keep his blood sugars from being so high that he feels back or becomes dehydrated.  This medication can cause diarrhea so if he develops diarrhea, please stop this medication and call his primary care doctor.  Talk to his primary care doctor about this medication at your next visit.  Unless he starts to develop difficulty breathing or swelling, please do not give him his lasix.  I reduced his prednisone slightly to 30mg  a day which he should continue until he sees Dr. Dillard Essex in about 1 week.  He should also stop his carvedilol altogether.     Discharge wound care:    Complete by:  As directed    Foam dressing to sacrum, wound care as before     Increase activity slowly    Complete by:  As directed             Medication List    STOP taking these medications        benzonatate 100 MG capsule  Commonly known as:  TESSALON PERLES     carvedilol 3.125 MG tablet  Commonly known as:  COREG     cholecalciferol 1000 UNITS tablet  Commonly known as:  VITAMIN D     furosemide 40 MG tablet  Commonly known as:  LASIX     guaiFENesin 600 MG 12 hr tablet  Commonly known as:  MUCINEX     ipratropium-albuterol 0.5-2.5 (3) MG/3ML Soln  Commonly known as:  DUONEB     isosorbide mononitrate 60 MG 24 hr tablet  Commonly known as:  IMDUR     levofloxacin 250 MG tablet  Commonly known as:  LEVAQUIN     mometasone-formoterol 100-5 MCG/ACT Aero  Commonly known as:  DULERA     omega-3 acid ethyl esters 1 G capsule  Commonly known as:  LOVAZA     potassium chloride 10 MEQ tablet  Commonly known as:  K-DUR     rosuvastatin 10 MG tablet  Commonly known as:  CRESTOR      TAKE these medications        acetaminophen 325 MG tablet  Commonly known as:  TYLENOL  Take 325 mg by mouth every 6 (six) hours as needed for mild pain.     albuterol 108 (90 BASE) MCG/ACT inhaler  Commonly known as:  PROVENTIL HFA;VENTOLIN HFA  Inhale 1-2 puffs into the lungs every 6 (six) hours as needed for wheezing or shortness of breath.     aspirin 81 MG chewable tablet  Chew 81 mg by mouth daily at 6 PM.     cephALEXin 500 MG capsule  Commonly known as:  KEFLEX  Take 1 capsule (500 mg total) by mouth 2 (two) times daily.     clopidogrel 75 MG tablet  Commonly known as:  PLAVIX  TAKE 1 TABLET DAILY     docusate sodium 100 MG capsule  Commonly known as:  COLACE  Take 1 capsule (100 mg total) by mouth 2 (two) times daily.     feeding supplement (GLUCERNA SHAKE) Liqd  Take 237 mLs by mouth 3 (three) times daily between meals.     metFORMIN 500 MG tablet  Commonly known as:  GLUCOPHAGE   Take 0.5 tablets (250 mg total) by mouth 2 (two) times daily with a meal.     morphine CONCENTRATE 10 MG/0.5ML Soln concentrated solution  Take 0.25 mLs (5 mg total) by mouth every hour as needed for moderate pain, severe pain or shortness of breath.     pantoprazole sodium 40 mg/20 mL Pack  Commonly known as:  PROTONIX  Take 10 mLs (20 mg total) by mouth daily.     predniSONE 20 MG tablet  Commonly known as:  DELTASONE  Take 1.5 tablets (30 mg total) by mouth daily with breakfast. Until you follow up with your primary care doctor.     saccharomyces boulardii 250 MG capsule  Commonly known as:  FLORASTOR  Take 1 capsule (250 mg total) by mouth 2 (two) times daily.     sertraline 50 MG tablet  Commonly known as:  ZOLOFT  Take 1 tablet (50 mg total) by mouth at bedtime.     tamsulosin 0.4 MG Caps capsule  Commonly known as:  FLOMAX  Take 1 capsule (0.4 mg total) by mouth daily.     tiotropium 18 MCG inhalation capsule  Commonly known as:  SPIRIVA HANDIHALER  Place 1 capsule (18 mcg total) into inhaler and inhale daily.       Follow-up Information    Follow up with Marylynn Pearson, MD. Schedule an appointment as soon as possible for a visit in 1 week.   Specialties:  Internal Medicine, Geriatric Medicine   Contact information:   [address] [place] [address] [phone]        The results of significant diagnostics from this hospitalization (including imaging, microbiology, ancillary and laboratory) are listed below for reference.    Significant Diagnostic Studies: Dg Chest 2 View  04/28/2015   CLINICAL DATA:  Shortness of breath, recent hospitalization, history essential hypertension, COPD, ischemic dilated cardiomyopathy, chronic diastolic CHF, type II diabetes mellitus, coronary artery disease post MI and coronary PTCA, prostate cancer  EXAM: CHEST  2 VIEW  COMPARISON:  04/22/2015  FINDINGS: Rotated to the LEFT.  Enlargement of cardiac silhouette.  Atherosclerotic  calcification aorta.  Pulmonary vascularity normal.  Extensive calcified mediastinal and BILATERAL hilar adenopathy.  Emphysematous changes with subsegmental atelectasis versus consolidation in LEFT lower lobe.  Small LEFT pleural effusion.  No pneumothorax.  Bones demineralized.  IMPRESSION: Changes of COPD and old granulomatous disease.  Small LEFT pleural effusion with persistent atelectasis versus consolidation in LEFT lower lobe, little changed.   Electronically Signed   By: Lavonia Dana M.D.   On: 04/28/2015 15:22   Ct Pelvis W Contrast  05/24/2015   CLINICAL DATA:  Buttock pain for 3 weeks. Decubitus ulcer over the sacrum.  EXAM: CT PELVIS WITH CONTRAST  TECHNIQUE: Multidetector CT imaging of the pelvis was performed using the standard protocol following the bolus administration of intravenous contrast.  CONTRAST:  168mL OMNIPAQUE IOHEXOL 300 MG/ML  SOLN  COMPARISON:  CT abdomen and pelvis 12/21/2004  FINDINGS: Skin defect consistent with ulceration demonstrated over the coccygeal region extending up focally over the right side of the sacrum. Mild skin thickening and infiltration in the subcutaneous fat. No discrete fluid collection to suggest abscess. No bone erosion or sclerosis  to suggest osteomyelitis. Narrowing and sclerosis in the SI joints, likely degenerative. Mild degenerative changes in the hips and lower lumbar spine. No acute bony abnormalities are suggested.  Soft tissue pelvis demonstrates large amount of stool in the rectum, possibly indicating impaction. Visualized small and large bowel are not abnormally distended. No pelvic mass or lymphadenopathy. Prostate gland is not enlarged. The bladder demonstrates posterior wall thickening with significant gas in the bladder. This may indicate cystitis although fistula could also have this appearance. No evidence of diverticulitis.  IMPRESSION: Soft tissue ulceration over the sacral coccygeal spine region with no evidence of abscess or  osteomyelitis. Gas and asymmetric wall thickening in the bladder may indicate infection although colovesical fistula could also have this appearance.   Electronically Signed   By: Lucienne Capers M.D.   On: 05/24/2015 01:44   Dg Chest Port 1 View  05/24/2015   CLINICAL DATA:  Pneumonia. 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  EXAM: PORTABLE CHEST - 1 VIEW  COMPARISON:  04/28/2015, 04/23/2015  FINDINGS: Lungs are hyperinflated. Again noted are numerous calcified hilar and mediastinal lymph nodes unchanged over prior studies. The heart is enlarged. Small left pleural effusion is stable. There are no focal consolidations or pleural effusions.  IMPRESSION: 1. Cardiomegaly without edema. 2. Small left pleural effusion.   Electronically Signed   By: Nolon Nations M.D.   On: 05/24/2015 07:59    Microbiology: Recent Results (from the past 240 hour(s))  Urine culture     Status: None (Preliminary result)   Collection Time: 05/24/15  3:18 AM  Result Value Ref Range Status   Specimen Description URINE, RANDOM  Final   Special Requests Normal  Final   Culture   Final    CULTURE REINCUBATED FOR BETTER GROWTH Performed at [facility]    Report Status PENDING  Incomplete  MRSA PCR Screening     Status: None   Collection Time: 05/24/15  4:29 AM  Result Value Ref Range Status   MRSA by PCR NEGATIVE NEGATIVE Final    Comment:        The GeneXpert MRSA Assay (FDA approved for NASAL specimens only), is one component of a comprehensive MRSA colonization surveillance program. It is not intended to diagnose MRSA infection nor to guide or monitor treatment for MRSA infections.   Culture, blood (routine x 2)     Status: None   Collection Time: 05/24/15  5:12 AM  Result Value Ref Range Status   Specimen Description BLOOD RIGHT ANTECUBITAL  Final   Special Requests  BOTTLES DRAWN AEROBIC AND ANAEROBIC 10CC  Final   Culture  Setup Time   Final    GRAM NEGATIVE RODS ANAEROBIC BOTTLE ONLY CRITICAL RESULT CALLED TO, READ BACK BY AND VERIFIED WITH: BENJAMIN,T RN 05/24/15 2030 [place] CONFIRMED BY B MARTIN    Culture   Final    ESCHERICHIA COLI Performed at [facility]    Report Status 05/26/2015 FINAL  Final   Organism ID, Bacteria ESCHERICHIA COLI  Final      Susceptibility   Escherichia coli - MIC*    AMPICILLIN >=32 RESISTANT Resistant     CEFAZOLIN <=4 SENSITIVE Sensitive     CEFEPIME <=1 SENSITIVE Sensitive     CEFTAZIDIME <=1 SENSITIVE Sensitive     CEFTRIAXONE <=1 SENSITIVE Sensitive     CIPROFLOXACIN >=4 RESISTANT Resistant     GENTAMICIN <=1 SENSITIVE Sensitive  IMIPENEM <=0.25 SENSITIVE Sensitive     TRIMETH/SULFA <=20 SENSITIVE Sensitive     AMPICILLIN/SULBACTAM 16 INTERMEDIATE Intermediate     PIP/TAZO <=4 SENSITIVE Sensitive     * ESCHERICHIA COLI  Culture, blood (routine x 2)     Status: None (Preliminary result)   Collection Time: 05/24/15  5:19 AM  Result Value Ref Range Status   Specimen Description BLOOD RIGHT HAND  Final   Special Requests PEDIATRICS 3CC  Final   Culture   Final    NO GROWTH 1 DAY Performed at [facility]    Report Status PENDING  Incomplete     Labs: Basic Metabolic Panel:  Recent Labs Lab 05/24/15 0345 05/24/15 0356 05/24/15 0512 05/24/15 0942 05/25/15 0515 05/26/15 0521  NA 135 138 139 143 140 136  K 4.5 4.5 4.2 4.8 4.1 4.8  CL 104 101 104 111 107 106  CO2 24  --  26 23 26  21*  GLUCOSE 533* 520* 463* 93 110* 168*  BUN 46* 39* 43* 40* 36* 30*  CREATININE 1.35* 1.10 1.26* 1.00 1.03 0.81  CALCIUM 8.2*  --  8.5* 8.7* 8.6* 8.6*  MG  --   --  2.1  --   --   --    Liver Function Tests:  Recent Labs Lab 05/24/15 0345  AST 19  ALT 16*  ALKPHOS 55  BILITOT 0.5  PROT 5.4*  ALBUMIN 2.9*   No results for input(s): LIPASE, AMYLASE in the last 168 hours. No results for  input(s): AMMONIA in the last 168 hours. CBC:  Recent Labs Lab 05/24/15 0056 05/24/15 0103 05/24/15 0356 05/24/15 0512  WBC 5.0  --   --  4.9  NEUTROABS 4.4  --   --   --   HGB 9.1* 7.8* 8.8* 10.3*  HCT 26.4* 23.0* 26.0* 30.0*  MCV 88.9  --   --  89.0  PLT 126*  --   --  133*   Cardiac Enzymes: No results for input(s): CKTOTAL, CKMB, CKMBINDEX, TROPONINI in the last 168 hours. BNP: BNP (last 3 results)  Recent Labs  04/17/15 2150 04/23/15 0022 04/28/15 1502  BNP 653.6* 464.5* 304.9*    ProBNP (last 3 results) No results for input(s): PROBNP in the last 8760 hours.  CBG:  Recent Labs Lab 05/25/15 1157 05/25/15 1713 05/25/15 2222 05/26/15 0757 05/26/15 1206  GLUCAP 209* 300* 223* 178* 267*    Time coordinating discharge: 35 minutes  Signed:  Manasi Dishon  [facility] 05/26/2015, 12:16 PM

## 2015-05-26 NOTE — Care Management Important Message (Signed)
Important Message  Patient Details  Name: [name] MRN: [id number] Date of Birth: [birth year]   Medicare Important Message Given:  Yes-second notification given    Camillo Flaming 05/26/2015, 11:49 AMImportant Message  Patient Details  Name: [name] MRN: [id number] Date of Birth: Dec 19, 1928   Medicare Important Message Given:  Yes-second notification given    Camillo Flaming 05/26/2015, 11:49 AM

## 2015-05-26 NOTE — Consult Note (Signed)
   [facility] CM Inpatient Consult   05/26/2015  FISHEL WAMBLE 05-25-1929 [id number]   Please extensive note under chart review tab in notes section in EPIC from [place].  Referral received for [facility] Care Management services. Patient has had x6 admits in the past 6 months. He was recently engaged and followed by [facility]. However, his Chauncey Reading, [name] declined [place] Management services during home visit. Went to bedside during this hospitalization and spoke with patient and caregiver. Called Venetia Constable [phone]) again to offer [place] [facility]. She states, " I appreciate what you do in the community. Everything you offer and are attempting to implement, we are already doing.". Therefore she declined services again. Will make inpatient RNCM aware as well.   Left message Dr. Melinda Crutch and/or nurse at MD office to make aware of refusal of [facility] services and to request for them to speak with Ms Baxter Flattery if they feel [place] Management is needed. Left contact information.  Marthenia Rolling, MSN-Ed, RN,BSN [facility] Liaison [phone]

## 2015-05-27 LAB — URINE CULTURE
Culture: >=100000
SPECIAL REQUESTS: NORMAL

## 2015-05-28 ENCOUNTER — Ambulatory Visit: Payer: PRIVATE HEALTH INSURANCE | Admitting: Cardiology

## 2015-05-29 LAB — CULTURE, BLOOD (ROUTINE X 2): Culture: NO GROWTH

## 2015-05-31 LAB — CULTURE, BLOOD (SINGLE): Culture: NO GROWTH

## 2015-05-31 NOTE — ED Notes (Signed)
There were qty 2  4 mg tubes pulled, pt received 6 mg total IV,  2 mg wasted, spoke with Corene Cornea in Pharmacy , and verified with Tiffany RN charge and Clinical biochemist

## 2015-06-03 NOTE — Patient Outreach (Signed)
[facility]) Care Management  06/03/2015  [name] [birth year] [id number]   Notification from Dannielle Huh, RN to close case due to [facility] refused [place] Management services.  [name]. [place], [place] Management [place] Assistant Phone: [phone] Fax: [phone]

## 2015-06-12 ENCOUNTER — Encounter (HOSPITAL_COMMUNITY): Payer: Self-pay

## 2015-06-12 ENCOUNTER — Emergency Department (HOSPITAL_COMMUNITY)
Admission: EM | Admit: 2015-06-12 | Discharge: 2015-06-13 | Disposition: A | Discharge status: Home / Self Care | Payer: Medicare Other | Attending: Emergency Medicine | Admitting: Emergency Medicine

## 2015-06-12 ENCOUNTER — Emergency Department (HOSPITAL_COMMUNITY): Payer: Medicare Other

## 2015-06-12 DIAGNOSIS — J449 Chronic obstructive pulmonary disease, unspecified: Secondary | ICD-10-CM | POA: Insufficient documentation

## 2015-06-12 DIAGNOSIS — Z7952 Long term (current) use of systemic steroids: Secondary | ICD-10-CM | POA: Diagnosis not present

## 2015-06-12 DIAGNOSIS — I10 Essential (primary) hypertension: Secondary | ICD-10-CM | POA: Insufficient documentation

## 2015-06-12 DIAGNOSIS — Z85828 Personal history of other malignant neoplasm of skin: Secondary | ICD-10-CM | POA: Insufficient documentation

## 2015-06-12 DIAGNOSIS — Z9861 Coronary angioplasty status: Secondary | ICD-10-CM | POA: Insufficient documentation

## 2015-06-12 DIAGNOSIS — K219 Gastro-esophageal reflux disease without esophagitis: Secondary | ICD-10-CM | POA: Diagnosis not present

## 2015-06-12 DIAGNOSIS — K5641 Fecal impaction: Secondary | ICD-10-CM | POA: Insufficient documentation

## 2015-06-12 DIAGNOSIS — Z8546 Personal history of malignant neoplasm of prostate: Secondary | ICD-10-CM | POA: Diagnosis not present

## 2015-06-12 DIAGNOSIS — M1990 Unspecified osteoarthritis, unspecified site: Secondary | ICD-10-CM | POA: Diagnosis not present

## 2015-06-12 DIAGNOSIS — I2510 Atherosclerotic heart disease of native coronary artery without angina pectoris: Secondary | ICD-10-CM | POA: Diagnosis not present

## 2015-06-12 DIAGNOSIS — Z87448 Personal history of other diseases of urinary system: Secondary | ICD-10-CM | POA: Diagnosis not present

## 2015-06-12 DIAGNOSIS — Z7902 Long term (current) use of antithrombotics/antiplatelets: Secondary | ICD-10-CM | POA: Diagnosis not present

## 2015-06-12 DIAGNOSIS — I5032 Chronic diastolic (congestive) heart failure: Secondary | ICD-10-CM | POA: Insufficient documentation

## 2015-06-12 DIAGNOSIS — E119 Type 2 diabetes mellitus without complications: Secondary | ICD-10-CM | POA: Insufficient documentation

## 2015-06-12 DIAGNOSIS — Z79899 Other long term (current) drug therapy: Secondary | ICD-10-CM | POA: Diagnosis not present

## 2015-06-12 DIAGNOSIS — Z7982 Long term (current) use of aspirin: Secondary | ICD-10-CM | POA: Diagnosis not present

## 2015-06-12 DIAGNOSIS — K5900 Constipation, unspecified: Secondary | ICD-10-CM | POA: Diagnosis present

## 2015-06-12 MED ORDER — LIDOCAINE HCL 2 % EX GEL
1.0000 "application " | Freq: Once | CUTANEOUS | Status: AC
  Administered 2015-06-12: 1 via TOPICAL
  Filled 2015-06-12: qty 10

## 2015-06-12 MED ORDER — SODIUM CHLORIDE 0.9 % IV BOLUS (SEPSIS)
500.0000 mL | Freq: Once | INTRAVENOUS | Status: AC
  Administered 2015-06-12: 500 mL via INTRAVENOUS

## 2015-06-12 MED ORDER — GLYCERIN (LAXATIVE) 2.1 G RE SUPP
1.0000 | Freq: Once | RECTAL | Status: AC
  Administered 2015-06-12: 1 via RECTAL
  Filled 2015-06-12: qty 1

## 2015-06-12 MED ORDER — DOCUSATE SODIUM 100 MG PO CAPS
100.0000 mg | ORAL_CAPSULE | Freq: Once | ORAL | Status: AC
  Administered 2015-06-12: 100 mg via ORAL
  Filled 2015-06-12: qty 1

## 2015-06-12 MED ORDER — MILK AND MOLASSES ENEMA
1.0000 | Freq: Once | RECTAL | Status: AC
Start: 2015-06-12 — End: 2015-06-12
  Administered 2015-06-12: 250 mL via RECTAL
  Filled 2015-06-12: qty 250

## 2015-06-12 MED ORDER — LIDOCAINE HCL 2 % EX GEL
1.0000 "application " | Freq: Once | CUTANEOUS | Status: AC
  Administered 2015-06-12: 1
  Filled 2015-06-12: qty 10

## 2015-06-12 NOTE — ED Provider Notes (Signed)
  Face-to-face evaluation   History: Patient here for a fecal disimpaction from rectum. He was admitted 2 weeks ago with Escherichia coli bacteremia. He is here with his caregiver.  Physical exam: Alert, calm, cooperative. Anus/Rectum- large fecal impaction, brown stool  Medical screening examination/treatment/procedure(s) were conducted as a shared visit with non-physician practitioner(s) and myself.  I personally evaluated the patient during the encounter  Daleen Bo, MD 06/17/15 1252

## 2015-06-12 NOTE — ED Notes (Addendum)
Pt had a medium sized bowel movement. His abdomen appears less distended and he reports that his pain is decreased. He only has pain upon palpation and a constant pain at his rectum which is reddened and appears irritated

## 2015-06-12 NOTE — Progress Notes (Signed)
EDCM went to speak to patient at bedside, however about to perform procedure on patient.
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
## 2015-06-12 NOTE — ED Notes (Addendum)
Pt caregiver states pt has been intermittently constipated x 2wks.  Caregiver states pt has been given multiple enemas, magnesium citrate, and laxatives.  Caregiver states that pt has loose stool around impaction at rectum.  Caregiver states she attempted to manually disimpact patient last evening without success.

## 2015-06-13 MED ORDER — LACTULOSE 10 GM/15ML PO SOLN: 10.0000 g | Freq: Two times a day (BID) | ORAL | Status: DC

## 2015-06-13 MED ORDER — BISACODYL 10 MG RE SUPP: 10.0000 mg | RECTAL | Status: AC | PRN

## 2015-06-13 NOTE — Discharge Instructions (Signed)
Return here as needed.  Follow-up with your primary care doctor °

## 2015-06-19 ENCOUNTER — Inpatient Hospital Stay (HOSPITAL_COMMUNITY): Payer: Medicare Other

## 2015-06-19 ENCOUNTER — Emergency Department (HOSPITAL_COMMUNITY): Payer: Medicare Other

## 2015-06-19 ENCOUNTER — Encounter (HOSPITAL_COMMUNITY): Payer: Self-pay | Admitting: *Deleted

## 2015-06-19 ENCOUNTER — Inpatient Hospital Stay (HOSPITAL_COMMUNITY)
Admission: EM | Admit: 2015-06-19 | Discharge: 2015-06-25 | DRG: 070 | Disposition: A | Discharge status: Home Health | Payer: Medicare Other | Attending: Family Medicine | Admitting: Family Medicine

## 2015-06-19 DIAGNOSIS — J438 Other emphysema: Secondary | ICD-10-CM | POA: Diagnosis not present

## 2015-06-19 DIAGNOSIS — Z794 Long term (current) use of insulin: Secondary | ICD-10-CM | POA: Diagnosis not present

## 2015-06-19 DIAGNOSIS — Z6821 Body mass index (BMI) 21.0-21.9, adult: Secondary | ICD-10-CM | POA: Diagnosis not present

## 2015-06-19 DIAGNOSIS — R339 Retention of urine, unspecified: Secondary | ICD-10-CM | POA: Insufficient documentation

## 2015-06-19 DIAGNOSIS — I255 Ischemic cardiomyopathy: Secondary | ICD-10-CM | POA: Diagnosis present

## 2015-06-19 DIAGNOSIS — Z7902 Long term (current) use of antithrombotics/antiplatelets: Secondary | ICD-10-CM | POA: Diagnosis not present

## 2015-06-19 DIAGNOSIS — Z85828 Personal history of other malignant neoplasm of skin: Secondary | ICD-10-CM

## 2015-06-19 DIAGNOSIS — E440 Moderate protein-calorie malnutrition: Secondary | ICD-10-CM | POA: Diagnosis present

## 2015-06-19 DIAGNOSIS — E785 Hyperlipidemia, unspecified: Secondary | ICD-10-CM | POA: Diagnosis present

## 2015-06-19 DIAGNOSIS — N3289 Other specified disorders of bladder: Secondary | ICD-10-CM | POA: Diagnosis present

## 2015-06-19 DIAGNOSIS — Z66 Do not resuscitate: Secondary | ICD-10-CM | POA: Diagnosis present

## 2015-06-19 DIAGNOSIS — J189 Pneumonia, unspecified organism: Secondary | ICD-10-CM | POA: Diagnosis present

## 2015-06-19 DIAGNOSIS — Z7982 Long term (current) use of aspirin: Secondary | ICD-10-CM | POA: Diagnosis not present

## 2015-06-19 DIAGNOSIS — Z825 Family history of asthma and other chronic lower respiratory diseases: Secondary | ICD-10-CM

## 2015-06-19 DIAGNOSIS — M1990 Unspecified osteoarthritis, unspecified site: Secondary | ICD-10-CM | POA: Diagnosis present

## 2015-06-19 DIAGNOSIS — E11649 Type 2 diabetes mellitus with hypoglycemia without coma: Secondary | ICD-10-CM | POA: Diagnosis present

## 2015-06-19 DIAGNOSIS — F0390 Unspecified dementia without behavioral disturbance: Secondary | ICD-10-CM | POA: Diagnosis present

## 2015-06-19 DIAGNOSIS — Z8249 Family history of ischemic heart disease and other diseases of the circulatory system: Secondary | ICD-10-CM | POA: Diagnosis not present

## 2015-06-19 DIAGNOSIS — R410 Disorientation, unspecified: Secondary | ICD-10-CM

## 2015-06-19 DIAGNOSIS — N182 Chronic kidney disease, stage 2 (mild): Secondary | ICD-10-CM | POA: Diagnosis present

## 2015-06-19 DIAGNOSIS — E1122 Type 2 diabetes mellitus with diabetic chronic kidney disease: Secondary | ICD-10-CM | POA: Diagnosis present

## 2015-06-19 DIAGNOSIS — I2510 Atherosclerotic heart disease of native coronary artery without angina pectoris: Secondary | ICD-10-CM | POA: Diagnosis present

## 2015-06-19 DIAGNOSIS — K219 Gastro-esophageal reflux disease without esophagitis: Secondary | ICD-10-CM | POA: Diagnosis present

## 2015-06-19 DIAGNOSIS — Z8701 Personal history of pneumonia (recurrent): Secondary | ICD-10-CM | POA: Diagnosis not present

## 2015-06-19 DIAGNOSIS — I10 Essential (primary) hypertension: Secondary | ICD-10-CM | POA: Diagnosis not present

## 2015-06-19 DIAGNOSIS — E1165 Type 2 diabetes mellitus with hyperglycemia: Secondary | ICD-10-CM | POA: Diagnosis present

## 2015-06-19 DIAGNOSIS — R059 Cough, unspecified: Secondary | ICD-10-CM

## 2015-06-19 DIAGNOSIS — I129 Hypertensive chronic kidney disease with stage 1 through stage 4 chronic kidney disease, or unspecified chronic kidney disease: Secondary | ICD-10-CM | POA: Diagnosis present

## 2015-06-19 DIAGNOSIS — K6289 Other specified diseases of anus and rectum: Secondary | ICD-10-CM | POA: Diagnosis not present

## 2015-06-19 DIAGNOSIS — R05 Cough: Secondary | ICD-10-CM

## 2015-06-19 DIAGNOSIS — I739 Peripheral vascular disease, unspecified: Secondary | ICD-10-CM | POA: Diagnosis present

## 2015-06-19 DIAGNOSIS — Z800 Family history of malignant neoplasm of digestive organs: Secondary | ICD-10-CM | POA: Diagnosis not present

## 2015-06-19 DIAGNOSIS — I5032 Chronic diastolic (congestive) heart failure: Secondary | ICD-10-CM | POA: Diagnosis not present

## 2015-06-19 DIAGNOSIS — G9340 Encephalopathy, unspecified: Secondary | ICD-10-CM | POA: Diagnosis present

## 2015-06-19 DIAGNOSIS — Z803 Family history of malignant neoplasm of breast: Secondary | ICD-10-CM

## 2015-06-19 DIAGNOSIS — Z8546 Personal history of malignant neoplasm of prostate: Secondary | ICD-10-CM | POA: Diagnosis not present

## 2015-06-19 DIAGNOSIS — Z79899 Other long term (current) drug therapy: Secondary | ICD-10-CM

## 2015-06-19 DIAGNOSIS — Z888 Allergy status to other drugs, medicaments and biological substances status: Secondary | ICD-10-CM

## 2015-06-19 DIAGNOSIS — L8991 Pressure ulcer of unspecified site, stage 1: Secondary | ICD-10-CM | POA: Diagnosis present

## 2015-06-19 DIAGNOSIS — Z7952 Long term (current) use of systemic steroids: Secondary | ICD-10-CM | POA: Diagnosis not present

## 2015-06-19 DIAGNOSIS — Z87440 Personal history of urinary (tract) infections: Secondary | ICD-10-CM | POA: Diagnosis not present

## 2015-06-19 DIAGNOSIS — J439 Emphysema, unspecified: Secondary | ICD-10-CM | POA: Diagnosis not present

## 2015-06-19 DIAGNOSIS — J449 Chronic obstructive pulmonary disease, unspecified: Secondary | ICD-10-CM | POA: Diagnosis present

## 2015-06-19 DIAGNOSIS — IMO0002 Reserved for concepts with insufficient information to code with codable children: Secondary | ICD-10-CM | POA: Diagnosis present

## 2015-06-19 DIAGNOSIS — I252 Old myocardial infarction: Secondary | ICD-10-CM

## 2015-06-19 DIAGNOSIS — Z79891 Long term (current) use of opiate analgesic: Secondary | ICD-10-CM | POA: Diagnosis not present

## 2015-06-19 DIAGNOSIS — R7989 Other specified abnormal findings of blood chemistry: Secondary | ICD-10-CM

## 2015-06-19 DIAGNOSIS — R109 Unspecified abdominal pain: Secondary | ICD-10-CM | POA: Diagnosis present

## 2015-06-19 DIAGNOSIS — L8990 Pressure ulcer of unspecified site, unspecified stage: Secondary | ICD-10-CM | POA: Insufficient documentation

## 2015-06-19 LAB — COMPREHENSIVE METABOLIC PANEL
ALBUMIN: 3.4 g/dL — AB (ref 3.5–5.0)
ALK PHOS: 57 U/L (ref 38–126)
ALT: 17 U/L (ref 17–63)
AST: 24 U/L (ref 15–41)
Anion gap: 10 (ref 5–15)
BILIRUBIN TOTAL: 0.7 mg/dL (ref 0.3–1.2)
BUN: 21 mg/dL — AB (ref 6–20)
CHLORIDE: 103 mmol/L (ref 101–111)
CO2: 24 mmol/L (ref 22–32)
CREATININE: 0.96 mg/dL (ref 0.61–1.24)
Calcium: 8.9 mg/dL (ref 8.9–10.3)
GFR calc non Af Amer: >60 mL/min (ref >60)
Glucose, Bld: 321 mg/dL — ABNORMAL HIGH (ref 65–99)
POTASSIUM: 4.9 mmol/L (ref 3.5–5.1)
Sodium: 137 mmol/L (ref 135–145)
TOTAL PROTEIN: 6.4 g/dL — AB (ref 6.5–8.1)

## 2015-06-19 LAB — CBC WITH DIFFERENTIAL/PLATELET
Basophils Absolute: 0 10*3/uL (ref 0.0–0.1)
Basophils Relative: 0 % (ref 0–1)
EOS PCT: 0 % (ref 0–5)
Eosinophils Absolute: 0 10*3/uL (ref 0.0–0.7)
HEMATOCRIT: 35.4 % — AB (ref 39.0–52.0)
HEMOGLOBIN: 11.7 g/dL — AB (ref 13.0–17.0)
LYMPHS ABS: 0.2 10*3/uL — AB (ref 0.7–4.0)
Lymphocytes Relative: 4 % — ABNORMAL LOW (ref 12–46)
MCH: 31 pg (ref 26.0–34.0)
MCHC: 33.1 g/dL (ref 30.0–36.0)
MCV: 93.7 fL (ref 78.0–100.0)
MONO ABS: 0.2 10*3/uL (ref 0.1–1.0)
MONOS PCT: 3 % (ref 3–12)
Neutro Abs: 5.1 10*3/uL (ref 1.7–7.7)
Neutrophils Relative %: 93 % — ABNORMAL HIGH (ref 43–77)
PLATELETS: 194 10*3/uL (ref 150–400)
RBC: 3.78 MIL/uL — ABNORMAL LOW (ref 4.22–5.81)
RDW: 17.1 % — ABNORMAL HIGH (ref 11.5–15.5)
WBC: 5.5 10*3/uL (ref 4.0–10.5)

## 2015-06-19 LAB — I-STAT CG4 LACTIC ACID, ED: Lactic Acid, Venous: 3.78 mmol/L (ref 0.5–2.0)

## 2015-06-19 LAB — URINALYSIS, ROUTINE W REFLEX MICROSCOPIC
BILIRUBIN URINE: NEGATIVE
Glucose, UA: 100 mg/dL — AB
HGB URINE DIPSTICK: NEGATIVE
Ketones, ur: NEGATIVE mg/dL
NITRITE: NEGATIVE
PH: 7 (ref 5.0–8.0)
Protein, ur: NEGATIVE mg/dL
Specific Gravity, Urine: 1.005 (ref 1.005–1.030)
UROBILINOGEN UA: 0.2 mg/dL (ref 0.0–1.0)

## 2015-06-19 LAB — GLUCOSE, CAPILLARY: GLUCOSE-CAPILLARY: 157 mg/dL — AB (ref 65–99)

## 2015-06-19 LAB — LACTIC ACID, PLASMA: LACTIC ACID, VENOUS: 2 mmol/L (ref 0.5–2.0)

## 2015-06-19 LAB — URINE MICROSCOPIC-ADD ON

## 2015-06-19 MED ORDER — POLYETHYLENE GLYCOL 3350 17 G PO PACK: 17.0000 g | PACK | Freq: Every day | ORAL | Status: DC | PRN

## 2015-06-19 MED ORDER — BISACODYL 10 MG RE SUPP: 10.0000 mg | RECTAL | Status: DC | PRN

## 2015-06-19 MED ORDER — SERTRALINE HCL 50 MG PO TABS
50.0000 mg | ORAL_TABLET | Freq: Every day | ORAL | Status: DC
Start: 2015-06-19 — End: 2015-06-26
  Administered 2015-06-19 – 2015-06-25 (×7): 50 mg via ORAL
  Filled 2015-06-19 (×7): qty 1

## 2015-06-19 MED ORDER — MORPHINE SULFATE (CONCENTRATE) 10 MG/0.5ML PO SOLN
5.0000 mg | ORAL | Status: DC | PRN
  Administered 2015-06-20 – 2015-06-22 (×4): 5 mg via ORAL
  Filled 2015-06-19 (×4): qty 0.5

## 2015-06-19 MED ORDER — ACETAMINOPHEN 325 MG PO TABS: 325.0000 mg | ORAL_TABLET | Freq: Four times a day (QID) | ORAL | Status: DC | PRN

## 2015-06-19 MED ORDER — TAMSULOSIN HCL 0.4 MG PO CAPS
0.4000 mg | ORAL_CAPSULE | Freq: Every day | ORAL | Status: DC
Start: 2015-06-19 — End: 2015-06-26
  Administered 2015-06-20 – 2015-06-25 (×6): 0.4 mg via ORAL
  Filled 2015-06-19 (×6): qty 1

## 2015-06-19 MED ORDER — CLOPIDOGREL BISULFATE 75 MG PO TABS
75.0000 mg | ORAL_TABLET | Freq: Every day | ORAL | Status: DC
Start: 2015-06-20 — End: 2015-06-26
  Administered 2015-06-20 – 2015-06-25 (×6): 75 mg via ORAL
  Filled 2015-06-19 (×6): qty 1

## 2015-06-19 MED ORDER — ALBUTEROL SULFATE (2.5 MG/3ML) 0.083% IN NEBU
2.5000 mg | INHALATION_SOLUTION | Freq: Four times a day (QID) | RESPIRATORY_TRACT | Status: DC | PRN
  Administered 2015-06-20: 2.5 mg via RESPIRATORY_TRACT
  Filled 2015-06-19: qty 3

## 2015-06-19 MED ORDER — SACCHAROMYCES BOULARDII 250 MG PO CAPS
250.0000 mg | ORAL_CAPSULE | Freq: Two times a day (BID) | ORAL | Status: DC
  Administered 2015-06-19 – 2015-06-25 (×13): 250 mg via ORAL
  Filled 2015-06-19 (×13): qty 1

## 2015-06-19 MED ORDER — INSULIN ASPART 100 UNIT/ML ~~LOC~~ SOLN
0.0000 [IU] | Freq: Three times a day (TID) | SUBCUTANEOUS | Status: DC
  Administered 2015-06-20 (×2): 3 [IU] via SUBCUTANEOUS
  Administered 2015-06-21: 1 [IU] via SUBCUTANEOUS
  Administered 2015-06-21: 3 [IU] via SUBCUTANEOUS
  Administered 2015-06-21: 5 [IU] via SUBCUTANEOUS
  Administered 2015-06-22: 7 [IU] via SUBCUTANEOUS
  Administered 2015-06-22: 2 [IU] via SUBCUTANEOUS
  Administered 2015-06-22: 7 [IU] via SUBCUTANEOUS
  Administered 2015-06-23: 2 [IU] via SUBCUTANEOUS
  Administered 2015-06-23: 7 [IU] via SUBCUTANEOUS

## 2015-06-19 MED ORDER — IPRATROPIUM-ALBUTEROL 0.5-2.5 (3) MG/3ML IN SOLN
3.0000 mL | Freq: Two times a day (BID) | RESPIRATORY_TRACT | Status: DC
  Administered 2015-06-19 – 2015-06-25 (×12): 3 mL via RESPIRATORY_TRACT
  Filled 2015-06-19 (×13): qty 3

## 2015-06-19 MED ORDER — GLUCERNA SHAKE PO LIQD
237.0000 mL | Freq: Three times a day (TID) | ORAL | Status: DC
  Administered 2015-06-20 – 2015-06-25 (×15): 237 mL via ORAL
  Filled 2015-06-19 (×18): qty 237

## 2015-06-19 MED ORDER — PREDNISONE 20 MG PO TABS
20.0000 mg | ORAL_TABLET | Freq: Every day | ORAL | Status: DC
  Administered 2015-06-20 – 2015-06-25 (×6): 20 mg via ORAL
  Filled 2015-06-19 (×6): qty 1

## 2015-06-19 MED ORDER — PANTOPRAZOLE SODIUM 40 MG PO PACK
20.0000 mg | PACK | Freq: Every day | ORAL | Status: DC
  Administered 2015-06-19 – 2015-06-25 (×7): 20 mg via ORAL
  Filled 2015-06-19 (×10): qty 20

## 2015-06-19 MED ORDER — PREDNISONE 5 MG PO TABS
5.0000 mg | ORAL_TABLET | Freq: Every day | ORAL | Status: DC
  Administered 2015-06-20 – 2015-06-25 (×6): 5 mg via ORAL
  Filled 2015-06-19 (×6): qty 1

## 2015-06-19 MED ORDER — DOCUSATE SODIUM 100 MG PO CAPS
100.0000 mg | ORAL_CAPSULE | Freq: Two times a day (BID) | ORAL | Status: DC
  Filled 2015-06-19: qty 1

## 2015-06-19 MED ORDER — INSULIN DETEMIR 100 UNIT/ML ~~LOC~~ SOLN
10.0000 [IU] | Freq: Every day | SUBCUTANEOUS | Status: DC
  Administered 2015-06-19: 10 [IU] via SUBCUTANEOUS
  Filled 2015-06-19: qty 0.1

## 2015-06-19 MED ORDER — DEXTROSE 5 % IV SOLN: 500.0000 mg | Freq: Once | INTRAVENOUS | Status: DC

## 2015-06-19 MED ORDER — IOHEXOL 300 MG/ML  SOLN
100.0000 mL | Freq: Once | INTRAMUSCULAR | Status: AC | PRN
  Administered 2015-06-19: 100 mL via INTRAVENOUS

## 2015-06-19 MED ORDER — ASPIRIN 81 MG PO CHEW
81.0000 mg | CHEWABLE_TABLET | Freq: Every day | ORAL | Status: DC
  Administered 2015-06-20 – 2015-06-24 (×5): 81 mg via ORAL
  Filled 2015-06-19 (×7): qty 1

## 2015-06-19 MED ORDER — CEFTRIAXONE SODIUM 2 G IJ SOLR
2.0000 g | Freq: Once | INTRAMUSCULAR | Status: AC
  Administered 2015-06-19: 2 g via INTRAVENOUS
  Filled 2015-06-19: qty 2

## 2015-06-19 MED ORDER — ALBUTEROL SULFATE HFA 108 (90 BASE) MCG/ACT IN AERS: 1.0000 | INHALATION_SPRAY | Freq: Four times a day (QID) | RESPIRATORY_TRACT | Status: DC | PRN

## 2015-06-19 MED ORDER — DEXTROSE 5 % IV SOLN: 2.0000 g | INTRAVENOUS | Status: DC

## 2015-06-19 MED ORDER — HEPARIN SODIUM (PORCINE) 5000 UNIT/ML IJ SOLN
5000.0000 [IU] | Freq: Three times a day (TID) | INTRAMUSCULAR | Status: DC
  Administered 2015-06-19 – 2015-06-25 (×16): 5000 [IU] via SUBCUTANEOUS
  Filled 2015-06-19 (×20): qty 1
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
## 2015-06-19 NOTE — ED Notes (Signed)
Mingo Amber, EDP made aware of patient CG4 lactic result.

## 2015-06-19 NOTE — ED Notes (Signed)
Patient has history of recurrent urinary tract infections. Patient has been more confused over the past two days and his mobility has decreased as well. Patient complains of generalized body aches. Patient is from home.
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
## 2015-06-19 NOTE — Care Management Note (Addendum)
Case Management Note  Patient Details  Name: DRAVON NOTT MRN: [id number] Date of Birth: 12-Nov-1928  Subjective/Objective:          81 yr old medicare covered pt d/c ?05/26/15 after hospitalized for e coli bacteremia, DM 2 uncontrolled D/c with Keflex po to be taken until 06/09/15 pcp was Dr Dillard Essex but Anderson Malta states new pcp is [facility]  [phone] CM noted in EPIC notes Dr Dillard Essex denied need for [facility].  Pt did not get to go to any f/u doctor appts since July d/c per [facility]  Pt alert to person and place but not time and partial situation Pt able to tell Cm his name, he was at the hospital, but informed CM " I get things turned around", "I did not feel good" when asked about why he was in ED and if he previously had home health "Anderson Malta is around here somewhere"     Anderson Malta reports pt had received IV antibiotic via [place] [facility] for "seven days" She voiced concern with pt recurrent UTIs States pt has not been seen by urologist  EDP consulting [facility] for admission    Action/Plan:  Noted Cm consult to follow while hospitalized Reviewed EPIC notes, spoke with pt and Anderson Malta [phone] Spoke with Dr Mingo Amber about request for further home health per Anderson Malta Dr Mingo Amber to enter orders  1900 Faxed face sheet including new dr #, home health orders and EDP notes to [facility] home care [phone] Orpha Bur (PT working with pt)  with fax confirmation received Called office [phone] no answer automated voice message request if not an emergency to return call during normal business hours Pt and Anderson Malta updated Discussed that CM can not confirm if pt d/c this weekend that [facility] home care will initiate services until next week  related to office closed Anderson Malta voiced understanding  Expected Discharge Date:   (unknown)      pending          Expected Discharge Plan:   pending   In-House Referral:   na  Discharge planning Services   pending   Post Acute  Care Choice:   pending  Choice offered to:     DME Arranged:   na DME Agency:   na  HH Arranged:   [place] Agency:   [facility]   Status of Service:   completed Additional Comments:  Robbie Lis, RN 06/19/2015, 6:27 PM
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
## 2015-06-20 DIAGNOSIS — R339 Retention of urine, unspecified: Secondary | ICD-10-CM | POA: Insufficient documentation

## 2015-06-20 DIAGNOSIS — L8990 Pressure ulcer of unspecified site, unspecified stage: Secondary | ICD-10-CM | POA: Insufficient documentation

## 2015-06-20 LAB — BASIC METABOLIC PANEL
Anion gap: 8 (ref 5–15)
BUN: 18 mg/dL (ref 6–20)
CO2: 25 mmol/L (ref 22–32)
CREATININE: 0.83 mg/dL (ref 0.61–1.24)
Calcium: 8.7 mg/dL — ABNORMAL LOW (ref 8.9–10.3)
Chloride: 107 mmol/L (ref 101–111)
GFR calc Af Amer: >60 mL/min (ref >60)
GFR calc non Af Amer: >60 mL/min (ref >60)
GLUCOSE: 96 mg/dL (ref 65–99)
POTASSIUM: 4.2 mmol/L (ref 3.5–5.1)
Sodium: 140 mmol/L (ref 135–145)

## 2015-06-20 LAB — GLUCOSE, CAPILLARY
GLUCOSE-CAPILLARY: 65 mg/dL (ref 65–99)
GLUCOSE-CAPILLARY: 237 mg/dL — AB (ref 65–99)
GLUCOSE-CAPILLARY: 220 mg/dL — AB (ref 65–99)
GLUCOSE-CAPILLARY: 167 mg/dL — AB (ref 65–99)
Glucose-Capillary: 101 mg/dL — ABNORMAL HIGH (ref 65–99)

## 2015-06-20 LAB — CBC
HEMATOCRIT: 31 % — AB (ref 39.0–52.0)
HEMOGLOBIN: 9.9 g/dL — AB (ref 13.0–17.0)
MCH: 29.6 pg (ref 26.0–34.0)
MCHC: 31.9 g/dL (ref 30.0–36.0)
MCV: 92.8 fL (ref 78.0–100.0)
PLATELETS: 160 10*3/uL (ref 150–400)
RBC: 3.34 MIL/uL — AB (ref 4.22–5.81)
RDW: 17.1 % — AB (ref 11.5–15.5)
WBC: 5.8 10*3/uL (ref 4.0–10.5)

## 2015-06-20 LAB — C DIFFICILE QUICK SCREEN W PCR REFLEX
C DIFFICILE (CDIFF) TOXIN: NEGATIVE
C DIFFICLE (CDIFF) ANTIGEN: NEGATIVE
C Diff interpretation: NEGATIVE

## 2015-06-20 MED ORDER — SODIUM CHLORIDE 0.9 % IV SOLN
INTRAVENOUS | Status: DC
  Administered 2015-06-20 (×2): via INTRAVENOUS

## 2015-06-20 MED ORDER — CIPROFLOXACIN IN D5W 400 MG/200ML IV SOLN
400.0000 mg | Freq: Two times a day (BID) | INTRAVENOUS | Status: DC
  Administered 2015-06-20 – 2015-06-21 (×3): 400 mg via INTRAVENOUS
  Filled 2015-06-20 (×3): qty 200

## 2015-06-20 MED ORDER — METRONIDAZOLE IN NACL 5-0.79 MG/ML-% IV SOLN
500.0000 mg | Freq: Three times a day (TID) | INTRAVENOUS | Status: DC
  Administered 2015-06-20 – 2015-06-21 (×4): 500 mg via INTRAVENOUS
  Filled 2015-06-20 (×5): qty 100
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
## 2015-06-20 NOTE — Discharge Instructions (Signed)
Flexible Sigmoidoscopy °Flexible sigmoidoscopy is a procedure to check your lower colon (sigmoid colon). The procedure is done using a short, flexible tube that has a tiny camera attached (sigmoidoscope). The sigmoidoscope is inserted into your anus and passed through your rectum into your sigmoid colon. The camera on the scope sends images to a television monitor.  °You may need this exam if you have had changes in your bowel habits, bleeding from your rectum, or abdominal pain. You may also need this test if your health care provider wants to check for abnormal growths in your rectum or lower colon. °LET YOUR HEALTH CARE PROVIDER KNOW ABOUT: °· Any allergies you have. °· All medicines you are taking, including vitamins, herbs, eye drops, creams, and over-the-counter medicines. °· Previous problems you or members of your family have had with the use of anesthetics. °· Any blood disorders you have. °· Previous surgeries you have had. °· Medical conditions you have. °RISKS AND COMPLICATIONS °Generally, this is a safe procedure. However, as with any procedure, problems can occur. Possible problems include: °· Abdominal pain. °· Bleeding. °· Infection or inflammation in your colon. °· A tear through your rectum or colon (perforation). °BEFORE THE PROCEDURE °· You will need to follow a special diet and use a laxative or an enema before the procedure (bowel prep). This is to clean out your colon. You may need to start your bowel prep 1-3 days before the procedure. Follow your health care provider's instructions exactly. °· Do not eat or drink anything after midnight the night before the procedure or as directed by your health care provider. °· You may need to have a rectal suppository or enema in the morning before your procedure. °· Arrange for someone to take you home after the procedure. °PROCEDURE  °Sigmoidoscopy takes about 20 minutes and may include the following steps: °· You may get a medicine through an IV tube  to make you relaxed and drowsy (sedation). °· You will lie on your side on the examination table. °· The sigmoidoscope will be lubricated and gently inserted into your anus. °· Air will be injected into your colon and the sigmoidoscope will be moved into your sigmoid colon. You may feel some pressure or cramping. °· You may be asked to change positions at times during the procedure. °· Your health care provider will check the monitor for any abnormal findings. °· Your health care provider may also want to take a small piece of tissue to check under a microscope (biopsy). °· Your health care provider takes a final look at your sigmoid colon and rectum as the scope is slowly removed. °AFTER THE PROCEDURE  °You will stay in a recovery area for a short while until your health care provider says you can go home. °Document Released: 10/21/2000 Document Revised: 10/29/2013 Document Reviewed: 09/26/2013 °[employer]® Patient Information ©2015 [employer]. This information is not intended to replace advice given to you by your health care provider. Make sure you discuss any questions you have with your health care provider. ° °

## 2015-06-20 NOTE — Progress Notes (Signed)
Hypoglycemic Event  CBG: 65  Treatment: 15 GM carbohydrate snack, Breakfast  Symptoms: None  Follow-up CBG: Time:0830 CBG Result:101  Possible Reasons for Event: Unknown  Comments/MD notified: Rizwan, MD Daughter reports checking his CBGs at home and not seeing it below 150 in a long time.     Tamaria Dunleavy D  Remember to initiate Hypoglycemia Order Set & complete

## 2015-06-20 NOTE — Progress Notes (Signed)
[facility] Progress Note   [name] Haynes:[id number] DOB: 1929-09-07 DOA: 06/19/2015 PCP: Marylynn Pearson, MD  Brief narrative: [name] is a 81 y.o. male with severe COPD, hypertension,diabetes mellitus, who was recently admitted and treated for an Escherichia coli UTI and bacteremia and completed treatment as outpatient with Keflex on 8/2. The patient presented to the hospital for 2 days of confusion and decreased mobility. Apparently he had some right flank pain as well. There is suprapubic tenderness noted in the ER. UA was not consistent with a UTI. Temperature was 99 in the ER. Further workup included a CT scan which revealed findings consistent with acute proctitis. Further history obtained by the GI doctor after speaking with his daughter. He was having issues with fecal impaction and was in the ER last week and disimpacted. Since then he's had diarrhea with 2-3 loose bowel movements per day. The patient lives at home with his daughter and also has private sitters.  He has had multiple admissions this year, often for COPD.   Subjective: The patient is sitting up in bed eating breakfast. He has no complaints of pain or diarrhea.  Assessment/Plan: Principal Problem:   Acute encephalopathy with history of dementia - Often noted to be confused early on in his admissions-suspect this time it may secondary to an underlying infection which may also be the cause of his proctitis -has a Char[employer] fundraiser at bedside  Active Problems: Proctitis -With diarrhea after being disimpacted in the ER - Infectious? - C. difficile PCR checked due to recent use of antibiotics-this is negative -Started ciprofloxacin and Flagyl IV -Have consult to GI who will perform a flex sig tomorrow    Diabetes mellitus type 2, uncontrolled - uses metformin at home -Continue sliding scale insulin-DC Levemir she received last night-CBG was 65 this morning  Coronary artery disease and  Peripheral vascular disease - On last admission he had elevated troponin with T-wave inversions on his EKG and chest pain -Myoview last week was negative -Imaging revealed coronary calcifications and calcifications in the blood vessels supplying lower extremities and therefore Dr. Oran Rein recommended the patient be placed on aspirin and Plavix  Severe COPD - On 25 mg of prednisone daily (very slow outpatient steroid taper) -On morphine sulfate at home as well for dyspnea -Continue duo neb treatments- no exacerbation    CKD (chronic kidney disease), stage II    Chronic diastolic CHF (congestive heart failure) -Has only grade 1 diastolic dysfunction    Pressure ulcer      Code Status: DO NOT RESUSCITATE Family Communication:  Disposition Plan: Home with daughter in private caretakers DVT prophylaxis: Heparin Consultants: GI Procedures:  Antibiotics: Anti-infectives    Start     Dose/Rate Route Frequency Ordered Stop   06/20/15 1600  cefTRIAXone (ROCEPHIN) 2 g in dextrose 5 % 50 mL IVPB     2 g 100 mL/hr over 30 Minutes Intravenous Every 24 hours 06/19/15 1656     06/19/15 1945  azithromycin (ZITHROMAX) 500 mg in dextrose 5 % 250 mL IVPB  Status:  Discontinued     500 mg 250 mL/hr over 60 Minutes Intravenous  Once 06/19/15 1932 06/19/15 1949   06/19/15 1630  cefTRIAXone (ROCEPHIN) 2 g in dextrose 5 % 50 mL IVPB     2 g 100 mL/hr over 30 Minutes Intravenous  Once 06/19/15 1628 06/19/15 2014      Objective: Filed Weights   06/19/15 1713 06/19/15 2126  Weight: 58.968 kg (130 lb) 57.5  kg (126 lb 12.2 oz)    Intake/Output Summary (Last 24 hours) at 06/20/15 0735 Last data filed at 06/19/15 2300  Gross per 24 hour  Intake    120 ml  Output      1 ml  Net    119 ml     Vitals Filed Vitals:   06/19/15 2000 06/19/15 2126 06/19/15 2235 06/20/15 0512  BP: 142/64 156/61  128/61  Pulse: 67 66  65  Temp:  97.7 F (36.5 C)  98 F (36.7 C)  TempSrc:  Oral  Oral  Resp: 21 22   20   Height:  5\' 4"  (1.626 m)    Weight:  57.5 kg (126 lb 12.2 oz)    SpO2: 100% 100% 99% 100%    Exam:  General:  Pt is alert, confused to time and place, not in acute distress- sitter at bedside  HEENT: No icterus, No thrush, oral mucosa moist  Cardiovascular: regular rate and rhythm, S1/S2 No murmur  Respiratory: clear to auscultation bilaterally   Abdomen: Soft, +Bowel sounds, non tender, non distended, no guarding  MSK: No LE edema, cyanosis or clubbing  Data Reviewed: Basic Metabolic Panel:  Recent Labs Lab 06/19/15 1649 06/20/15 0436  NA 137 140  K 4.9 4.2  CL 103 107  CO2 24 25  GLUCOSE 321* 96  BUN 21* 18  CREATININE 0.96 0.83  CALCIUM 8.9 8.7*   Liver Function Tests:  Recent Labs Lab 06/19/15 1649  AST 24  ALT 17  ALKPHOS 57  BILITOT 0.7  PROT 6.4*  ALBUMIN 3.4*   No results for input(s): LIPASE, AMYLASE in the last 168 hours. No results for input(s): AMMONIA in the last 168 hours. CBC:  Recent Labs Lab 06/19/15 1649 06/20/15 0436  WBC 5.5 5.8  NEUTROABS 5.1  --   HGB 11.7* 9.9*  HCT 35.4* 31.0*  MCV 93.7 92.8  PLT 194 160   Cardiac Enzymes: No results for input(s): CKTOTAL, CKMB, CKMBINDEX, TROPONINI in the last 168 hours. BNP (last 3 results)  Recent Labs  04/17/15 2150 04/23/15 0022 04/28/15 1502  BNP 653.6* 464.5* 304.9*    ProBNP (last 3 results) No results for input(s): PROBNP in the last 8760 hours.  CBG:  Recent Labs Lab 06/19/15 2215  GLUCAP 157*    No results found for this or any previous visit (from the past 240 hour(s)).   Studies: Ct Abdomen Pelvis W Contrast  06/19/2015   CLINICAL DATA:  Recurring urinary tract infection. Suprapubic tenderness. Right flank pain and tenderness. Confusion over the last 2 days.  EXAM: CT ABDOMEN AND PELVIS WITH CONTRAST  TECHNIQUE: Multidetector CT imaging of the abdomen and pelvis was performed using the standard protocol following bolus administration of intravenous  contrast.  CONTRAST:  150mL OMNIPAQUE IOHEXOL 300 MG/ML  SOLN  COMPARISON:  CT pelvis 05/24/2015. Abdominal radiographs 06/12/2015.  FINDINGS: Bilateral pleural effusions are present. There is associated atelectasis. A granuloma is present at the right lung base. Right lower lobe and right middle lobe bronchiectasis is evident.  The heart size is normal. Coronary artery calcifications are present. A small pericardial effusion is noted. Calcified paraesophageal lymph nodes are present.  The liver and spleen are within normal limits. Dense calcifications are present throughout the splenic artery. The stomach, duodenum, and pancreas are within normal limits. The common bile duct is within normal limits. A 13 mm gallstone is present. There is no focal inflammation to suggest cholecystitis.  The adrenal glands are normal bilaterally.  A calcified mass at the upper pole of the right kidney measures 2.0 x 1.1 cm without significant change. Multiple bilateral cysts are present in both kidneys. There is no hydronephrosis. The prostate gland is irregular and extends into the base of the urinary bladder. Cephalo caudad dimension is 5.4 cm.  Edematous changes surround the rectum. The rectum is less distended than on the prior exam. The more proximal sigmoid colon is within normal limits. The remainder the colon is unremarkable. The appendix is surgically absent. Small bowel is within normal limits. No significant adenopathy or free fluid is present.  Dense atherosclerotic calcifications are present in the abdominal aorta and branch vessels without aneurysm.  The bone windows demonstrate fusion of the SI joints bilaterally. No focal lytic or blastic lesions are present.  IMPRESSION: 1. Inflammatory changes about the rectum compatible with acute proctitis. 2. Enlarged and irregular prostate gland.  This is stable. 3. Extensive atherosclerotic changes. 4. Stable calcification in tip pole of the right kidney. 5. Progressive cystic  changes in both kidneys. 6. Fusion of the SI joints. 7. Cholelithiasis without cholecystitis.   Electronically Signed   By: San Morelle M.D.   On: 06/19/2015 23:52   Dg Chest Portable 1 View  06/19/2015   CLINICAL DATA:  At sepsis.  CVD.  EXAM: PORTABLE CHEST - 1 VIEW  COMPARISON:  Acute abdominal series 06/12/2015. CT of the chest 04/23/2015.  FINDINGS: The heart is mildly enlarged. Extensive calcified mediastinal and hilar adenopathy is again noted. The left pleural effusion and basilar airspace disease has increased. A small right pleural effusion is present. Coronary artery calcification or stents are evident.  IMPRESSION: 1. Increasing left pleural effusion and basilar airspace disease. While this may represent atelectasis, infection is also considered. 2. Stable diffuse calcified adenopathy.   Electronically Signed   By: San Morelle M.D.   On: 06/19/2015 18:48    Scheduled Meds:  Scheduled Meds: . aspirin  81 mg Oral q1800  . cefTRIAXone (ROCEPHIN)  IV  2 g Intravenous Q24H  . clopidogrel  75 mg Oral Daily  . docusate sodium  100 mg Oral BID  . feeding supplement (GLUCERNA SHAKE)  237 mL Oral TID BM  . heparin  5,000 Units Subcutaneous 3 times per day  . insulin aspart  0-9 Units Subcutaneous TID WC  . insulin detemir  10 Units Subcutaneous QHS  . ipratropium-albuterol  3 mL Nebulization BID  . pantoprazole sodium  20 mg Oral Daily  . predniSONE  20 mg Oral Q breakfast  . predniSONE  5 mg Oral Q breakfast  . saccharomyces boulardii  250 mg Oral BID  . sertraline  50 mg Oral QHS  . tamsulosin  0.4 mg Oral Daily   Continuous Infusions:   Time spent on care of this patient: 35 min   [place], MD 06/20/2015, 7:35 AM  LOS: 1 day   [facility] Office  [phone] Pager - Text Page per [email or web address] If 7PM-7AM, please contact night-coverage [email or web address]

## 2015-06-21 ENCOUNTER — Encounter (HOSPITAL_COMMUNITY): Payer: Self-pay | Admitting: *Deleted

## 2015-06-21 ENCOUNTER — Encounter (HOSPITAL_COMMUNITY)
Admission: EM | Disposition: A | Discharge status: Home Health | Payer: Self-pay | Source: Home / Self Care | Attending: Internal Medicine

## 2015-06-21 DIAGNOSIS — I10 Essential (primary) hypertension: Secondary | ICD-10-CM

## 2015-06-21 DIAGNOSIS — R339 Retention of urine, unspecified: Secondary | ICD-10-CM

## 2015-06-21 HISTORY — PX: FLEXIBLE SIGMOIDOSCOPY: SHX5431

## 2015-06-21 LAB — CBC
HEMATOCRIT: 29.5 % — AB (ref 39.0–52.0)
Hemoglobin: 9.7 g/dL — ABNORMAL LOW (ref 13.0–17.0)
MCH: 30.7 pg (ref 26.0–34.0)
MCHC: 32.9 g/dL (ref 30.0–36.0)
MCV: 93.4 fL (ref 78.0–100.0)
Platelets: 156 10*3/uL (ref 150–400)
RBC: 3.16 MIL/uL — AB (ref 4.22–5.81)
RDW: 17.5 % — ABNORMAL HIGH (ref 11.5–15.5)
WBC: 5 10*3/uL (ref 4.0–10.5)

## 2015-06-21 LAB — GLUCOSE, CAPILLARY
GLUCOSE-CAPILLARY: 132 mg/dL — AB (ref 65–99)
GLUCOSE-CAPILLARY: 267 mg/dL — AB (ref 65–99)
GLUCOSE-CAPILLARY: 201 mg/dL — AB (ref 65–99)
Glucose-Capillary: 219 mg/dL — ABNORMAL HIGH (ref 65–99)

## 2015-06-21 LAB — URINE CULTURE

## 2015-06-21 SURGERY — SIGMOIDOSCOPY, FLEXIBLE
Anesthesia: Moderate Sedation

## 2015-06-21 NOTE — Progress Notes (Signed)
Report received from [place], [place]. I agree with assessment. Will continue to monitor pt. Carnella [place] I

## 2015-06-21 NOTE — Interval H&P Note (Signed)
History and Physical Interval Note:  06/21/2015 10:19 AM  [name]  has presented today for surgery, with the diagnosis of Proctitis  The various methods of treatment have been discussed with the patient and family. After consideration of risks, benefits and other options for treatment, the patient has consented to  Procedure(s): FLEXIBLE SIGMOIDOSCOPY (N/A) as a surgical intervention .  The patient's history has been reviewed, patient examined, no change in status, stable for surgery.  I have reviewed the patient's chart and labs.  Questions were answered to the patient's satisfaction.     [name]

## 2015-06-21 NOTE — Op Note (Signed)
[facility] [place], [address]   FLEX SIGMOIDOSCOPY PROCEDURE REPORT  PATIENT: [name], [name]  MR#: [id number] BIRTHDATE: [birth year] , 86  yrs. old GENDER: male ENDOSCOPIST: Carol Ada, MD REFERRED BY: PROCEDURE DATE:  06-22-15 PROCEDURE:   Sigmoidoscopy, diagnostic INDICATIONS: Proctitis on the CT scan and diarrhea MEDICATIONS: None  DESCRIPTION OF PROCEDURE:    Physical exam was performed.  Informed consent was obtained from the patient after explaining the benefits, risks, and alternatives to procedure.  The patient was connected to monitor and placed in left lateral position. Continuous oxygen was provided by nasal cannula and IV medicine administered through an indwelling cannula.  After administration of sedation and rectal exam, the patients rectum was intubated and the     colonoscope was advanced under direct visualization to the cecum.  The scope was removed slowly by carefully examining the color, texture, anatomy, and integrity mucosa on the way out.  The patient was recovered in endoscopy and discharged home in satisfactory condition. Estimated blood loss is zero unless otherwise noted in this procedure report.    FINDINGS: Upon entry into the unpreppred rectum there was thick liquid stools. A significant amout of the stool was able to be washed off the rectal mucosa.  A small erosion was identified, but the rest of the rectal mucosa was normal.  No evidence of any luminal abnormalities to correlate with the proctitis noted on the CT scan.  The small erosion detected was most likely from the recent fecal impaction one week ago.    No abnormalities noted in the sigmoid colon.  PREP QUALITY: Adequate.  COMPLICATIONS: None  ENDOSCOPIC IMPRESSION: 1) Small healing erosion in the rectum most likely from the recent fecal impaction.  RECOMMENDATIONS: 1) High fiber diet to help control the diarrhea. 2) Anti-diarrheal use  PRN/sparingly.      _______________________________ eSignedCarol Ada, MD 06-22-2015 9:32 AM   CPT CODES: ICD CODES:  The ICD and CPT codes recommended by this software are interpretations from the data that the clinical staff has captured with the software.  The verification of the translation of this report to the ICD and CPT codes and modifiers is the sole responsibility of the health care institution and practicing physician where this report was generated.  [employer] will not be held responsible for the validity of the ICD and CPT codes included on this report.  AMA assumes no liability for data contained or not contained herein. CPT is a Designer, television/film set of the [employer].

## 2015-06-21 NOTE — Progress Notes (Addendum)
[facility] Progress Note   [name] OTL:[id number] DOB: [birth year] DOA: 06/19/2015 PCP: Marylynn Pearson, MD  Brief narrative: [name] is a 81 y.o. male with severe COPD, hypertension,diabetes mellitus, who was recently admitted and treated for an Escherichia coli UTI and bacteremia and completed treatment as outpatient with Keflex on 8/2.  The patient presented to the hospital for 2 days of confusion and decreased mobility. Apparently he had some right flank pain as well. There is suprapubic tenderness noted in the ER. UA was not consistent with a UTI. Temperature was 99 in the ER. Further workup included a CT scan which revealed findings consistent with acute proctitis. Further history obtained by the GI doctor after speaking with his daughter. He was having issues with fecal impaction and was in the ER last week and disimpacted. Since then he's had diarrhea with 2-3 loose bowel movements per day. The patient lives at home with his daughter and also has private sitters.  He has had multiple admissions this year, often for COPD.   Subjective: Evaluated the patient this morning. He was sitting up in bed. Pleasant. POA who is a friend of the family is at bedside and tells me that his symptoms of suprapubic pain and confusion improved after his bladder was catheterized in the ER and 700 mL of urine was removed.  Assessment/Plan: Principal Problem:   Acute encephalopathy with history of dementia - Due to history obtained from POA which mentions that confusion improved after bladder catheterization, confusion was likely secondary to discomfort from bladder distention -has a Charity fundraiser at bedside  Active Problems: Proctitis -With diarrhea after being disimpacted in the ER - C. difficile PCR checked due to recent use of antibiotics-this is negative -Started ciprofloxacin and Flagyl IV - a flex sig performed today and no signs of proctitis noted-small healing erosion  in the rectum found may be related to recent fecal impaction -Will stop ciprofloxacin and Flagyl today  Urinary retention - History of prostate cancer in the past -700 mL obtained in ER -Although he has a condom cath and still has urine output-noted to be tender in the suprapubic area on my exam-I asked for a bladder scan to be repeated-showed greater than 500 mL in the bladder-I and O cath resulted in about 600 mL of urine -will place Foley catheter and make an appointment to follow-up with urology later this week -Already on Flomax -This is likely the cause of his frequent urinary tract infections-note UA was free of infection on this admission    Diabetes mellitus type 2, uncontrolled - uses metformin at home -Continue sliding scale insulin-DC Levemir she received on the night of admission and resulted in hypoglycemia-he does not take it at home -Follow sugars  Coronary artery disease and Peripheral vascular disease - On last admission he had elevated troponin with T-wave inversions on his EKG and chest pain -Myoview last week was negative -Imaging revealed coronary calcifications and calcifications in the blood vessels supplying lower extremities and therefore Dr. Oran Rein recommended the patient be placed on aspirin and Plavix  Severe COPD - On 25 mg of prednisone daily (per notes, he is on a very slow outpatient steroid taper since his pneumonia in June??) -On morphine sulfate at home as well for dyspnea -Continue duo neb treatments- no exacerbation    CKD (chronic kidney disease), stage II    Chronic diastolic CHF (congestive heart failure) -Has only grade 1 diastolic dysfunction    Pressure ulcer  Code Status: DO NOT RESUSCITATE Family Communication:  Disposition Plan: Home with POA and private caretakers tomorrow DVT prophylaxis: Heparin Consultants: GI Procedures: Flexible sigmoidoscopy  Antibiotics: Anti-infectives    Start     Dose/Rate Route Frequency Ordered  Stop   06/20/15 1600  cefTRIAXone (ROCEPHIN) 2 g in dextrose 5 % 50 mL IVPB  Status:  Discontinued     2 g 100 mL/hr over 30 Minutes Intravenous Every 24 hours 06/19/15 1656 06/20/15 0737   06/20/15 0800  ciprofloxacin (CIPRO) IVPB 400 mg     400 mg 200 mL/hr over 60 Minutes Intravenous Every 12 hours 06/20/15 0737     06/20/15 0800  metroNIDAZOLE (FLAGYL) IVPB 500 mg     500 mg 100 mL/hr over 60 Minutes Intravenous Every 8 hours 06/20/15 0737     06/19/15 1945  azithromycin (ZITHROMAX) 500 mg in dextrose 5 % 250 mL IVPB  Status:  Discontinued     500 mg 250 mL/hr over 60 Minutes Intravenous  Once 06/19/15 1932 06/19/15 1949   06/19/15 1630  cefTRIAXone (ROCEPHIN) 2 g in dextrose 5 % 50 mL IVPB     2 g 100 mL/hr over 30 Minutes Intravenous  Once 06/19/15 1628 06/19/15 2014      Objective: Filed Weights   06/19/15 1713 06/19/15 2126  Weight: 58.968 kg (130 lb) 57.5 kg (126 lb 12.2 oz)    Intake/Output Summary (Last 24 hours) at 06/21/15 1205 Last data filed at 06/21/15 1100  Gross per 24 hour  Intake 1560.33 ml  Output   1000 ml  Net 560.33 ml     Vitals Filed Vitals:   06/21/15 0910 06/21/15 0915 06/21/15 0920 06/21/15 0925  BP: 132/51  126/41   Pulse: 81 88 82 84  Temp: 98 F (36.7 C)     TempSrc: Oral     Resp: 22 23 22 14   Height:      Weight:      SpO2: 98% 91% 100% 100%    Exam:  General:  Pt is alert, confused to time and place, not in acute distress- sitter at bedside  HEENT: No icterus, No thrush, oral mucosa moist  Cardiovascular: regular rate and rhythm, S1/S2 No murmur  Respiratory: clear to auscultation bilaterally   Abdomen: Soft, +Bowel sounds, tender in suprapubic area today prior to and and out cath, non distended, no guarding  MSK: No LE edema, cyanosis or clubbing  Data Reviewed: Basic Metabolic Panel:  Recent Labs Lab 06/19/15 1649 06/20/15 0436  NA 137 140  K 4.9 4.2  CL 103 107  CO2 24 25  GLUCOSE 321* 96  BUN 21* 18   CREATININE 0.96 0.83  CALCIUM 8.9 8.7*   Liver Function Tests:  Recent Labs Lab 06/19/15 1649  AST 24  ALT 17  ALKPHOS 57  BILITOT 0.7  PROT 6.4*  ALBUMIN 3.4*   No results for input(s): LIPASE, AMYLASE in the last 168 hours. No results for input(s): AMMONIA in the last 168 hours. CBC:  Recent Labs Lab 06/19/15 1649 06/20/15 0436 06/21/15 0500  WBC 5.5 5.8 5.0  NEUTROABS 5.1  --   --   HGB 11.7* 9.9* 9.7*  HCT 35.4* 31.0* 29.5*  MCV 93.7 92.8 93.4  PLT 194 160 156   Cardiac Enzymes: No results for input(s): CKTOTAL, CKMB, CKMBINDEX, TROPONINI in the last 168 hours. BNP (last 3 results)  Recent Labs  04/17/15 2150 04/23/15 0022 04/28/15 1502  BNP 653.6* 464.5* 304.9*    ProBNP (  last 3 results) No results for input(s): PROBNP in the last 8760 hours.  CBG:  Recent Labs Lab 06/20/15 0825 06/20/15 1235 06/20/15 1718 06/20/15 2120 06/21/15 0748  GLUCAP 101* 237* 220* 167* 132*    Recent Results (from the past 240 hour(s))  Blood Culture (routine x 2)     Status: None (Preliminary result)   Collection Time: 06/19/15  4:49 PM  Result Value Ref Range Status   Specimen Description BLOOD RIGHT FOREARM  Final   Special Requests BOTTLES DRAWN AEROBIC AND ANAEROBIC 4.5CC  Final   Culture   Final    NO GROWTH < 24 HOURS Performed at [facility]    Report Status PENDING  Incomplete  Blood Culture (routine x 2)     Status: None (Preliminary result)   Collection Time: 06/19/15  5:07 PM  Result Value Ref Range Status   Specimen Description BLOOD RIGHT HAND  Final   Special Requests BOTTLES DRAWN AEROBIC ONLY 3CC  Final   Culture   Final    NO GROWTH < 24 HOURS Performed at [facility]    Report Status PENDING  Incomplete  Urine culture     Status: None (Preliminary result)   Collection Time: 06/19/15  6:37 PM  Result Value Ref Range Status   Specimen Description URINE, CATHETERIZED  Final   Special Requests NONE  Final   Culture    Final    NO GROWTH < 24 HOURS Performed at [facility]    Report Status PENDING  Incomplete  C difficile quick scan w PCR reflex     Status: None   Collection Time: 06/20/15 11:00 AM  Result Value Ref Range Status   C Diff antigen NEGATIVE NEGATIVE Final   C Diff toxin NEGATIVE NEGATIVE Final   C Diff interpretation Negative for toxigenic C. difficile  Final     Studies: Ct Abdomen Pelvis W Contrast  06/19/2015   CLINICAL DATA:  Recurring urinary tract infection. Suprapubic tenderness. Right flank pain and tenderness. Confusion over the last 2 days.  EXAM: CT ABDOMEN AND PELVIS WITH CONTRAST  TECHNIQUE: Multidetector CT imaging of the abdomen and pelvis was performed using the standard protocol following bolus administration of intravenous contrast.  CONTRAST:  176mL OMNIPAQUE IOHEXOL 300 MG/ML  SOLN  COMPARISON:  CT pelvis 05/24/2015. Abdominal radiographs 06/12/2015.  FINDINGS: Bilateral pleural effusions are present. There is associated atelectasis. A granuloma is present at the right lung base. Right lower lobe and right middle lobe bronchiectasis is evident.  The heart size is normal. Coronary artery calcifications are present. A small pericardial effusion is noted. Calcified paraesophageal lymph nodes are present.  The liver and spleen are within normal limits. Dense calcifications are present throughout the splenic artery. The stomach, duodenum, and pancreas are within normal limits. The common bile duct is within normal limits. A 13 mm gallstone is present. There is no focal inflammation to suggest cholecystitis.  The adrenal glands are normal bilaterally. A calcified mass at the upper pole of the right kidney measures 2.0 x 1.1 cm without significant change. Multiple bilateral cysts are present in both kidneys. There is no hydronephrosis. The prostate gland is irregular and extends into the base of the urinary bladder. Cephalo caudad dimension is 5.4 cm.  Edematous changes surround  the rectum. The rectum is less distended than on the prior exam. The more proximal sigmoid colon is within normal limits. The remainder the colon is unremarkable. The appendix is surgically absent. Small  bowel is within normal limits. No significant adenopathy or free fluid is present.  Dense atherosclerotic calcifications are present in the abdominal aorta and branch vessels without aneurysm.  The bone windows demonstrate fusion of the SI joints bilaterally. No focal lytic or blastic lesions are present.  IMPRESSION: 1. Inflammatory changes about the rectum compatible with acute proctitis. 2. Enlarged and irregular prostate gland.  This is stable. 3. Extensive atherosclerotic changes. 4. Stable calcification in tip pole of the right kidney. 5. Progressive cystic changes in both kidneys. 6. Fusion of the SI joints. 7. Cholelithiasis without cholecystitis.   Electronically Signed   By: San Morelle M.D.   On: 06/19/2015 23:52   Dg Chest Portable 1 View  06/19/2015   CLINICAL DATA:  At sepsis.  CVD.  EXAM: PORTABLE CHEST - 1 VIEW  COMPARISON:  Acute abdominal series 06/12/2015. CT of the chest 04/23/2015.  FINDINGS: The heart is mildly enlarged. Extensive calcified mediastinal and hilar adenopathy is again noted. The left pleural effusion and basilar airspace disease has increased. A small right pleural effusion is present. Coronary artery calcification or stents are evident.  IMPRESSION: 1. Increasing left pleural effusion and basilar airspace disease. While this may represent atelectasis, infection is also considered. 2. Stable diffuse calcified adenopathy.   Electronically Signed   By: San Morelle M.D.   On: 06/19/2015 18:48    Scheduled Meds:  Scheduled Meds: . aspirin  81 mg Oral q1800  . ciprofloxacin  400 mg Intravenous Q12H  . clopidogrel  75 mg Oral Daily  . feeding supplement (GLUCERNA SHAKE)  237 mL Oral TID BM  . heparin  5,000 Units Subcutaneous 3 times per day  . insulin  aspart  0-9 Units Subcutaneous TID WC  . ipratropium-albuterol  3 mL Nebulization BID  . metronidazole  500 mg Intravenous Q8H  . pantoprazole sodium  20 mg Oral Daily  . predniSONE  20 mg Oral Q breakfast  . predniSONE  5 mg Oral Q breakfast  . saccharomyces boulardii  250 mg Oral BID  . sertraline  50 mg Oral QHS  . tamsulosin  0.4 mg Oral Daily   Continuous Infusions:   Time spent on care of this patient: 35 min   Kirby, MD 06/21/2015, 12:05 PM  LOS: 2 days   [facility] Office  [phone] Pager - Text Page per [email or web address] If 7PM-7AM, please contact night-coverage [email or web address]
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
## 2015-06-21 NOTE — Progress Notes (Signed)
Initial Nutrition Assessment  DOCUMENTATION CODES:   Non-severe (moderate) malnutrition in context of chronic illness  INTERVENTION:   Continue Glucerna Shake po TID, each supplement provides 220 kcal and 10 grams of protein Encourage PO intake RD to continue to monitor  NUTRITION DIAGNOSIS:   Increased nutrient needs related to wound healing as evidenced by estimated needs.  GOAL:   Patient will meet greater than or equal to 90% of their needs  MONITOR:   PO intake, Supplement acceptance, Labs, Weight trends, Skin, I & O's  REASON FOR ASSESSMENT:   Malnutrition Screening Tool    ASSESSMENT:   81 y.o. male with severe COPD, hypertension,diabetes mellitus, who was recently admitted and treated for an Escherichia coli UTI and bacteremia .Marland KitchenMarland KitchenThe patient presented to the hospital for 2 days of confusion and decreased mobility.   Pt in room with caretaker at bedside. Pt confused at time of visit. Caretaker at bedside unable to provide much history as it was her first day with him. PO intake: 75% of CHO modified diet. Per weight history documentation, pt has lost 20 lb since 4/05 (14% weight loss x 4 months, significant for time frame).  Pt reports he has been drinking 2-4 Glucerna shakes a day at home. Glucerna shakes already ordered.  Nutrition-Focused physical exam completed. Findings are mild fat depletion, moderate muscle depletion, and no edema.   Labs reviewed: CBGs: 132-267  Diet Order:  Diet Carb Modified Fluid consistency:: Thin; Room service appropriate?: Yes  Skin:  Wound (see comment) (stage II coccyx pressure ulcer)  Last BM:  8/13  Height:   Ht Readings from Last 1 Encounters:  06/19/15 5\' 4"  (1.626 m)    Weight:   Wt Readings from Last 1 Encounters:  06/19/15 126 lb 12.2 oz (57.5 kg)    Ideal Body Weight:  59.1 kg  BMI:  Body mass index is 21.75 kg/(m^2).  Estimated Nutritional Needs:   Kcal:  1500-1700  Protein:  85-95g  Fluid:   1.6L/day  EDUCATION NEEDS:   No education needs identified at this time  Clayton Bibles, MS, RD, LDN Pager: [phone] After Hours Pager: [phone]

## 2015-06-22 ENCOUNTER — Encounter (HOSPITAL_COMMUNITY): Payer: Self-pay | Admitting: Gastroenterology

## 2015-06-22 DIAGNOSIS — E440 Moderate protein-calorie malnutrition: Secondary | ICD-10-CM | POA: Insufficient documentation

## 2015-06-22 DIAGNOSIS — J439 Emphysema, unspecified: Secondary | ICD-10-CM

## 2015-06-22 DIAGNOSIS — K6289 Other specified diseases of anus and rectum: Secondary | ICD-10-CM

## 2015-06-22 DIAGNOSIS — G9340 Encephalopathy, unspecified: Principal | ICD-10-CM

## 2015-06-22 LAB — BASIC METABOLIC PANEL
ANION GAP: 8 (ref 5–15)
BUN: 25 mg/dL — AB (ref 6–20)
CHLORIDE: 105 mmol/L (ref 101–111)
CO2: 24 mmol/L (ref 22–32)
Calcium: 8.4 mg/dL — ABNORMAL LOW (ref 8.9–10.3)
Creatinine, Ser: 1.01 mg/dL (ref 0.61–1.24)
GFR calc Af Amer: >60 mL/min (ref >60)
Glucose, Bld: 203 mg/dL — ABNORMAL HIGH (ref 65–99)
POTASSIUM: 4.4 mmol/L (ref 3.5–5.1)
SODIUM: 137 mmol/L (ref 135–145)

## 2015-06-22 LAB — GLUCOSE, CAPILLARY
GLUCOSE-CAPILLARY: 245 mg/dL — AB (ref 65–99)
Glucose-Capillary: 154 mg/dL — ABNORMAL HIGH (ref 65–99)
Glucose-Capillary: 339 mg/dL — ABNORMAL HIGH (ref 65–99)
Glucose-Capillary: 334 mg/dL — ABNORMAL HIGH (ref 65–99)

## 2015-06-22 LAB — CBC
HEMATOCRIT: 31.8 % — AB (ref 39.0–52.0)
HEMOGLOBIN: 10.2 g/dL — AB (ref 13.0–17.0)
MCH: 29.7 pg (ref 26.0–34.0)
MCHC: 32.1 g/dL (ref 30.0–36.0)
MCV: 92.7 fL (ref 78.0–100.0)
Platelets: 159 10*3/uL (ref 150–400)
RBC: 3.43 MIL/uL — ABNORMAL LOW (ref 4.22–5.81)
RDW: 16.8 % — AB (ref 11.5–15.5)
WBC: 9.4 10*3/uL (ref 4.0–10.5)

## 2015-06-22 MED ORDER — SODIUM CHLORIDE 0.9 % IV SOLN: INTRAVENOUS | Status: DC

## 2015-06-22 MED ORDER — SODIUM CHLORIDE 0.9 % IV SOLN
INTRAVENOUS | Status: DC
Start: 2015-06-22 — End: 2015-06-26
  Administered 2015-06-22 – 2015-06-24 (×4): via INTRAVENOUS

## 2015-06-22 MED ORDER — POLYETHYLENE GLYCOL 3350 17 G PO PACK: 17.0000 g | PACK | Freq: Every day | ORAL | Status: DC | PRN

## 2015-06-22 MED ORDER — LACTULOSE 10 GM/15ML PO SOLN: 10.0000 g | Freq: Two times a day (BID) | ORAL | Status: DC | PRN

## 2015-06-22 NOTE — Discharge Summary (Addendum)
Physician Discharge Summary  [name] DJT:[id number] DOB: November 14, 1928 DOA: 06/19/2015  PCP: Marylynn Pearson, MD  Admit date: 06/19/2015 Discharge date: 06/22/2015  Time spent: 55 minutes  Recommendations for Outpatient Follow-up:  1. Urology appt later this week  Discharge Condition: stable Diet recommendation: diabeticd  Discharge Diagnoses:  Principal Problem:   Acute encephalopathy Active Problems:   Diabetes mellitus type 2, uncontrolled   Essential hypertension   CKD (chronic kidney disease), stage II   COPD (chronic obstructive pulmonary disease)   Chronic diastolic CHF (congestive heart failure)   Pressure ulcer   Proctitis   Malnutrition of moderate degree   History of present illness:  [name] is a 81 y.o. male with severe COPD, hypertension,diabetes mellitus, who was recently admitted and treated for an Escherichia coli UTI and bacteremia and completed treatment as outpatient with Keflex on 8/2.  The patient presented to the hospital for 2 days of confusion and decreased mobility. Apparently he had some right flank pain as well. There is suprapubic tenderness noted in the ER. UA was not consistent with a UTI. Temperature was 99 in the ER.   Further workup included a CT scan which revealed findings consistent with acute proctitis. Further history obtained by the GI doctor after speaking with his POA/ gaurdian. He was having issues with fecal impaction and was in the ER last week and disimpacted. Since then he's had diarrhea with 2-3 loose bowel movements per day. The patient lives at home with his [place] and also has private sitters.  He has had multiple admissions this year, often for COPD.  Hospital Course:  Principal Problem:  Acute encephalopathy with history of dementia - Due to history obtained from POA which mentions that confusion improved after bladder catheterization, confusion was likely secondary to discomfort from bladder  distention -has a Charity fundraiser at bedside  Active Problems:  Proctitis?? -With diarrhea after being disimpacted in the ER - C. difficile PCR checked due to recent use of antibiotics-this is negative -Started ciprofloxacin and Flagyl IV - a flex sig performed 8/14 with NO signs of proctitis noted-small healing erosion in the rectum found may be related to recent fecal impaction - stopped ciprofloxacin and Flagyl on 8/14- no diarrhea in the hospital  Urinary retention - History of prostate cancer in the past -700 mL obtained in ER -Although he has a condom cath and still has urine output-noted to be tender in the suprapubic area on my exam-I asked for a bladder scan to be repeated-showed greater than 500 mL in the bladder-I and O cath resulted in about 600 mL of urine - placed Foley catheter on 8/14- will make an appointment to follow-up with urology later this week -Already on Flomax -This is likely the cause of his frequent urinary tract infections-note UA was free of infection on this admission   Diabetes mellitus type 2, uncontrolled - uses metformin at home- can continue  Coronary artery disease and Peripheral vascular disease - On last admission he had elevated troponin with T-wave inversions on his EKG and chest pain -Myoview last week was negative -Imaging revealed coronary calcifications and calcifications in the blood vessels supplying lower extremities and therefore Dr. Oran Rein recommended the patient be placed on aspirin and Plavix  Severe COPD - On 25 mg of prednisone daily (per notes, he is on a very slow outpatient steroid taper since his pneumonia in June??) -On morphine sulfate at home as well for dyspnea -Continue duo neb treatments- no exacerbation  CKD (chronic kidney disease), stage II   Chronic diastolic CHF (congestive heart failure) -Has only grade 1 diastolic dysfunction   Pressure ulcer    Above plan discussed in detail with POA.   Consultants:  GI Procedures: Flexible sigmoidoscopy   Discharge Exam: Filed Weights   06/19/15 1713 06/19/15 2126  Weight: 58.968 kg (130 lb) 57.5 kg (126 lb 12.2 oz)   Filed Vitals:   06/22/15 0418  BP: 129/60  Pulse: 81  Temp: 98.1 F (36.7 C)  Resp: 20    General: elderly man sitting in bed no distress, slightly confused, communicative Cardiovascular: RRR, no murmurs  Respiratory: clear to auscultation bilaterally GI: soft, non-tender, non-distended, bowel sound positive  Discharge Instructions You were cared for by a hospitalist during your hospital stay. If you have any questions about your discharge medications or the care you received while you were in the hospital after you are discharged, you can call the unit and asked to speak with the hospitalist on call if the hospitalist that took care of you is not available. Once you are discharged, your primary care physician will handle any further medical issues. Please note that NO REFILLS for any discharge medications will be authorized once you are discharged, as it is imperative that you return to your primary care physician (or establish a relationship with a primary care physician if you do not have one) for your aftercare needs so that they can reassess your need for medications and monitor your lab values.      Discharge Instructions    Diet - low sodium heart healthy    Complete by:  As directed      Increase activity slowly    Complete by:  As directed             Medication List    TAKE these medications        acetaminophen 325 MG tablet  Commonly known as:  TYLENOL  Take 325 mg by mouth every 6 (six) hours as needed for mild pain.     albuterol 108 (90 BASE) MCG/ACT inhaler  Commonly known as:  PROVENTIL HFA;VENTOLIN HFA  Inhale 1-2 puffs into the lungs every 6 (six) hours as needed for wheezing or shortness of breath.     aspirin 81 MG chewable tablet  Chew 81 mg by mouth daily at 6 PM.     bisacodyl 10 MG  suppository  Commonly known as:  DULCOLAX  Place 1 suppository (10 mg total) rectally as needed for moderate constipation.     clopidogrel 75 MG tablet  Commonly known as:  PLAVIX  TAKE 1 TABLET DAILY     docusate sodium 100 MG capsule  Commonly known as:  COLACE  Take 1 capsule (100 mg total) by mouth 2 (two) times daily.     feeding supplement (GLUCERNA SHAKE) Liqd  Take 237 mLs by mouth 3 (three) times daily between meals.     ipratropium-albuterol 0.5-2.5 (3) MG/3ML Soln  Commonly known as:  DUONEB  Take 3 mLs by nebulization 2 (two) times daily.     lactulose 10 GM/15ML solution  Commonly known as:  CHRONULAC  Take 15 mLs (10 g total) by mouth 2 (two) times daily as needed for moderate constipation.     metFORMIN 500 MG tablet  Commonly known as:  GLUCOPHAGE  Take 0.5 tablets (250 mg total) by mouth 2 (two) times daily with a meal.     morphine CONCENTRATE 10 MG/0.5ML Soln concentrated solution  Take 0.25 mLs (5 mg total) by mouth every hour as needed for moderate pain, severe pain or shortness of breath.     pantoprazole sodium 40 mg/20 mL Pack  Commonly known as:  PROTONIX  Take 10 mLs (20 mg total) by mouth daily.     polyethylene glycol packet  Commonly known as:  MIRALAX / GLYCOLAX  Take 17 g by mouth daily as needed for mild constipation.     predniSONE 10 MG tablet  Commonly known as:  DELTASONE  Take 5 mg by mouth daily with breakfast. Takes with 20 mg tablet to equal 25 mg daily.     predniSONE 20 MG tablet  Commonly known as:  DELTASONE  Take 1.5 tablets (30 mg total) by mouth daily with breakfast. Until you follow up with your primary care doctor.     saccharomyces boulardii 250 MG capsule  Commonly known as:  FLORASTOR  Take 1 capsule (250 mg total) by mouth 2 (two) times daily.     sertraline 50 MG tablet  Commonly known as:  ZOLOFT  Take 1 tablet (50 mg total) by mouth at bedtime.     tamsulosin 0.4 MG Caps capsule  Commonly known as:  FLOMAX   Take 1 capsule (0.4 mg total) by mouth daily.     tiotropium 18 MCG inhalation capsule  Commonly known as:  SPIRIVA HANDIHALER  Place 1 capsule (18 mcg total) into inhaler and inhale daily.       Allergies  Allergen Reactions  . Dulera [Mometasone Furo-Formoterol Fum] Other (See Comments)    Causes facial/oral/nasal burning progressing to urinary tract blockage  . Pioglitazone Cough  . Ramipril Cough   Follow-up Information    Follow up with [facility].   Specialty:  [place]   Why:  HHRN/HHPT   Contact information:   [place] [id number] [phone]        The results of significant diagnostics from this hospitalization (including imaging, microbiology, ancillary and laboratory) are listed below for reference.    Significant Diagnostic Studies: Ct Pelvis W Contrast  05/24/2015   CLINICAL DATA:  Buttock pain for 3 weeks. Decubitus ulcer over the sacrum.  EXAM: CT PELVIS WITH CONTRAST  TECHNIQUE: Multidetector CT imaging of the pelvis was performed using the standard protocol following the bolus administration of intravenous contrast.  CONTRAST:  15mL OMNIPAQUE IOHEXOL 300 MG/ML  SOLN  COMPARISON:  CT abdomen and pelvis 12/21/2004  FINDINGS: Skin defect consistent with ulceration demonstrated over the coccygeal region extending up focally over the right side of the sacrum. Mild skin thickening and infiltration in the subcutaneous fat. No discrete fluid collection to suggest abscess. No bone erosion or sclerosis to suggest osteomyelitis. Narrowing and sclerosis in the SI joints, likely degenerative. Mild degenerative changes in the hips and lower lumbar spine. No acute bony abnormalities are suggested.  Soft tissue pelvis demonstrates large amount of stool in the rectum, possibly indicating impaction. Visualized small and large bowel are not abnormally distended. No pelvic mass or lymphadenopathy. Prostate gland is not enlarged. The bladder  demonstrates posterior wall thickening with significant gas in the bladder. This may indicate cystitis although fistula could also have this appearance. No evidence of diverticulitis.  IMPRESSION: Soft tissue ulceration over the sacral coccygeal spine region with no evidence of abscess or osteomyelitis. Gas and asymmetric wall thickening in the bladder may indicate infection although colovesical fistula could also have this appearance.   Electronically Signed  By: Lucienne Capers M.D.   On: 05/24/2015 01:44   Ct Abdomen Pelvis W Contrast  06/19/2015   CLINICAL DATA:  Recurring urinary tract infection. Suprapubic tenderness. Right flank pain and tenderness. Confusion over the last 2 days.  EXAM: CT ABDOMEN AND PELVIS WITH CONTRAST  TECHNIQUE: Multidetector CT imaging of the abdomen and pelvis was performed using the standard protocol following bolus administration of intravenous contrast.  CONTRAST:  120mL OMNIPAQUE IOHEXOL 300 MG/ML  SOLN  COMPARISON:  CT pelvis 05/24/2015. Abdominal radiographs 06/12/2015.  FINDINGS: Bilateral pleural effusions are present. There is associated atelectasis. A granuloma is present at the right lung base. Right lower lobe and right middle lobe bronchiectasis is evident.  The heart size is normal. Coronary artery calcifications are present. A small pericardial effusion is noted. Calcified paraesophageal lymph nodes are present.  The liver and spleen are within normal limits. Dense calcifications are present throughout the splenic artery. The stomach, duodenum, and pancreas are within normal limits. The common bile duct is within normal limits. A 13 mm gallstone is present. There is no focal inflammation to suggest cholecystitis.  The adrenal glands are normal bilaterally. A calcified mass at the upper pole of the right kidney measures 2.0 x 1.1 cm without significant change. Multiple bilateral cysts are present in both kidneys. There is no hydronephrosis. The prostate gland is  irregular and extends into the base of the urinary bladder. Cephalo caudad dimension is 5.4 cm.  Edematous changes surround the rectum. The rectum is less distended than on the prior exam. The more proximal sigmoid colon is within normal limits. The remainder the colon is unremarkable. The appendix is surgically absent. Small bowel is within normal limits. No significant adenopathy or free fluid is present.  Dense atherosclerotic calcifications are present in the abdominal aorta and branch vessels without aneurysm.  The bone windows demonstrate fusion of the SI joints bilaterally. No focal lytic or blastic lesions are present.  IMPRESSION: 1. Inflammatory changes about the rectum compatible with acute proctitis. 2. Enlarged and irregular prostate gland.  This is stable. 3. Extensive atherosclerotic changes. 4. Stable calcification in tip pole of the right kidney. 5. Progressive cystic changes in both kidneys. 6. Fusion of the SI joints. 7. Cholelithiasis without cholecystitis.   Electronically Signed   By: San Morelle M.D.   On: 06/19/2015 23:52   Dg Chest Portable 1 View  06/19/2015   CLINICAL DATA:  At sepsis.  CVD.  EXAM: PORTABLE CHEST - 1 VIEW  COMPARISON:  Acute abdominal series 06/12/2015. CT of the chest 04/23/2015.  FINDINGS: The heart is mildly enlarged. Extensive calcified mediastinal and hilar adenopathy is again noted. The left pleural effusion and basilar airspace disease has increased. A small right pleural effusion is present. Coronary artery calcification or stents are evident.  IMPRESSION: 1. Increasing left pleural effusion and basilar airspace disease. While this may represent atelectasis, infection is also considered. 2. Stable diffuse calcified adenopathy.   Electronically Signed   By: San Morelle M.D.   On: 06/19/2015 18:48   Dg Chest Port 1 View  05/24/2015   CLINICAL DATA:  Pneumonia. 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  EXAM: PORTABLE CHEST - 1 VIEW  COMPARISON:  04/28/2015, 04/23/2015  FINDINGS: Lungs are hyperinflated. Again noted are numerous calcified hilar and mediastinal lymph nodes unchanged over prior studies. The heart is enlarged. Small left pleural effusion is stable. There are  no focal consolidations or pleural effusions.  IMPRESSION: 1. Cardiomegaly without edema. 2. Small left pleural effusion.   Electronically Signed   By: Nolon Nations M.D.   On: 05/24/2015 07:59   Dg Abd Acute W/chest  06/12/2015   CLINICAL DATA:  Pt here with abd discomfort/constipation x2 weeks. Was told he has an impaction. Has had multiple enemas recently. Denies prior surgery to abd  EXAM: DG ABDOMEN ACUTE W/ 1V CHEST  COMPARISON:  05/24/2015  FINDINGS: There is no bowel dilation to suggest obstruction. On the decubitus views, there are scattered air-fluid levels. This may reflect a mild adynamic ileus. It can be seen in setting of gastroenteritis. It may be due to the multiple recent enemas as described in history.  There is no free air.  Densities project in the right upper quadrant suggesting gallstones. There are aortoiliac and branch vessel vascular calcifications. Splenic artery calcifications are noted in the left upper quadrant. Soft tissues are otherwise unremarkable.  No acute bony abnormality.  Frontal chest radiograph demonstrates numerous calcified mediastinal and hilar lymph nodes, stable. There are areas of lung scarring and emphysema. No acute findings in the lungs. No pleural effusion or pneumothorax.  IMPRESSION: 1. No evidence of bowel obstruction or generalized adynamic ileus. No significant increased stool. 2. Nonspecific air-fluid levels within nondistended bowel on the decubitus views. This may be due to gastroenteritis or from the multiple enemas. 3. No acute cardiopulmonary disease.   Electronically Signed   By: Lajean Manes M.D.    On: 06/12/2015 17:04    Microbiology: Recent Results (from the past 240 hour(s))  Blood Culture (routine x 2)     Status: None (Preliminary result)   Collection Time: 06/19/15  4:49 PM  Result Value Ref Range Status   Specimen Description BLOOD RIGHT FOREARM  Final   Special Requests BOTTLES DRAWN AEROBIC AND ANAEROBIC 4.5CC  Final   Culture   Final    NO GROWTH 2 DAYS Performed at [facility]    Report Status PENDING  Incomplete  Blood Culture (routine x 2)     Status: None (Preliminary result)   Collection Time: 06/19/15  5:07 PM  Result Value Ref Range Status   Specimen Description BLOOD RIGHT HAND  Final   Special Requests BOTTLES DRAWN AEROBIC ONLY 3CC  Final   Culture   Final    NO GROWTH 2 DAYS Performed at [facility]    Report Status PENDING  Incomplete  Urine culture     Status: None   Collection Time: 06/19/15  6:37 PM  Result Value Ref Range Status   Specimen Description URINE, CATHETERIZED  Final   Special Requests NONE  Final   Culture   Final    20,000 COLONIES/mL YEAST Performed at [facility]    Report Status 06/21/2015 FINAL  Final  C difficile quick scan w PCR reflex     Status: None   Collection Time: 06/20/15 11:00 AM  Result Value Ref Range Status   C Diff antigen NEGATIVE NEGATIVE Final   C Diff toxin NEGATIVE NEGATIVE Final   C Diff interpretation Negative for toxigenic C. difficile  Final     Labs: Basic Metabolic Panel:  Recent Labs Lab 06/19/15 1649 06/20/15 0436 06/22/15 0800  NA 137 140 137  K 4.9 4.2 4.4  CL 103 107 105  CO2 24 25 24   GLUCOSE 321* 96 203*  BUN 21* 18 25*  CREATININE 0.96 0.83 1.01  CALCIUM 8.9 8.7*  8.4*   Liver Function Tests:  Recent Labs Lab 06/19/15 1649  AST 24  ALT 17  ALKPHOS 57  BILITOT 0.7  PROT 6.4*  ALBUMIN 3.4*   No results for input(s): LIPASE, AMYLASE in the last 168 hours. No results for input(s): AMMONIA in the last 168 hours. CBC:  Recent Labs Lab  06/19/15 1649 06/20/15 0436 06/21/15 0500 06/22/15 0800  WBC 5.5 5.8 5.0 9.4  NEUTROABS 5.1  --   --   --   HGB 11.7* 9.9* 9.7* 10.2*  HCT 35.4* 31.0* 29.5* 31.8*  MCV 93.7 92.8 93.4 92.7  PLT 194 160 156 159   Cardiac Enzymes: No results for input(s): CKTOTAL, CKMB, CKMBINDEX, TROPONINI in the last 168 hours. BNP: BNP (last 3 results)  Recent Labs  04/17/15 2150 04/23/15 0022 04/28/15 1502  BNP 653.6* 464.5* 304.9*    ProBNP (last 3 results) No results for input(s): PROBNP in the last 8760 hours.  CBG:  Recent Labs Lab 06/21/15 0748 06/21/15 1137 06/21/15 1643 06/21/15 2117 06/22/15 0730  GLUCAP 132* 267* 201* 219* 154*       SignedDebbe Odea, MD [facility] 06/22/2015, 10:57 AM

## 2015-06-22 NOTE — Progress Notes (Signed)
Patient's POA concerned that he has been sleeping all day and is uncomfortable taking him home. I have told her that we can observe overnight.   Debbe Odea, MD

## 2015-06-22 NOTE — Care Management Important Message (Signed)
Important Message  Patient Details  Name: [name] MRN: [id number] Date of Birth: Mar 17, 1929   Medicare Important Message Given:  Yes-second notification given    Shelda Altes 06/22/2015, 3:04 [place] Message  Patient Details  Name: [name] MRN: [id number] Date of Birth: 1929-07-04   Medicare Important Message Given:  Yes-second notification given    Shelda Altes 06/22/2015, 3:04 PM

## 2015-06-22 NOTE — Progress Notes (Signed)
Report no diarrhea or rectal pain/bleeding. Stool studies are negative for C. Dif and sigmoidoscopy was negative.  Needs no further GI studies/intervention.  Signing off.

## 2015-06-22 NOTE — Progress Notes (Signed)
Inpatient Diabetes Program Recommendations  AACE/ADA: New Consensus Statement on Inpatient Glycemic Control (2013)  Target Ranges:  Prepandial:   less than 140 mg/dL      Peak postprandial:   less than 180 mg/dL (1-2 hours)      Critically ill patients:  140 - 180 mg/dL    Results for AHAN, EISENBERGER (MRN [id number]) as of 06/22/2015 10:19  Ref. Range 06/21/2015 07:48 06/21/2015 11:37 06/21/2015 16:43 06/21/2015 21:17  Glucose-Capillary Latest Ref Range: 65-99 mg/dL 132 (H) 267 (H) 201 (H) 219 (H)     Admit with: AMS  History: DM, COPD, CHF, CKD  Home DM Meds: Metformin 250 mg bid  Current DM Orders: Novolog Sensitive SSI (0-9 units) TID AC     -Patient currently receiving Prednisone 25 mg daily.  -Having some elevated postprandial glucose levels likely due to Prednisone.    MD- Please consider adding low dose Novolog Meal Coverage while patient's home Metformin is on hold-  Novolog 3 units tidwc (hold if NPO, hold if pt eats <50% of meal)     Will follow Wyn Quaker RN, MSN, CDE Diabetes Coordinator Inpatient Glycemic Control Team Team Pager: [phone] (8a-5p)

## 2015-06-22 NOTE — Progress Notes (Signed)
CSW received consult for discharge planning. Per RN, Catie - patient to return home with home health (RNCM, Juliann Pulse has already made arrangements) this afternoon. Daughter to transport patient home around 4pm.   No further CSW needs identified - CSW signing off.   Raynaldo Opitz, [facility] Clinical Social Worker cell #: [phone]

## 2015-06-22 NOTE — Care Management Note (Signed)
Case Management Note  Patient Details  Name: JAMESROBERT OHANESIAN MRN: [id number] Date of Birth: 08/16/1929  Subjective/Objective:  81 y/o m admitted w/Acute encephalopathy. From home w/dtr.Active w/HHC-[facility].TC w/call back from rep Lattie Haw tel#[phone],about [place] orders, & d/c-she is aware.                   Action/Plan:d/c home w/HHC. No further d/c needs.   Expected Discharge Date:   (unknown)               Expected Discharge Plan:  [place]  In-House Referral:     Discharge planning Services  CM Consult  Post Acute Care Choice:    Choice offered to:  Adult Children  DME Arranged:    DME Agency:     HH Arranged:  RN, PT [place] Agency:  [facility]  Status of Service:  Completed, signed off  Medicare Important Message Given:    Date Medicare IM Given:    Medicare IM give by:    Date Additional Medicare IM Given:    Additional Medicare Important Message give by:     If discussed at [place] of Stay Meetings, dates discussed:    Additional Comments:  Dessa Phi, RN 06/22/2015, 10:46 AM

## 2015-06-23 ENCOUNTER — Inpatient Hospital Stay (HOSPITAL_COMMUNITY): Payer: Medicare Other

## 2015-06-23 DIAGNOSIS — J189 Pneumonia, unspecified organism: Secondary | ICD-10-CM

## 2015-06-23 DIAGNOSIS — E440 Moderate protein-calorie malnutrition: Secondary | ICD-10-CM

## 2015-06-23 LAB — BASIC METABOLIC PANEL
ANION GAP: 4 — AB (ref 5–15)
BUN: 31 mg/dL — AB (ref 6–20)
CHLORIDE: 105 mmol/L (ref 101–111)
CO2: 27 mmol/L (ref 22–32)
Calcium: 8.2 mg/dL — ABNORMAL LOW (ref 8.9–10.3)
Creatinine, Ser: 1.16 mg/dL (ref 0.61–1.24)
GFR calc Af Amer: >60 mL/min (ref >60)
GFR, EST NON AFRICAN AMERICAN: 56 mL/min — AB (ref >60)
GLUCOSE: 182 mg/dL — AB (ref 65–99)
POTASSIUM: 4.7 mmol/L (ref 3.5–5.1)
Sodium: 136 mmol/L (ref 135–145)

## 2015-06-23 LAB — CBC
HEMATOCRIT: 28.3 % — AB (ref 39.0–52.0)
HEMOGLOBIN: 9.3 g/dL — AB (ref 13.0–17.0)
MCH: 30.9 pg (ref 26.0–34.0)
MCHC: 32.9 g/dL (ref 30.0–36.0)
MCV: 94 fL (ref 78.0–100.0)
Platelets: 149 10*3/uL — ABNORMAL LOW (ref 150–400)
RBC: 3.01 MIL/uL — ABNORMAL LOW (ref 4.22–5.81)
RDW: 17.1 % — AB (ref 11.5–15.5)
WBC: 7 10*3/uL (ref 4.0–10.5)

## 2015-06-23 LAB — GLUCOSE, CAPILLARY
GLUCOSE-CAPILLARY: 333 mg/dL — AB (ref 65–99)
GLUCOSE-CAPILLARY: 157 mg/dL — AB (ref 65–99)
Glucose-Capillary: 158 mg/dL — ABNORMAL HIGH (ref 65–99)
Glucose-Capillary: 319 mg/dL — ABNORMAL HIGH (ref 65–99)

## 2015-06-23 MED ORDER — INSULIN ASPART 100 UNIT/ML ~~LOC~~ SOLN
0.0000 [IU] | Freq: Three times a day (TID) | SUBCUTANEOUS | Status: DC
  Administered 2015-06-23: 11 [IU] via SUBCUTANEOUS
  Administered 2015-06-24: 8 [IU] via SUBCUTANEOUS
  Administered 2015-06-24: 2 [IU] via SUBCUTANEOUS
  Administered 2015-06-24: 3 [IU] via SUBCUTANEOUS
  Administered 2015-06-25: 8 [IU] via SUBCUTANEOUS
  Administered 2015-06-25: 5 [IU] via SUBCUTANEOUS
  Administered 2015-06-25: 2 [IU] via SUBCUTANEOUS

## 2015-06-23 MED ORDER — DEXTROSE 5 % IV SOLN
1.0000 g | Freq: Two times a day (BID) | INTRAVENOUS | Status: DC
  Administered 2015-06-23 – 2015-06-24 (×2): 1 g via INTRAVENOUS
  Filled 2015-06-23 (×3): qty 1

## 2015-06-23 MED ORDER — INSULIN ASPART 100 UNIT/ML ~~LOC~~ SOLN
0.0000 [IU] | Freq: Every day | SUBCUTANEOUS | Status: DC
  Administered 2015-06-24: 3 [IU] via SUBCUTANEOUS
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
## 2015-06-23 NOTE — Progress Notes (Addendum)
[facility] Progress Note   [name]:[id number] DOB: 26-May-1929 DOA: 06/19/2015 PCP: Marylynn Pearson, MD  Brief narrative: [name] is a 81 y.o. male with severe COPD, hypertension,diabetes mellitus, who was recently admitted and treated for an Escherichia coli UTI and bacteremia and completed treatment as outpatient with Keflex on 8/2.  The patient presented to the hospital for 2 days of confusion and decreased mobility. Apparently he had some right flank pain as well. There is suprapubic tenderness noted in the ER. UA was not consistent with a UTI. Temperature was 99 in the ER. Further workup included a CT scan which revealed findings consistent with acute proctitis. Further history obtained by the GI doctor after speaking with his daughter. He was having issues with fecal impaction and was in the ER last week and disimpacted. Since then he's had diarrhea with 2-3 loose bowel movements per day. The patient lives at home with his daughter and also has private sitters.  He has had multiple admissions this year, often for COPD.   Subjective: Was sleeping most of the day yesterday which was quite different from his baseline per POA and could not be discharged as planned. Later noted to to be congested last night - was coughing when eating- much more congested this AM and continues to cough on food.  Assessment/Plan: Principal Problem:   Acute encephalopathy with history of dementia - Due to history obtained from POA which mentions that confusion improved after bladder catheterization, confusion was likely secondary to discomfort from bladder distention -has a Charity fundraiser at bedside  Active Problems: Proctitis? -With diarrhea after being disimpacted in the ER - C. difficile PCR checked due to recent use of antibiotics-this is negative -Started ciprofloxacin and Flagyl IV - a flex sig performed  and no signs of proctitis noted-small healing erosion in the rectum  found may be related to recent fecal impaction -stopped ciprofloxacin and Flagyl on 8/14  Urinary retention - History of prostate cancer in the past -700 mL obtained in ER -Although he has a condom cath and still has urine output-noted to be tender in the suprapubic area on my exam-I asked for a bladder scan to be repeated-showed greater than 500 mL in the bladder-I and O cath resulted in about 600 mL of urine -  placed Foley catheter on 8/14 and made an appointment to follow-up with urology later this week - it is for tomorrow- will need to reschedule as he will be here tomorrow -Already on Flomax -This is likely the cause of his frequent urinary tract infections-note UA was free of infection on this admission  New RLL infiltrate / cough with eating -  May have had a pneumonia on admission ( B/l infiltrates on admission CXR)  - POA says he was lethargic similar to how he was yesterday- Cipro and Flagyl was given on admision for possible proctitis and he improved considerably- will start Hydro for HCAP (cefepime not available)- doubt MRSA therefore will not start Vanc - SLP eval today- per POA, he has had an SLP eval on his recent admissions and was told he was intermittently aspirating - was treated for PNA on early June admission    Diabetes mellitus type 2, uncontrolled - uses metformin at home -Continue sliding scale insulin-DC Levemir he received on the night of admission as it resulted in hypoglycemia-he does not take it at home  - sugars rising into 300s- increase to moderate dose sliding scale   Coronary artery disease and  Peripheral vascular disease - On last admission he had elevated troponin with T-wave inversions on his EKG and chest pain -Myoview last week was negative -Imaging revealed coronary calcifications and calcifications in the blood vessels supplying lower extremities and therefore Dr. Oran Rein recommended the patient be placed on aspirin and Plavix  Severe COPD? -   Started on Prednisone 40 mg a little over a month ago by PCP and is on a very slow outpatient steroid taper by his PCP  - is down to 25 mg daily -On morphine sulfate at home as well for dyspnea -Continue duo neb treatments- no exacerbation    CKD (chronic kidney disease), stage II    Chronic diastolic CHF (congestive heart failure) -Has only grade 1 diastolic dysfunction    Pressure ulcer      Code Status: DO NOT RESUSCITATE Family Communication: with POA Jennifer daily Disposition Plan: treat pneumonia and follow DVT prophylaxis: Heparin Consultants: GI Procedures: Flexible sigmoidoscopy  Antibiotics: Anti-infectives    Start     Dose/Rate Route Frequency Ordered Stop   06/20/15 1600  cefTRIAXone (ROCEPHIN) 2 g in dextrose 5 % 50 mL IVPB  Status:  Discontinued     2 g 100 mL/hr over 30 Minutes Intravenous Every 24 hours 06/19/15 1656 06/20/15 0737   06/20/15 0800  ciprofloxacin (CIPRO) IVPB 400 mg  Status:  Discontinued     400 mg 200 mL/hr over 60 Minutes Intravenous Every 12 hours 06/20/15 0737 06/21/15 1206   06/20/15 0800  metroNIDAZOLE (FLAGYL) IVPB 500 mg  Status:  Discontinued     500 mg 100 mL/hr over 60 Minutes Intravenous Every 8 hours 06/20/15 0737 06/21/15 1206   06/19/15 1945  azithromycin (ZITHROMAX) 500 mg in dextrose 5 % 250 mL IVPB  Status:  Discontinued     500 mg 250 mL/hr over 60 Minutes Intravenous  Once 06/19/15 1932 06/19/15 1949   06/19/15 1630  cefTRIAXone (ROCEPHIN) 2 g in dextrose 5 % 50 mL IVPB     2 g 100 mL/hr over 30 Minutes Intravenous  Once 06/19/15 1628 06/19/15 2014      Objective: Filed Weights   06/19/15 2126 06/22/15 1406 06/23/15 0423  Weight: 57.5 kg (126 lb 12.2 oz) 60 kg (132 lb 4.4 oz) 61 kg (134 lb 7.7 oz)    Intake/Output Summary (Last 24 hours) at 06/23/15 1348 Last data filed at 06/23/15 0653  Gross per 24 hour  Intake    775 ml  Output    525 ml  Net    250 ml     Vitals Filed Vitals:   06/22/15 2045 06/22/15  2143 06/23/15 0423 06/23/15 0913  BP:  98/52 110/64   Pulse:  72 73   Temp:  98.1 F (36.7 C) 97.7 F (36.5 C)   TempSrc:  Oral Oral   Resp:  20 20   Height:      Weight:   61 kg (134 lb 7.7 oz)   SpO2: 96% 100% 98% 94%    Exam:  General:  Pt is alert, confused to time and place, not in acute distress- sitter at bedside  HEENT: No icterus, No thrush, oral mucosa moist  Cardiovascular: regular rate and rhythm, S1/S2 No murmur  Respiratory: clear to auscultation bilaterally   Abdomen: Soft, +Bowel sounds, tender in suprapubic area today prior to and and out cath, non distended, no guarding  MSK: No LE edema, cyanosis or clubbing  Data Reviewed: Basic Metabolic Panel:  Recent Labs Lab 06/19/15 1649  06/20/15 0436 06/22/15 0800 06/23/15 0419  NA 137 140 137 136  K 4.9 4.2 4.4 4.7  CL 103 107 105 105  CO2 24 25 24 27   GLUCOSE 321* 96 203* 182*  BUN 21* 18 25* 31*  CREATININE 0.96 0.83 1.01 1.16  CALCIUM 8.9 8.7* 8.4* 8.2*   Liver Function Tests:  Recent Labs Lab 06/19/15 1649  AST 24  ALT 17  ALKPHOS 57  BILITOT 0.7  PROT 6.4*  ALBUMIN 3.4*   No results for input(s): LIPASE, AMYLASE in the last 168 hours. No results for input(s): AMMONIA in the last 168 hours. CBC:  Recent Labs Lab 06/19/15 1649 06/20/15 0436 06/21/15 0500 06/22/15 0800 06/23/15 0419  WBC 5.5 5.8 5.0 9.4 7.0  NEUTROABS 5.1  --   --   --   --   HGB 11.7* 9.9* 9.7* 10.2* 9.3*  HCT 35.4* 31.0* 29.5* 31.8* 28.3*  MCV 93.7 92.8 93.4 92.7 94.0  PLT 194 160 156 159 149*   Cardiac Enzymes: No results for input(s): CKTOTAL, CKMB, CKMBINDEX, TROPONINI in the last 168 hours. BNP (last 3 results)  Recent Labs  04/17/15 2150 04/23/15 0022 04/28/15 1502  BNP 653.6* 464.5* 304.9*    ProBNP (last 3 results) No results for input(s): PROBNP in the last 8760 hours.  CBG:  Recent Labs Lab 06/22/15 1151 06/22/15 1716 06/22/15 2139 06/23/15 0759 06/23/15 1159  GLUCAP 339* 334*  245* 158* 319*    Recent Results (from the past 240 hour(s))  Blood Culture (routine x 2)     Status: None (Preliminary result)   Collection Time: 06/19/15  4:49 PM  Result Value Ref Range Status   Specimen Description BLOOD RIGHT FOREARM  Final   Special Requests BOTTLES DRAWN AEROBIC AND ANAEROBIC 4.5CC  Final   Culture   Final    NO GROWTH 4 DAYS Performed at [facility]    Report Status PENDING  Incomplete  Blood Culture (routine x 2)     Status: None (Preliminary result)   Collection Time: 06/19/15  5:07 PM  Result Value Ref Range Status   Specimen Description BLOOD RIGHT HAND  Final   Special Requests BOTTLES DRAWN AEROBIC ONLY 3CC  Final   Culture   Final    NO GROWTH 4 DAYS Performed at [facility]    Report Status PENDING  Incomplete  Urine culture     Status: None   Collection Time: 06/19/15  6:37 PM  Result Value Ref Range Status   Specimen Description URINE, CATHETERIZED  Final   Special Requests NONE  Final   Culture   Final    20,000 COLONIES/mL YEAST Performed at [facility]    Report Status 06/21/2015 FINAL  Final  C difficile quick scan w PCR reflex     Status: None   Collection Time: 06/20/15 11:00 AM  Result Value Ref Range Status   C Diff antigen NEGATIVE NEGATIVE Final   C Diff toxin NEGATIVE NEGATIVE Final   C Diff interpretation Negative for toxigenic C. difficile  Final     Studies: Dg Chest Port 1 View  06/23/2015   CLINICAL DATA:  Three-day history of cough  EXAM: PORTABLE CHEST - 1 VIEW  COMPARISON:  June 19, 2015  FINDINGS: Lungs remain hyperexpanded. There is new airspace opacity in the right mid lung consistent with pneumonia. There is also underlying scarring in this area. No other areas of airspace consolidation identified. The heart size is within normal limits.  Pulmonary vascularity reflects a degree of underlying emphysematous change. There is extensive lymph node calcification diffusely, stable. There is  degenerative change in the thoracic spine.  IMPRESSION: New right mid lung region pneumonia. There is a degree of underlying emphysematous change. Multiple calcified lymph nodes are noted. Patient has a history of histoplasmosis. Followup PA and lateral chest radiographs recommended in 3-4 weeks following trial of antibiotic therapy to ensure resolution and exclude underlying malignancy.   Electronically Signed   By: Lowella Grip III M.D.   On: 06/23/2015 10:58    Scheduled Meds:  Scheduled Meds: . aspirin  81 mg Oral q1800  . clopidogrel  75 mg Oral Daily  . feeding supplement (GLUCERNA SHAKE)  237 mL Oral TID BM  . heparin  5,000 Units Subcutaneous 3 times per day  . insulin aspart  0-9 Units Subcutaneous TID WC  . ipratropium-albuterol  3 mL Nebulization BID  . pantoprazole sodium  20 mg Oral Daily  . predniSONE  20 mg Oral Q breakfast  . predniSONE  5 mg Oral Q breakfast  . saccharomyces boulardii  250 mg Oral BID  . sertraline  50 mg Oral QHS  . tamsulosin  0.4 mg Oral Daily   Continuous Infusions: . sodium chloride 50 mL/hr at 06/23/15 7106    Time spent on care of this patient: 35 min   [place], MD 06/23/2015, 1:48 PM  LOS: 4 days   [facility] Office  [phone] Pager - Text Page per [email or web address] If 7PM-7AM, please contact night-coverage [email or web address]

## 2015-06-23 NOTE — Progress Notes (Signed)
Inpatient Diabetes Program Recommendations  AACE/ADA: New Consensus Statement on Inpatient Glycemic Control (2013)  Target Ranges:  Prepandial:   less than 140 mg/dL      Peak postprandial:   less than 180 mg/dL (1-2 hours)      Critically ill patients:  140 - 180 mg/dL    Results for [name], [name] (MRN [id number]) as of 06/23/2015 10:58  Ref. Range 06/22/2015 07:30 06/22/2015 11:51 06/22/2015 17:16 06/22/2015 21:39  Glucose-Capillary Latest Ref Range: 65-99 mg/dL 154 (H) 339 (H) 334 (H) 245 (H)     Admit with: AMS  History: DM, COPD, CHF, CKD  Home DM Meds: Metformin 250 mg bid  Current DM Orders: Novolog Sensitive SSI (0-9 units) TID AC     -Patient currently receiving Prednisone 25 mg daily.  -Having elevated postprandial glucose levels likely due to Prednisone.  -Patient required 16 units Novolog SSI yesterday (08/15).    MD- If patient not discharged home today, please consider adding low dose Novolog Meal Coverage while patient's home Metformin is on hold-  Novolog 4 units tidwc (hold if NPO, hold if pt eats <50% of meal)   Will follow Wyn Quaker RN, MSN, CDE Diabetes Coordinator Inpatient Glycemic Control Team Team Pager: [phone] (8a-5p)

## 2015-06-24 LAB — CBC WITH DIFFERENTIAL/PLATELET
BASOS ABS: 0 10*3/uL (ref 0.0–0.1)
BASOS PCT: 0 % (ref 0–1)
EOS ABS: 0 10*3/uL (ref 0.0–0.7)
EOS PCT: 1 % (ref 0–5)
HEMATOCRIT: 31.3 % — AB (ref 39.0–52.0)
Hemoglobin: 10.1 g/dL — ABNORMAL LOW (ref 13.0–17.0)
Lymphocytes Relative: 6 % — ABNORMAL LOW (ref 12–46)
Lymphs Abs: 0.4 10*3/uL — ABNORMAL LOW (ref 0.7–4.0)
MCH: 30.6 pg (ref 26.0–34.0)
MCHC: 32.3 g/dL (ref 30.0–36.0)
MCV: 94.8 fL (ref 78.0–100.0)
MONOS PCT: 2 % — AB (ref 3–12)
Monocytes Absolute: 0.1 10*3/uL (ref 0.1–1.0)
Neutro Abs: 6.6 10*3/uL (ref 1.7–7.7)
Neutrophils Relative %: 91 % — ABNORMAL HIGH (ref 43–77)
PLATELETS: 169 10*3/uL (ref 150–400)
RBC: 3.3 MIL/uL — ABNORMAL LOW (ref 4.22–5.81)
RDW: 16.7 % — AB (ref 11.5–15.5)
WBC: 7.2 10*3/uL (ref 4.0–10.5)

## 2015-06-24 LAB — BASIC METABOLIC PANEL
Anion gap: 9 (ref 5–15)
BUN: 28 mg/dL — AB (ref 6–20)
CALCIUM: 8.4 mg/dL — AB (ref 8.9–10.3)
CO2: 22 mmol/L (ref 22–32)
CREATININE: 0.98 mg/dL (ref 0.61–1.24)
Chloride: 108 mmol/L (ref 101–111)
GFR calc Af Amer: >60 mL/min (ref >60)
GLUCOSE: 187 mg/dL — AB (ref 65–99)
Potassium: 4.8 mmol/L (ref 3.5–5.1)
Sodium: 139 mmol/L (ref 135–145)

## 2015-06-24 LAB — CULTURE, BLOOD (ROUTINE X 2)
CULTURE: NO GROWTH
CULTURE: NO GROWTH

## 2015-06-24 LAB — GLUCOSE, CAPILLARY
GLUCOSE-CAPILLARY: 126 mg/dL — AB (ref 65–99)
Glucose-Capillary: 251 mg/dL — ABNORMAL HIGH (ref 65–99)
Glucose-Capillary: 170 mg/dL — ABNORMAL HIGH (ref 65–99)
Glucose-Capillary: 279 mg/dL — ABNORMAL HIGH (ref 65–99)

## 2015-06-24 MED ORDER — LEVOFLOXACIN 500 MG PO TABS
500.0000 mg | ORAL_TABLET | Freq: Every day | ORAL | Status: DC
  Administered 2015-06-25: 500 mg via ORAL
  Filled 2015-06-24: qty 1

## 2015-06-24 MED ORDER — CEFTAZIDIME 1 G IJ SOLR
1.0000 g | Freq: Two times a day (BID) | INTRAMUSCULAR | Status: AC
  Administered 2015-06-24: 1 g via INTRAVENOUS
  Filled 2015-06-24: qty 1

## 2015-06-24 MED ORDER — RESOURCE THICKENUP CLEAR PO POWD
ORAL | Status: DC | PRN
  Filled 2015-06-24: qty 125

## 2015-06-24 NOTE — Consult Note (Signed)
   [facility] CM Inpatient Consult   06/24/2015  [name] [birth year] [id number]   Patient evaluated again for [facility] services due to multiple admissions. [name], HCPOA declined [facility] services during July hospitalization. Spoke with inpatient RNCM prior to contacting HCPOA again for [employer] services. Call made to [facility] at [phone] and she placed writer on hold and never returned. Called again and spoke with Anderson Malta who again states they have all they need in terms of support with aide services and home health services provided by [facility]. She states " I appreciate you calling and offering services again but we do not need your services". Reports they are doing everything they can to manage well at home and she has been told during a home assessment that there is nothing else she can do as she has everything she needs to manage patient at home. Will update inpatient RNCM about conversation with [place].   Marthenia Rolling, MSN-Ed, RN,BSN [facility] Liaison [phone]

## 2015-06-24 NOTE — Progress Notes (Signed)
Speech Language Pathology Treatment: Dysphagia  Patient Details Name: [name] MRN: [id number] DOB: 18-May-1929 Today's Date: 06/24/2015 Time: 8811-0315 SLP Time Calculation (min) (ACUTE ONLY): 24 min  Assessment / Plan / Recommendation Clinical Impression  Per RN, pt doing better today with his swallowing.  Elmyra Ricks, private caregiver, reports pt with better tolerance *less coughing, with thicker drinks such as Ensure than thin.  SLP reviewed precautions, compensation strategies with pt to mitigate dysphagia/aspiration precautions.    Pt observed with thin tea requiring mod cues to tuck chin.  Wet vocal quality noted after swallow x2 - pt benefited from verbal cues to clear throat.  Agree, pt tolerating nectar thickened liquids better than thin, therefore will have have can of thickener sent for use during meals.  Pt reports more problems swallowing foods on occasion- denies difficulties with drinks. Elmyra Ricks, Charity fundraiser, reports ordering soft foods for pt. Suspect decreased awareness due to chronicity of dysphagia.    Recommend allow pt to have thin drinks between meals to maximize hydration- preferring thin water.  Pt, RN, sitter aware.    Suspect component of chronic dysphagia/aspiration and SLP role is to mitigate it.  Pt agreeable to plan.    Will follow up x1 more for education.  Recommend trial follow up SLP for dysphagia management/rehabiltation.    HPI Other Pertinent Information: 81 yo male adm to [facility] with acute encephalopathy.  Pt PMH + for COPD, CHF, CKD.  Pt found to have a right middle lobe pna per CXR 06/22/2014.  Has h/o dysphagia diagnosed via MBS 04/2015 with AUDIBLE aspiration of thin liquids and pharyngeal residuals  - chin tuck helpful to prevent aspiration and decrease resdiuals.  Swallow evaluation ordered during this admit. RN reports pt coughed all through breakfast.  Private caregiver Elmyra Ricks arrived during session and reports she ordered soft foods for lunch due to  pt's dysphagia.     Pertinent Vitals Pain Assessment: No/denies pain  SLP Plan  Continue with current plan of care    Recommendations Diet recommendations: Thin liquid;Nectar-thick liquid (nectar liquids with meals when more likely to aspirate, thin between meals to maximize hydration - preferring thin water ) Liquids provided via: Straw Medication Administration: Whole meds with puree Supervision: Full supervision/cueing for compensatory strategies Compensations: Slow rate;Clear throat intermittently;Use straw to facilitate chin tuck;Chin tuck Postural Changes and/or Swallow Maneuvers: Seated upright 90 degrees;Upright 30-60 min after meal              Oral Care Recommendations: Oral care BID Follow up Recommendations: Skilled Nursing facility Plan: Continue with current plan of care    [place], [facility] SLP [phone]

## 2015-06-24 NOTE — Progress Notes (Signed)
[facility] Progress Note   [name] JME:[id number] DOB: 09/24/1929 DOA: 06/19/2015 PCP: Marylynn Pearson, MD  Brief narrative:  81 y/o ?-severe COPD,  Hypertension, diabetes mellitus,  recently admitted and treated for an Escherichia coli UTI and bacteremia and completed treatment as outpatient with Keflex on 8/2.   Admit  06/19/15 for 2 days of confusion and decreased mobility.  ?right flank pain on admission.  There is suprapubic tenderness noted in the ER.   UA was not consistent with a UTI.  Temperature was 99 in the ER.  Further workup included a CT scan which revealed findings consistent with acute proctitis.   Further history obtained by the GI doctor after speaking with his daughter. He was having issues with fecal impaction and was in the ER last week and disimpacted.  Since then he's had diarrhea with 2-3 loose bowel movements per day. The patient lives at home with his daughter and also has private sitters.  He has had multiple admissions this year, often for COPD.   Subjective:  Not sure where he is and cannot orient completelty Ate some today  RN reports 2 loose but formed stool No cp No sob No fever  No chills No blurred vision   Assessment/Plan:    Acute encephalopathy with history of dementia - Due to history obtained from POA which mentions that confusion improved after bladder catheterization, confusion was likely secondary to discomfort from bladder distention -has a Charity fundraiser at bedside  -Diarrhea after being disimpacted in the ER - C. difficile PCR checked due to recent use of antibiotics-this is negative - initially Rx ciprofloxacin and Flagyl IV-stopped  on 8/14 - a flex sig performed  and no signs of proctitis noted-small healing erosion in the rectum found may be related to recent fecal impaction  Urinary retention - History of prostate cancer in the past -700 mL obtained in ER -Although he has a condom cath and still  has urine output-noted to be tender in the suprapubic area on my exam-I asked for a bladder scan to be repeated-showed greater than 500 mL in the bladder-I and O cath resulted in about 600 mL of urine -  placed Foley catheter on 8/14 and made an appointment to follow-up with urology later this week  -Already on Flomax -This is likely the cause of his frequent urinary tract infections-note UA was free of infection on this admission -Urine culture is neg  New RLL infiltrate / cough with eating -  May have had a pneumonia on admission ( B/l infiltrates on admission CXR)  - POA says he was lethargic similar to how he was yesterday - Cipro and Flagyl was given on admision for possible proctitis and he improved considerably-  - Ceftaz 8/16 for HCAP (cefepime not available)- doubt MRSA therefore will not start Vanc - SLP eval today- per POA, he has had an SLP eval on his recent admissions and was told he was intermittently aspirating - was treated for PNA on early June admission - Narrow to PO levaquin 8/17    Diabetes mellitus type 2, uncontrolled - uses metformin at home -Continue sliding scale insulin -DC Levemir he received on the night of admission as it resulted in hypoglycemia [he does not take it at home]  - sugars 126-251, continue mod resistant SSI  Coronary artery disease and Peripheral vascular disease - On last admission he had elevated troponin with T-wave inversions on his EKG and chest pain -Myoview last week was negative -Imaging  revealed coronary calcifications and calcifications in the blood vessels supplying lower extremities and therefore Dr. Oran Rein recommended the patient be placed on aspirin and Plavix  Severe COPD? -  Started on Prednisone 40 mg a little over a month ago by PCP and is on a very slow outpatient steroid taper by his PCP  - is down to 25 mg daily -On morphine sulfate at home as well for dyspnea -Continue duo neb treatments- no exacerbation    CKD (chronic  kidney disease), stage II    Chronic diastolic CHF (congestive heart failure) -Has only grade 1 diastolic dysfunction    Pressure ulcer    Speech therapy input noted Continue diet with precautions maximizing compliance for airway protection. Georges Lynch to continue to order softer foods for pt. Recommend strict aspiration and reflux precautions given pt h/o dysphagia, COPD, ? Current pneumonia, and esophageal reflux/? Dysmotility. Educated pt, private caregiver Elmyra Ricks and Therapist, sports to findings/recommendations. Pt will need full supervision to generalize dysphagia compensation strategies.   SLP to follow up x1 given findings on CXR and RN report of pt coughing throughout entire breakfast. With pt's COPD, difficult to assure cough is aspiration related.   Code Status: DO NOT RESUSCITATE Family Communication: with POA Jennifer daily Disposition Plan: treat pneumonia and follow DVT prophylaxis: Heparin Consultants: GI Procedures: Flexible sigmoidoscopy  Antibiotics: Anti-infectives    Start     Dose/Rate Route Frequency Ordered Stop   06/23/15 1600  cefTAZidime (FORTAZ) 1 g in dextrose 5 % 50 mL IVPB     1 g 100 mL/hr over 30 Minutes Intravenous Every 12 hours 06/23/15 1437     06/20/15 1600  cefTRIAXone (ROCEPHIN) 2 g in dextrose 5 % 50 mL IVPB  Status:  Discontinued     2 g 100 mL/hr over 30 Minutes Intravenous Every 24 hours 06/19/15 1656 06/20/15 0737   06/20/15 0800  ciprofloxacin (CIPRO) IVPB 400 mg  Status:  Discontinued     400 mg 200 mL/hr over 60 Minutes Intravenous Every 12 hours 06/20/15 0737 06/21/15 1206   06/20/15 0800  metroNIDAZOLE (FLAGYL) IVPB 500 mg  Status:  Discontinued     500 mg 100 mL/hr over 60 Minutes Intravenous Every 8 hours 06/20/15 0737 06/21/15 1206   06/19/15 1945  azithromycin (ZITHROMAX) 500 mg in dextrose 5 % 250 mL IVPB  Status:  Discontinued     500 mg 250 mL/hr over 60 Minutes Intravenous  Once 06/19/15 1932 06/19/15 1949   06/19/15 1630   cefTRIAXone (ROCEPHIN) 2 g in dextrose 5 % 50 mL IVPB     2 g 100 mL/hr over 30 Minutes Intravenous  Once 06/19/15 1628 06/19/15 2014      Objective: Filed Weights   06/22/15 1406 06/23/15 0423 06/24/15 0512  Weight: 60 kg (132 lb 4.4 oz) 61 kg (134 lb 7.7 oz) 58.7 kg (129 lb 6.6 oz)    Intake/Output Summary (Last 24 hours) at 06/24/15 0752 Last data filed at 06/24/15 0515  Gross per 24 hour  Intake 405.83 ml  Output   1400 ml  Net -994.17 ml     Vitals Filed Vitals:   06/23/15 0913 06/23/15 1420 06/23/15 2042 06/24/15 0512  BP:  93/52 122/77 137/57  Pulse:  87 75 70  Temp:  98 F (36.7 C) 98.1 F (36.7 C) 97.8 F (36.6 C)  TempSrc:  Oral Oral Oral  Resp:  18 20 20   Height:      Weight:    58.7 kg (129 lb  6.6 oz)  SpO2: 94% 98% 98% 97%    Exam:  General:  Pt is alert, confused to time and place, not in acute distress- sitter at bedside  HEENT: No icterus, No thrush, oral mucosa moist  Cardiovascular: regular rate and rhythm, S1/S2 No murmur  Respiratory: clear to auscultation bilaterally   Abdomen: Soft, +Bowel sounds, tender in suprapubic area today prior to and and out cath, non distended, no guarding  MSK: No LE edema, cyanosis or clubbing  Data Reviewed: Basic Metabolic Panel:  Recent Labs Lab 06/19/15 1649 06/20/15 0436 06/22/15 0800 06/23/15 0419  NA 137 140 137 136  K 4.9 4.2 4.4 4.7  CL 103 107 105 105  CO2 24 25 24 27   GLUCOSE 321* 96 203* 182*  BUN 21* 18 25* 31*  CREATININE 0.96 0.83 1.01 1.16  CALCIUM 8.9 8.7* 8.4* 8.2*   Liver Function Tests:  Recent Labs Lab 06/19/15 1649  AST 24  ALT 17  ALKPHOS 57  BILITOT 0.7  PROT 6.4*  ALBUMIN 3.4*   No results for input(s): LIPASE, AMYLASE in the last 168 hours. No results for input(s): AMMONIA in the last 168 hours. CBC:  Recent Labs Lab 06/19/15 1649 06/20/15 0436 06/21/15 0500 06/22/15 0800 06/23/15 0419  WBC 5.5 5.8 5.0 9.4 7.0  NEUTROABS 5.1  --   --   --   --   HGB  11.7* 9.9* 9.7* 10.2* 9.3*  HCT 35.4* 31.0* 29.5* 31.8* 28.3*  MCV 93.7 92.8 93.4 92.7 94.0  PLT 194 160 156 159 149*   Cardiac Enzymes: No results for input(s): CKTOTAL, CKMB, CKMBINDEX, TROPONINI in the last 168 hours. BNP (last 3 results)  Recent Labs  04/17/15 2150 04/23/15 0022 04/28/15 1502  BNP 653.6* 464.5* 304.9*    ProBNP (last 3 results) No results for input(s): PROBNP in the last 8760 hours.  CBG:  Recent Labs Lab 06/22/15 2139 06/23/15 0759 06/23/15 1159 06/23/15 1716 06/23/15 2039  GLUCAP 245* 158* 319* 333* 157*    Recent Results (from the past 240 hour(s))  Blood Culture (routine x 2)     Status: None (Preliminary result)   Collection Time: 06/19/15  4:49 PM  Result Value Ref Range Status   Specimen Description BLOOD RIGHT FOREARM  Final   Special Requests BOTTLES DRAWN AEROBIC AND ANAEROBIC 4.5CC  Final   Culture   Final    NO GROWTH 4 DAYS Performed at [facility]    Report Status PENDING  Incomplete  Blood Culture (routine x 2)     Status: None (Preliminary result)   Collection Time: 06/19/15  5:07 PM  Result Value Ref Range Status   Specimen Description BLOOD RIGHT HAND  Final   Special Requests BOTTLES DRAWN AEROBIC ONLY 3CC  Final   Culture   Final    NO GROWTH 4 DAYS Performed at [facility]    Report Status PENDING  Incomplete  Urine culture     Status: None   Collection Time: 06/19/15  6:37 PM  Result Value Ref Range Status   Specimen Description URINE, CATHETERIZED  Final   Special Requests NONE  Final   Culture   Final    20,000 COLONIES/mL YEAST Performed at [facility]    Report Status 06/21/2015 FINAL  Final  C difficile quick scan w PCR reflex     Status: None   Collection Time: 06/20/15 11:00 AM  Result Value Ref Range Status   C Diff antigen NEGATIVE NEGATIVE  Final   C Diff toxin NEGATIVE NEGATIVE Final   C Diff interpretation Negative for toxigenic C. difficile  Final     Studies: Dg  Chest Port 1 View  06/23/2015   CLINICAL DATA:  Three-day history of cough  EXAM: PORTABLE CHEST - 1 VIEW  COMPARISON:  June 19, 2015  FINDINGS: Lungs remain hyperexpanded. There is new airspace opacity in the right mid lung consistent with pneumonia. There is also underlying scarring in this area. No other areas of airspace consolidation identified. The heart size is within normal limits. Pulmonary vascularity reflects a degree of underlying emphysematous change. There is extensive lymph node calcification diffusely, stable. There is degenerative change in the thoracic spine.  IMPRESSION: New right mid lung region pneumonia. There is a degree of underlying emphysematous change. Multiple calcified lymph nodes are noted. Patient has a history of histoplasmosis. Followup PA and lateral chest radiographs recommended in 3-4 weeks following trial of antibiotic therapy to ensure resolution and exclude underlying malignancy.   Electronically Signed   By: Lowella Grip III M.D.   On: 06/23/2015 10:58    Scheduled Meds:  Scheduled Meds: . aspirin  81 mg Oral q1800  . cefTAZidime (FORTAZ)  IV  1 g Intravenous Q12H  . clopidogrel  75 mg Oral Daily  . feeding supplement (GLUCERNA SHAKE)  237 mL Oral TID BM  . heparin  5,000 Units Subcutaneous 3 times per day  . insulin aspart  0-15 Units Subcutaneous TID WC  . insulin aspart  0-5 Units Subcutaneous QHS  . ipratropium-albuterol  3 mL Nebulization BID  . pantoprazole sodium  20 mg Oral Daily  . predniSONE  20 mg Oral Q breakfast  . predniSONE  5 mg Oral Q breakfast  . saccharomyces boulardii  250 mg Oral BID  . sertraline  50 mg Oral QHS  . tamsulosin  0.4 mg Oral Daily   Continuous Infusions: . sodium chloride 50 mL/hr at 06/24/15 0146    Time spent on care of this patient: 25 min  Verneita Griffes, MD [facility] [phone]

## 2015-06-24 NOTE — Care Management Note (Signed)
Case Management Note  Patient Details  Name: [name] MRN: [id number] Date of Birth: 07/03/1929  Subjective/Objective: Spoke to Jennifer(HCPOA) on phone about d/c plan.  Informed her of patient having several admissions in past 6 months,this becomes a red flag to [place] since our goal is to make sure we are doing our best to keep you out of the hospital safely, informed her of  HRI(High Risk Initiative program) She would like a brochure on this program for the future.She is not willing to give up her current home care agency. She has 24hr caregiver service,she has logs for meds, & vital signs,pill box.[facility] is also following.She said we are doing great a job, & appreciates our offering of additional services but declines [place].                Action/Plan:d/c plan home w/HHC.   Expected Discharge Date:   (unknown)               Expected Discharge Plan:  National  In-House Referral:     Discharge planning Services  CM Consult  Post Acute Care Choice:   [facility]-HHRN/HHPT) Choice offered to:  Adult Children  DME Arranged:    DME Agency:     HH Arranged:  RN, PT [place] Agency:  [place]  Status of Service:  Completed, signed off  Medicare Important Message Given:  Yes-second notification given Date Medicare IM Given:    Medicare IM give by:    Date Additional Medicare IM Given:    Additional Medicare Important Message give by:     If discussed at [place] of Stay Meetings, dates discussed:    Additional Comments:  Dessa Phi, RN 06/24/2015, 3:26 PM

## 2015-06-25 LAB — CBC WITH DIFFERENTIAL/PLATELET
BASOS PCT: 0 % (ref 0–1)
Basophils Absolute: 0 10*3/uL (ref 0.0–0.1)
EOS PCT: 0 % (ref 0–5)
Eosinophils Absolute: 0 10*3/uL (ref 0.0–0.7)
HEMATOCRIT: 29.3 % — AB (ref 39.0–52.0)
Hemoglobin: 9.4 g/dL — ABNORMAL LOW (ref 13.0–17.0)
Lymphocytes Relative: 4 % — ABNORMAL LOW (ref 12–46)
Lymphs Abs: 0.2 10*3/uL — ABNORMAL LOW (ref 0.7–4.0)
MCH: 29.6 pg (ref 26.0–34.0)
MCHC: 32.1 g/dL (ref 30.0–36.0)
MCV: 92.1 fL (ref 78.0–100.0)
MONO ABS: 0.4 10*3/uL (ref 0.1–1.0)
MONOS PCT: 8 % (ref 3–12)
NEUTROS ABS: 4.6 10*3/uL (ref 1.7–7.7)
Neutrophils Relative %: 88 % — ABNORMAL HIGH (ref 43–77)
PLATELETS: 159 10*3/uL (ref 150–400)
RBC: 3.18 MIL/uL — ABNORMAL LOW (ref 4.22–5.81)
RDW: 16.4 % — AB (ref 11.5–15.5)
WBC: 5.2 10*3/uL (ref 4.0–10.5)

## 2015-06-25 LAB — BASIC METABOLIC PANEL
ANION GAP: 8 (ref 5–15)
BUN: 24 mg/dL — AB (ref 6–20)
CALCIUM: 8.3 mg/dL — AB (ref 8.9–10.3)
CO2: 23 mmol/L (ref 22–32)
Chloride: 108 mmol/L (ref 101–111)
Creatinine, Ser: 0.88 mg/dL (ref 0.61–1.24)
GFR calc Af Amer: >60 mL/min (ref >60)
GFR calc non Af Amer: >60 mL/min (ref >60)
GLUCOSE: 175 mg/dL — AB (ref 65–99)
Potassium: 4.4 mmol/L (ref 3.5–5.1)
Sodium: 139 mmol/L (ref 135–145)

## 2015-06-25 LAB — GLUCOSE, CAPILLARY
GLUCOSE-CAPILLARY: 150 mg/dL — AB (ref 65–99)
Glucose-Capillary: 231 mg/dL — ABNORMAL HIGH (ref 65–99)
Glucose-Capillary: 278 mg/dL — ABNORMAL HIGH (ref 65–99)

## 2015-06-25 MED ORDER — RESOURCE THICKENUP CLEAR PO POWD: 1.0000 | ORAL | Status: DC | PRN

## 2015-06-25 MED ORDER — LEVOFLOXACIN 500 MG PO TABS: 500.0000 mg | ORAL_TABLET | Freq: Every day | ORAL | Status: DC

## 2015-06-25 NOTE — Progress Notes (Signed)
PHYSICAL THERAPY  EVAL PERFORMED, FULL NOTE TO FOLLOW. PATIENT IS UNABLE TO SAFELY TRANSFER TODAY. RECOMMEND NONEMERGENT TRANSPORT TO HOME TODAY.Tresa Endo PT [phone]

## 2015-06-25 NOTE — Care Management Important Message (Signed)
Important Message  Patient Details  Name: QUALYN OYERVIDES MRN: [id number] Date of Birth: 12-12-1928   Medicare Important Message Given:  Yes-third notification given    Camillo Flaming 06/25/2015, 1:04 [place] Message  Patient Details  Name: VIVAN AGOSTINO MRN: [id number] Date of Birth: 29-Jan-1929   Medicare Important Message Given:  Yes-third notification given    Camillo Flaming 06/25/2015, 1:04 PM

## 2015-06-25 NOTE — Discharge Summary (Signed)
Physician Discharge Summary  [name] WIO:[id number] DOB: 06/09/1929 DOA: 06/19/2015  PCP: Marylynn Pearson, MD  Admit date: 06/19/2015 Discharge date: 06/25/2015  Time spent: 45 minutes  Recommendations for Outpatient Follow-up:  1. Needs to complete Levaquin 500 oin 06/30/15 2. Needs cbc 1 week 3. Consider CXR 1 month 4. Consider goals of care and discussion about chronic aspiration as OP-I broached this topic briefly in the hospital setting but this will need to reinforced 5. Patient scheduled to see urologist as an outpatient for chronic urinary incontinence\ 6. May consider Imodium if to have diarrhea 7. Urology appointment will be set up -details forthcoming 8. Patient to resume home health with current company and get speech therapy as an outpatient  9. Consider lowering dose of oral morphine for dyspnea as this can also cause confusion  Discharge Diagnoses:  Principal Problem:   Acute encephalopathy Active Problems:   Diabetes mellitus type 2, uncontrolled   Essential hypertension   CKD (chronic kidney disease), stage II   COPD (chronic obstructive pulmonary disease)   Chronic diastolic CHF (congestive heart failure)   Pressure ulcer   Urinary retention   Malnutrition of moderate degree   Discharge Condition: Fair to guarded  Diet recommendation: Dysphagia diet as below SLP reviewed precautions, compensation strategies with pt to mitigate dysphagia/aspiration precautions.   Pt observed with thin tea requiring mod cues to tuck chin. Wet vocal quality noted after swallow x2 - pt benefited from verbal cues to clear throat. Agree, pt tolerating nectar thickened liquids better than thin, therefore will have have can of thickener sent for use during meals. Pt reports more problems swallowing foods on occasion- denies difficulties with drinks. Elmyra Ricks, Charity fundraiser, reports ordering soft foods for pt. Suspect decreased awareness due to chronicity of dysphagia.    Filed Weights   06/23/15 0423 06/24/15 0512 06/25/15 0511  Weight: 61 kg (134 lb 7.7 oz) 58.7 kg (129 lb 6.6 oz) 60.2 kg (132 lb 11.5 oz)    History of present illness:  81 y/o ?-severe COPD,  Hypertension, diabetes mellitus,  recently admitted and treated for an Escherichia coli UTI and bacteremia and completed treatment as outpatient with Keflex on 8/2.   Admit 06/19/15 for 2 days of confusion and decreased mobility.  ?right flank pain on admission. There is suprapubic tenderness noted in the ER.   UA was not consistent with a UTI.  Temperature was 99 in the ER.  Further workup included a CT scan which revealed findings consistent with acute proctitis.   Further history obtained by the GI doctor after speaking with his daughter. He was having issues with fecal impaction and was in the ER last week and disimpacted.  Since then he's had diarrhea with 2-3 loose bowel movements per day. The patient lives at home with his daughter and also has private sitters.   Hospital Course:  Acute encephalopathy with history of dementia - Due to history obtained from POA which mentions that confusion improved after bladder catheterization, confusion was likely secondary to discomfort from bladder distention -has a Charity fundraiser at bedside  -Diarrhea after being disimpacted in the ER - C. difficile PCR checked due to recent use of antibiotics-this is negative - initially Rx ciprofloxacin and Flagyl IV-stopped on 8/14 - a flex sig performed and no signs of proctitis noted-small healing erosion in the rectum found may be related to recent fecal impaction  Urinary retention - History of prostate cancer in the past -700 mL obtained in ER -Although he  has a condom cath and still has urine output-noted to be tender in the suprapubic area on my exam- bladder scan to be repeated-showed greater than 500 mL in the bladder-I and O cath resulted in about 600 mL of urine - placed Foley  catheter on 8/14 and made an appointment to follow-up with urology later this week  -Already on Flomax -This is likely the cause of his frequent urinary tract infections-note UA was free of infection on this admission -Urine culture is neg  New RLL infiltrate / cough with eating - May have had a pneumonia on admission ( B/l infiltrates on admission CXR) - POA says he was lethargic similar to how he was yesterday - Cipro and Flagyl was given on admision for possible proctitis and he improved considerably  - Ceftaz 8/16 for HCAP (cefepime not available)- doubt MRSA therefore will not start Vanc - SLP evaluated patient in the hospital - intermittently aspirating - was treated for PNA on early June admission - Narrow to PO levaquin 8/17 my need goals of care as well as palliative care discussion as an outpatient if this persists -   Diabetes mellitus type 2, uncontrolled - uses metformin at home -Continue sliding scale insulin -DC Levemir he received on the night of admission as it resulted in hypoglycemia [he does not take it at home] - sugars 126-251, continue mod resistant SSI  Coronary artery disease and Peripheral vascular disease - On last admission he had elevated troponin with T-wave inversions on his EKG and chest pain -Myoview this admission was neg  -Imaging revealed coronary calcifications and calcifications in the blood vessels supplying lower extremities and therefore Dr. Oran Rein recommended the patient be placed on aspirin and Plavix  Severe COPD? - Started on Prednisone 40 mg a little over a month ago by PCP and is on a very slow outpatient steroid taper by his PCP  - is down to 25 mg daily -On morphine sulfate at home as well for dyspnea--consider de-escalation of this as this can cause encephalopathy -Continue duo neb treatments- no exacerbation   CKD (chronic kidney disease), stage II   Chronic diastolic CHF (congestive heart failure) -Has only grade 1  diastolic dysfunction   Pressure ulcer -Stage I in appearance, frequent turning needed at home    Discharge Exam: Filed Vitals:   06/25/15 0511  BP: 145/52  Pulse: 65  Temp: 97.7 F (36.5 C)  Resp: 20    General: eomi confused but pleasant and appears to be at her usual baseline  Had some coughing this morning without significant effect, and is afebrile  Cardiovascular: S1-S2 no murmur rub or gallop Respiratory as clinically clear   Discharge Instructions   Discharge Instructions    Discharge instructions    Complete by:  As directed   Low sodium heart healthy diabetic diet  Will need close follow up with speech therapy Will also need Home health to come out and check on him Would encourage close follow up with Urology-appointment has been made to be seen soon for foley catheter Complete levaquin 500 mg on 06/30/15 consider discussion about goals of care should he continue to aspirate     Increase activity slowly    Complete by:  As directed           Current Discharge Medication List    START taking these medications   Details  levofloxacin (LEVAQUIN) 500 MG tablet Take 1 tablet (500 mg total) by mouth daily. Qty: 5 tablet, Refills: 0  Maltodextrin-Xanthan Gum (RESOURCE THICKENUP CLEAR) POWD Take 120 g by mouth as needed. Qty: 1 Can, Refills: 0      CONTINUE these medications which have CHANGED   Details  lactulose (CHRONULAC) 10 GM/15ML solution Take 15 mLs (10 g total) by mouth 2 (two) times daily as needed for moderate constipation. Qty: 240 mL, Refills: 0    polyethylene glycol (MIRALAX / GLYCOLAX) packet Take 17 g by mouth daily as needed for mild constipation. Qty: 14 each, Refills: 0      CONTINUE these medications which have NOT CHANGED   Details  acetaminophen (TYLENOL) 325 MG tablet Take 325 mg by mouth every 6 (six) hours as needed for mild pain.    albuterol (PROVENTIL HFA;VENTOLIN HFA) 108 (90 BASE) MCG/ACT inhaler Inhale 1-2 puffs into  the lungs every 6 (six) hours as needed for wheezing or shortness of breath.    aspirin 81 MG chewable tablet Chew 81 mg by mouth daily at 6 PM.    bisacodyl (DULCOLAX) 10 MG suppository Place 1 suppository (10 mg total) rectally as needed for moderate constipation. Qty: 12 suppository, Refills: 0    clopidogrel (PLAVIX) 75 MG tablet TAKE 1 TABLET DAILY Qty: 90 tablet, Refills: 3    docusate sodium (COLACE) 100 MG capsule Take 1 capsule (100 mg total) by mouth 2 (two) times daily. Qty: 10 capsule, Refills: 0    feeding supplement, GLUCERNA SHAKE, (GLUCERNA SHAKE) LIQD Take 237 mLs by mouth 3 (three) times daily between meals. Qty: 90 Can, Refills: 0    ipratropium-albuterol (DUONEB) 0.5-2.5 (3) MG/3ML SOLN Take 3 mLs by nebulization 2 (two) times daily.    metFORMIN (GLUCOPHAGE) 500 MG tablet Take 0.5 tablets (250 mg total) by mouth 2 (two) times daily with a meal. Qty: 30 tablet, Refills: 0    Morphine Sulfate (MORPHINE CONCENTRATE) 10 MG/0.5ML SOLN concentrated solution Take 0.25 mLs (5 mg total) by mouth every hour as needed for moderate pain, severe pain or shortness of breath. Qty: 30 mL, Refills: 0    pantoprazole sodium (PROTONIX) 40 mg/20 mL PACK Take 10 mLs (20 mg total) by mouth daily. Qty: 30 each, Refills: 1    !! predniSONE (DELTASONE) 10 MG tablet Take 5 mg by mouth daily with breakfast. Takes with 20 mg tablet to equal 25 mg daily.    !! predniSONE (DELTASONE) 20 MG tablet Take 1.5 tablets (30 mg total) by mouth daily with breakfast. Until you follow up with your primary care doctor. Qty: 45 tablet, Refills: 0    saccharomyces boulardii (FLORASTOR) 250 MG capsule Take 1 capsule (250 mg total) by mouth 2 (two) times daily. Qty: 60 capsule, Refills: 2    sertraline (ZOLOFT) 50 MG tablet Take 1 tablet (50 mg total) by mouth at bedtime. Qty: 30 tablet, Refills: 0    tamsulosin (FLOMAX) 0.4 MG CAPS capsule Take 1 capsule (0.4 mg total) by mouth daily. Qty: 30 capsule,  Refills: 0    tiotropium (SPIRIVA HANDIHALER) 18 MCG inhalation capsule Place 1 capsule (18 mcg total) into inhaler and inhale daily. Qty: 30 capsule, Refills: 0     !! - Potential duplicate medications found. Please discuss with provider.     Allergies  Allergen Reactions  . Dulera [Mometasone Furo-Formoterol Fum] Other (See Comments)    Causes facial/oral/nasal burning progressing to urinary tract blockage  . Pioglitazone Cough  . Ramipril Cough   Follow-up Information    Follow up with [facility].   Specialty:  [place]  Why:  HHRN/HHPT   Contact information:   [place] [phone]       Follow up with [facility]   Why:  Appointment time:  Thursday, June 25, 2015 at 9:00 a.m. with Jiles Crocker, NP   Contact information:   [place] [address] [phone]        The results of significant diagnostics from this hospitalization (including imaging, microbiology, ancillary and laboratory) are listed below for reference.    Significant Diagnostic Studies: Ct Abdomen Pelvis W Contrast  06/19/2015   CLINICAL DATA:  Recurring urinary tract infection. Suprapubic tenderness. Right flank pain and tenderness. Confusion over the last 2 days.  EXAM: CT ABDOMEN AND PELVIS WITH CONTRAST  TECHNIQUE: Multidetector CT imaging of the abdomen and pelvis was performed using the standard protocol following bolus administration of intravenous contrast.  CONTRAST:  173mL OMNIPAQUE IOHEXOL 300 MG/ML  SOLN  COMPARISON:  CT pelvis 05/24/2015. Abdominal radiographs 06/12/2015.  FINDINGS: Bilateral pleural effusions are present. There is associated atelectasis. A granuloma is present at the right lung base. Right lower lobe and right middle lobe bronchiectasis is evident.  The heart size is normal. Coronary artery calcifications are present. A small pericardial effusion is noted. Calcified paraesophageal lymph  nodes are present.  The liver and spleen are within normal limits. Dense calcifications are present throughout the splenic artery. The stomach, duodenum, and pancreas are within normal limits. The common bile duct is within normal limits. A 13 mm gallstone is present. There is no focal inflammation to suggest cholecystitis.  The adrenal glands are normal bilaterally. A calcified mass at the upper pole of the right kidney measures 2.0 x 1.1 cm without significant change. Multiple bilateral cysts are present in both kidneys. There is no hydronephrosis. The prostate gland is irregular and extends into the base of the urinary bladder. Cephalo caudad dimension is 5.4 cm.  Edematous changes surround the rectum. The rectum is less distended than on the prior exam. The more proximal sigmoid colon is within normal limits. The remainder the colon is unremarkable. The appendix is surgically absent. Small bowel is within normal limits. No significant adenopathy or free fluid is present.  Dense atherosclerotic calcifications are present in the abdominal aorta and branch vessels without aneurysm.  The bone windows demonstrate fusion of the SI joints bilaterally. No focal lytic or blastic lesions are present.  IMPRESSION: 1. Inflammatory changes about the rectum compatible with acute proctitis. 2. Enlarged and irregular prostate gland.  This is stable. 3. Extensive atherosclerotic changes. 4. Stable calcification in tip pole of the right kidney. 5. Progressive cystic changes in both kidneys. 6. Fusion of the SI joints. 7. Cholelithiasis without cholecystitis.   Electronically Signed   By: San Morelle M.D.   On: 06/19/2015 23:52   Dg Chest Port 1 View  06/23/2015   CLINICAL DATA:  Three-day history of cough  EXAM: PORTABLE CHEST - 1 VIEW  COMPARISON:  June 19, 2015  FINDINGS: Lungs remain hyperexpanded. There is new airspace opacity in the right mid lung consistent with pneumonia. There is also underlying scarring in  this area. No other areas of airspace consolidation identified. The heart size is within normal limits. Pulmonary vascularity reflects a degree of underlying emphysematous change. There is extensive lymph node calcification diffusely, stable. There is degenerative change in the thoracic spine.  IMPRESSION: New right mid lung region pneumonia. There is a degree of underlying emphysematous  change. Multiple calcified lymph nodes are noted. Patient has a history of histoplasmosis. Followup PA and lateral chest radiographs recommended in 3-4 weeks following trial of antibiotic therapy to ensure resolution and exclude underlying malignancy.   Electronically Signed   By: Lowella Grip III M.D.   On: 06/23/2015 10:58   Dg Chest Portable 1 View  06/19/2015   CLINICAL DATA:  At sepsis.  CVD.  EXAM: PORTABLE CHEST - 1 VIEW  COMPARISON:  Acute abdominal series 06/12/2015. CT of the chest 04/23/2015.  FINDINGS: The heart is mildly enlarged. Extensive calcified mediastinal and hilar adenopathy is again noted. The left pleural effusion and basilar airspace disease has increased. A small right pleural effusion is present. Coronary artery calcification or stents are evident.  IMPRESSION: 1. Increasing left pleural effusion and basilar airspace disease. While this may represent atelectasis, infection is also considered. 2. Stable diffuse calcified adenopathy.   Electronically Signed   By: San Morelle M.D.   On: 06/19/2015 18:48   Dg Abd Acute W/chest  06/12/2015   CLINICAL DATA:  Pt here with abd discomfort/constipation x2 weeks. Was told he has an impaction. Has had multiple enemas recently. Denies prior surgery to abd  EXAM: DG ABDOMEN ACUTE W/ 1V CHEST  COMPARISON:  05/24/2015  FINDINGS: There is no bowel dilation to suggest obstruction. On the decubitus views, there are scattered air-fluid levels. This may reflect a mild adynamic ileus. It can be seen in setting of gastroenteritis. It may be due to the multiple  recent enemas as described in history.  There is no free air.  Densities project in the right upper quadrant suggesting gallstones. There are aortoiliac and branch vessel vascular calcifications. Splenic artery calcifications are noted in the left upper quadrant. Soft tissues are otherwise unremarkable.  No acute bony abnormality.  Frontal chest radiograph demonstrates numerous calcified mediastinal and hilar lymph nodes, stable. There are areas of lung scarring and emphysema. No acute findings in the lungs. No pleural effusion or pneumothorax.  IMPRESSION: 1. No evidence of bowel obstruction or generalized adynamic ileus. No significant increased stool. 2. Nonspecific air-fluid levels within nondistended bowel on the decubitus views. This may be due to gastroenteritis or from the multiple enemas. 3. No acute cardiopulmonary disease.   Electronically Signed   By: Lajean Manes M.D.   On: 06/12/2015 17:04    Microbiology: Recent Results (from the past 240 hour(s))  Blood Culture (routine x 2)     Status: None   Collection Time: 06/19/15  4:49 PM  Result Value Ref Range Status   Specimen Description BLOOD RIGHT FOREARM  Final   Special Requests BOTTLES DRAWN AEROBIC AND ANAEROBIC 4.5CC  Final   Culture   Final    NO GROWTH 5 DAYS Performed at [facility]    Report Status 06/24/2015 FINAL  Final  Blood Culture (routine x 2)     Status: None   Collection Time: 06/19/15  5:07 PM  Result Value Ref Range Status   Specimen Description BLOOD RIGHT HAND  Final   Special Requests BOTTLES DRAWN AEROBIC ONLY 3CC  Final   Culture   Final    NO GROWTH 5 DAYS Performed at [facility]    Report Status 06/24/2015 FINAL  Final  Urine culture     Status: None   Collection Time: 06/19/15  6:37 PM  Result Value Ref Range Status   Specimen Description URINE, CATHETERIZED  Final   Special Requests NONE  Final   Culture  Final    20,000 COLONIES/mL YEAST Performed at [facility]     Report Status 06/21/2015 FINAL  Final  C difficile quick scan w PCR reflex     Status: None   Collection Time: 06/20/15 11:00 AM  Result Value Ref Range Status   C Diff antigen NEGATIVE NEGATIVE Final   C Diff toxin NEGATIVE NEGATIVE Final   C Diff interpretation Negative for toxigenic C. difficile  Final     Labs: Basic Metabolic Panel:  Recent Labs Lab 06/20/15 0436 06/22/15 0800 06/23/15 0419 06/24/15 0827 06/25/15 0514  NA 140 137 136 139 139  K 4.2 4.4 4.7 4.8 4.4  CL 107 105 105 108 108  CO2 25 24 27 22 23   GLUCOSE 96 203* 182* 187* 175*  BUN 18 25* 31* 28* 24*  CREATININE 0.83 1.01 1.16 0.98 0.88  CALCIUM 8.7* 8.4* 8.2* 8.4* 8.3*   Liver Function Tests:  Recent Labs Lab 06/19/15 1649  AST 24  ALT 17  ALKPHOS 57  BILITOT 0.7  PROT 6.4*  ALBUMIN 3.4*   No results for input(s): LIPASE, AMYLASE in the last 168 hours. No results for input(s): AMMONIA in the last 168 hours. CBC:  Recent Labs Lab 06/19/15 1649  06/21/15 0500 06/22/15 0800 06/23/15 0419 06/24/15 0827 06/25/15 0514  WBC 5.5  < > 5.0 9.4 7.0 7.2 5.2  NEUTROABS 5.1  --   --   --   --  6.6 4.6  HGB 11.7*  < > 9.7* 10.2* 9.3* 10.1* 9.4*  HCT 35.4*  < > 29.5* 31.8* 28.3* 31.3* 29.3*  MCV 93.7  < > 93.4 92.7 94.0 94.8 92.1  PLT 194  < > 156 159 149* 169 159  < > = values in this interval not displayed. Cardiac Enzymes: No results for input(s): CKTOTAL, CKMB, CKMBINDEX, TROPONINI in the last 168 hours. BNP: BNP (last 3 results)  Recent Labs  04/17/15 2150 04/23/15 0022 04/28/15 1502  BNP 653.6* 464.5* 304.9*    ProBNP (last 3 results) No results for input(s): PROBNP in the last 8760 hours.  CBG:  Recent Labs Lab 06/24/15 0753 06/24/15 1134 06/24/15 1657 06/24/15 2050 06/25/15 0747  GLUCAP 126* 251* 170* 279* 150*       Signed:  Nita Sells  [facility] 06/25/2015, 9:18 AM

## 2015-06-25 NOTE — Evaluation (Signed)
Physical Therapy Evaluation Patient Details Name: [name] MRN: [id number] DOB: Mar 16, 1929 Today's Date: 06/25/2015   History of Present Illness  [name] is a 81 y.o. male with h/o recurrent UTIs, recent admit for E.coli UTI with E.coli bacteremia last month. Patient presents to ED 8/12/16with  increased confusion. Suprapubic tenderness, and 1 day history of R flank pain / tenderness  Clinical Impression  Patient is very rigid and resistive to standing at the edge of the bed. Patient is not functioning at a level to safely transfer into a car for Dc. Recommend non emergency transport to home.. Patient will benefit from trial of acute PT to address problems listed in note below.  Follow Up Recommendations Home health PT;Supervision/Assistance - 24 hour    Equipment Recommendations  None recommended by PT    Recommendations for Other Services       Precautions / Restrictions Precautions Precautions: Fall      Mobility  Bed Mobility Overal bed mobility: Needs Assistance;+2 for physical assistance;+ 2 for safety/equipment Bed Mobility: Supine to Sit;Sit to Supine     Supine to sit: Total assist;+2 for physical assistance;+2 for safety/equipment Sit to supine: Total assist;+2 for physical assistance;+2 for safety/equipment   General bed mobility comments: pt pushes into extension  Transfers Overall transfer level: Needs assistance Equipment used: 2 person hand held assist             General transfer comment: patient does not stand , pushes into extension. Attempted x 2 with no success.  Ambulation/Gait                Stairs            Wheelchair Mobility    Modified Rankin (Stroke Patients Only)       Balance Overall balance assessment: Needs assistance;History of Falls Sitting-balance support: Feet supported;Bilateral upper extremity supported Sitting balance-Leahy Scale: Poor                                        Pertinent Vitals/Pain Pain Assessment: Faces Faces Pain Scale: Hurts little more Pain Location: legs when touched. Pain Descriptors / Indicators: Discomfort Pain Intervention(s): Monitored during session;Limited activity within patient's tolerance    Home Living Family/patient expects to be discharged to:: Private residence Living Arrangements: Non-relatives/Friends Available Help at Discharge: Friend(s);Personal care attendant;Available 24 hours/day Type of Home: House Home Access: Stairs to enter Entrance Stairs-Rails: Right;Left;Can reach both Entrance Stairs-Number of Steps: 3 Home Layout: Two level;1/2 bath on main level;Able to live on main level with bedroom/bathroom Home Equipment: Gilford Rile - 2 wheels;Cane - single point;Bedside commode;Wheelchair - manual Additional Comments: per caregiveer, patient at home with 24 hour caregivers.    Prior Function Level of Independence: Needs assistance   Gait / Transfers Assistance Needed: per chart,pivots,           Hand Dominance        Extremity/Trunk Assessment               Lower Extremity Assessment: RLE deficits/detail;LLE deficits/detail RLE Deficits / Details: able to pick leg up bur does not bear weight, legs go ito extension LLE Deficits / Details: same     Communication      Cognition Arousal/Alertness: Awake/alert Behavior During Therapy: Anxious Overall Cognitive Status: History of cognitive impairments - at baseline  General Comments      Exercises        Assessment/Plan    PT Assessment Patient needs continued PT services  PT Diagnosis Generalized weakness;Acute pain;Altered mental status   PT Problem List Decreased strength;Decreased range of motion;Decreased activity tolerance;Decreased balance;Decreased mobility;Decreased safety awareness  PT Treatment Interventions Functional mobility training;Therapeutic activities;Patient/family education   PT Goals  (Current goals can be found in the Care Plan section) Acute Rehab PT Goals PT Goal Formulation: Patient unable to participate in goal setting Time For Goal Achievement: 07/09/15 Potential to Achieve Goals: Fair    Frequency Min 2X/week   Barriers to discharge        Co-evaluation               End of Session   Activity Tolerance: Patient tolerated treatment well Patient left: in bed;with call bell/phone within reach;with bed alarm set;with nursing/sitter in room Nurse Communication: Mobility status         Time: 1031-5945 PT Time Calculation (min) (ACUTE ONLY): 22 min   Charges:   PT Evaluation $Initial PT Evaluation Tier I: 1 Procedure     PT G CodesClaretha Cooper 06/25/2015, 4:44 PM Tresa Endo PT [phone]

## 2015-06-25 NOTE — Progress Notes (Signed)
Pt d/c to home with caregiver POA, explained porc. Placed in wheelchair by caregiver POA and personal sitter, placed in care by caregiver POa and personal sitter. Tol well, pt talking and stable. SRP, RN

## 2015-06-30 ENCOUNTER — Inpatient Hospital Stay (HOSPITAL_COMMUNITY)
Admission: EM | Admit: 2015-06-30 | Discharge: 2015-07-03 | DRG: 177 | Disposition: A | Discharge status: Skilled Nursing Facility | Payer: Medicare Other | Attending: Internal Medicine | Admitting: Internal Medicine

## 2015-06-30 ENCOUNTER — Encounter (HOSPITAL_COMMUNITY): Payer: Self-pay | Admitting: *Deleted

## 2015-06-30 ENCOUNTER — Emergency Department (HOSPITAL_COMMUNITY): Payer: Medicare Other

## 2015-06-30 DIAGNOSIS — J411 Mucopurulent chronic bronchitis: Secondary | ICD-10-CM | POA: Diagnosis not present

## 2015-06-30 DIAGNOSIS — J690 Pneumonitis due to inhalation of food and vomit: Principal | ICD-10-CM | POA: Diagnosis present

## 2015-06-30 DIAGNOSIS — J985 Diseases of mediastinum, not elsewhere classified: Secondary | ICD-10-CM | POA: Diagnosis present

## 2015-06-30 DIAGNOSIS — Z7952 Long term (current) use of systemic steroids: Secondary | ICD-10-CM | POA: Diagnosis not present

## 2015-06-30 DIAGNOSIS — M25552 Pain in left hip: Secondary | ICD-10-CM | POA: Diagnosis present

## 2015-06-30 DIAGNOSIS — F329 Major depressive disorder, single episode, unspecified: Secondary | ICD-10-CM | POA: Diagnosis present

## 2015-06-30 DIAGNOSIS — Z836 Family history of other diseases of the respiratory system: Secondary | ICD-10-CM | POA: Diagnosis not present

## 2015-06-30 DIAGNOSIS — I5032 Chronic diastolic (congestive) heart failure: Secondary | ICD-10-CM | POA: Diagnosis present

## 2015-06-30 DIAGNOSIS — R531 Weakness: Secondary | ICD-10-CM

## 2015-06-30 DIAGNOSIS — I252 Old myocardial infarction: Secondary | ICD-10-CM | POA: Diagnosis not present

## 2015-06-30 DIAGNOSIS — I2510 Atherosclerotic heart disease of native coronary artery without angina pectoris: Secondary | ICD-10-CM | POA: Diagnosis present

## 2015-06-30 DIAGNOSIS — Z6822 Body mass index (BMI) 22.0-22.9, adult: Secondary | ICD-10-CM

## 2015-06-30 DIAGNOSIS — E785 Hyperlipidemia, unspecified: Secondary | ICD-10-CM | POA: Diagnosis present

## 2015-06-30 DIAGNOSIS — Z955 Presence of coronary angioplasty implant and graft: Secondary | ICD-10-CM

## 2015-06-30 DIAGNOSIS — A419 Sepsis, unspecified organism: Secondary | ICD-10-CM | POA: Diagnosis present

## 2015-06-30 DIAGNOSIS — N390 Urinary tract infection, site not specified: Secondary | ICD-10-CM | POA: Diagnosis present

## 2015-06-30 DIAGNOSIS — E1165 Type 2 diabetes mellitus with hyperglycemia: Secondary | ICD-10-CM | POA: Diagnosis present

## 2015-06-30 DIAGNOSIS — B399 Histoplasmosis, unspecified: Secondary | ICD-10-CM | POA: Diagnosis present

## 2015-06-30 DIAGNOSIS — J9859 Other diseases of mediastinum, not elsewhere classified: Secondary | ICD-10-CM | POA: Diagnosis present

## 2015-06-30 DIAGNOSIS — I10 Essential (primary) hypertension: Secondary | ICD-10-CM | POA: Diagnosis present

## 2015-06-30 DIAGNOSIS — Z888 Allergy status to other drugs, medicaments and biological substances status: Secondary | ICD-10-CM

## 2015-06-30 DIAGNOSIS — J449 Chronic obstructive pulmonary disease, unspecified: Secondary | ICD-10-CM | POA: Diagnosis present

## 2015-06-30 DIAGNOSIS — Z7982 Long term (current) use of aspirin: Secondary | ICD-10-CM | POA: Diagnosis not present

## 2015-06-30 DIAGNOSIS — R0602 Shortness of breath: Secondary | ICD-10-CM

## 2015-06-30 DIAGNOSIS — J410 Simple chronic bronchitis: Secondary | ICD-10-CM | POA: Diagnosis not present

## 2015-06-30 DIAGNOSIS — K219 Gastro-esophageal reflux disease without esophagitis: Secondary | ICD-10-CM | POA: Diagnosis present

## 2015-06-30 DIAGNOSIS — F0390 Unspecified dementia without behavioral disturbance: Secondary | ICD-10-CM | POA: Diagnosis present

## 2015-06-30 DIAGNOSIS — Z7902 Long term (current) use of antithrombotics/antiplatelets: Secondary | ICD-10-CM | POA: Diagnosis not present

## 2015-06-30 DIAGNOSIS — M25559 Pain in unspecified hip: Secondary | ICD-10-CM

## 2015-06-30 DIAGNOSIS — E440 Moderate protein-calorie malnutrition: Secondary | ICD-10-CM | POA: Diagnosis present

## 2015-06-30 DIAGNOSIS — M1388 Other specified arthritis, other site: Secondary | ICD-10-CM | POA: Diagnosis present

## 2015-06-30 DIAGNOSIS — IMO0002 Reserved for concepts with insufficient information to code with codable children: Secondary | ICD-10-CM | POA: Diagnosis present

## 2015-06-30 DIAGNOSIS — Z8249 Family history of ischemic heart disease and other diseases of the circulatory system: Secondary | ICD-10-CM

## 2015-06-30 DIAGNOSIS — J189 Pneumonia, unspecified organism: Secondary | ICD-10-CM

## 2015-06-30 DIAGNOSIS — Z8546 Personal history of malignant neoplasm of prostate: Secondary | ICD-10-CM | POA: Diagnosis not present

## 2015-06-30 DIAGNOSIS — I255 Ischemic cardiomyopathy: Secondary | ICD-10-CM | POA: Diagnosis present

## 2015-06-30 DIAGNOSIS — Z66 Do not resuscitate: Secondary | ICD-10-CM | POA: Diagnosis present

## 2015-06-30 DIAGNOSIS — Z85828 Personal history of other malignant neoplasm of skin: Secondary | ICD-10-CM

## 2015-06-30 DIAGNOSIS — R339 Retention of urine, unspecified: Secondary | ICD-10-CM | POA: Diagnosis present

## 2015-06-30 DIAGNOSIS — N289 Disorder of kidney and ureter, unspecified: Secondary | ICD-10-CM | POA: Diagnosis present

## 2015-06-30 DIAGNOSIS — Z79899 Other long term (current) drug therapy: Secondary | ICD-10-CM | POA: Diagnosis not present

## 2015-06-30 HISTORY — DX: Pneumonia, unspecified organism: J18.9

## 2015-06-30 LAB — CBC
HEMATOCRIT: 34.4 % — AB (ref 39.0–52.0)
Hemoglobin: 11.2 g/dL — ABNORMAL LOW (ref 13.0–17.0)
MCH: 29.7 pg (ref 26.0–34.0)
MCHC: 32.6 g/dL (ref 30.0–36.0)
MCV: 91.2 fL (ref 78.0–100.0)
PLATELETS: 198 10*3/uL (ref 150–400)
RBC: 3.77 MIL/uL — ABNORMAL LOW (ref 4.22–5.81)
RDW: 16.1 % — AB (ref 11.5–15.5)
WBC: 5.8 10*3/uL (ref 4.0–10.5)

## 2015-06-30 LAB — URINALYSIS, ROUTINE W REFLEX MICROSCOPIC
BILIRUBIN URINE: NEGATIVE
Glucose, UA: 100 mg/dL — AB
HGB URINE DIPSTICK: NEGATIVE
KETONES UR: NEGATIVE mg/dL
NITRITE: NEGATIVE
PH: 8 (ref 5.0–8.0)
Protein, ur: 30 mg/dL — AB
SPECIFIC GRAVITY, URINE: 1.016 (ref 1.005–1.030)
Urobilinogen, UA: 1 mg/dL (ref 0.0–1.0)

## 2015-06-30 LAB — BASIC METABOLIC PANEL
Anion gap: 6 (ref 5–15)
BUN: 20 mg/dL (ref 6–20)
CHLORIDE: 106 mmol/L (ref 101–111)
CO2: 25 mmol/L (ref 22–32)
CREATININE: 1.06 mg/dL (ref 0.61–1.24)
Calcium: 8.6 mg/dL — ABNORMAL LOW (ref 8.9–10.3)
GFR calc Af Amer: >60 mL/min (ref >60)
GLUCOSE: 190 mg/dL — AB (ref 65–99)
POTASSIUM: 4.5 mmol/L (ref 3.5–5.1)
SODIUM: 137 mmol/L (ref 135–145)

## 2015-06-30 LAB — URINE MICROSCOPIC-ADD ON

## 2015-06-30 LAB — I-STAT CG4 LACTIC ACID, ED
LACTIC ACID, VENOUS: 2.66 mmol/L — AB (ref 0.5–2.0)
Lactic Acid, Venous: 2.52 mmol/L (ref 0.5–2.0)

## 2015-06-30 LAB — CBG MONITORING, ED: Glucose-Capillary: 169 mg/dL — ABNORMAL HIGH (ref 65–99)

## 2015-06-30 MED ORDER — VANCOMYCIN HCL IN DEXTROSE 1-5 GM/200ML-% IV SOLN
1000.0000 mg | Freq: Once | INTRAVENOUS | Status: AC
  Administered 2015-06-30: 1000 mg via INTRAVENOUS
  Filled 2015-06-30: qty 200

## 2015-06-30 MED ORDER — SODIUM CHLORIDE 0.9 % IV BOLUS (SEPSIS)
1000.0000 mL | Freq: Once | INTRAVENOUS | Status: AC
  Administered 2015-06-30: 1000 mL via INTRAVENOUS

## 2015-06-30 MED ORDER — ACETAMINOPHEN 500 MG PO TABS
1000.0000 mg | ORAL_TABLET | Freq: Once | ORAL | Status: AC
  Administered 2015-06-30: 1000 mg via ORAL
  Filled 2015-06-30: qty 2

## 2015-06-30 MED ORDER — PIPERACILLIN-TAZOBACTAM 3.375 G IVPB 30 MIN
3.3750 g | Freq: Once | INTRAVENOUS | Status: AC
  Administered 2015-06-30: 3.375 g via INTRAVENOUS
  Filled 2015-06-30: qty 50

## 2015-06-30 NOTE — ED Notes (Signed)
Pt was discharged last Monday from [facility] with Pneumonia. Pt was given an IV antibiotic and he was doing well.  Pt has not been acting himself for the last week.  CBG 242. BP 132/76, HR 72, and 97% RA. Finished round of levaquin today.  Normally he is an active person.  Pt has pain to touch all over.  Pt arrived with indwelling urine cath placed one week ago.  Pt is from home.
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
## 2015-06-30 NOTE — ED Notes (Signed)
Attempted report 
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
## 2015-07-01 ENCOUNTER — Encounter (HOSPITAL_COMMUNITY): Payer: Self-pay

## 2015-07-01 DIAGNOSIS — J410 Simple chronic bronchitis: Secondary | ICD-10-CM

## 2015-07-01 DIAGNOSIS — J690 Pneumonitis due to inhalation of food and vomit: Principal | ICD-10-CM

## 2015-07-01 DIAGNOSIS — E1165 Type 2 diabetes mellitus with hyperglycemia: Secondary | ICD-10-CM

## 2015-07-01 DIAGNOSIS — E440 Moderate protein-calorie malnutrition: Secondary | ICD-10-CM

## 2015-07-01 LAB — CORTISOL-AM, BLOOD: Cortisol - AM: 4.1 ug/dL — ABNORMAL LOW (ref 6.7–22.6)

## 2015-07-01 LAB — GLUCOSE, CAPILLARY
GLUCOSE-CAPILLARY: 106 mg/dL — AB (ref 65–99)
Glucose-Capillary: 131 mg/dL — ABNORMAL HIGH (ref 65–99)
Glucose-Capillary: 143 mg/dL — ABNORMAL HIGH (ref 65–99)
Glucose-Capillary: 211 mg/dL — ABNORMAL HIGH (ref 65–99)

## 2015-07-01 LAB — PROCALCITONIN: Procalcitonin: <0.1 ng/mL

## 2015-07-01 LAB — BRAIN NATRIURETIC PEPTIDE: B Natriuretic Peptide: 344.9 pg/mL — ABNORMAL HIGH (ref 0.0–100.0)

## 2015-07-01 LAB — PROTIME-INR
INR: 1.1 (ref 0.00–1.49)
PROTHROMBIN TIME: 14.4 s (ref 11.6–15.2)

## 2015-07-01 LAB — LACTIC ACID, PLASMA
LACTIC ACID, VENOUS: 1.6 mmol/L (ref 0.5–2.0)
LACTIC ACID, VENOUS: 1.6 mmol/L (ref 0.5–2.0)

## 2015-07-01 LAB — APTT: aPTT: 25 seconds (ref 24–37)

## 2015-07-01 MED ORDER — ACETAMINOPHEN 325 MG PO TABS: 650.0000 mg | ORAL_TABLET | Freq: Four times a day (QID) | ORAL | Status: DC | PRN

## 2015-07-01 MED ORDER — PIPERACILLIN-TAZOBACTAM 3.375 G IVPB
3.3750 g | Freq: Three times a day (TID) | INTRAVENOUS | Status: DC
  Administered 2015-07-01 – 2015-07-03 (×8): 3.375 g via INTRAVENOUS
  Filled 2015-07-01 (×10): qty 50

## 2015-07-01 MED ORDER — HEPARIN SODIUM (PORCINE) 5000 UNIT/ML IJ SOLN
5000.0000 [IU] | Freq: Three times a day (TID) | INTRAMUSCULAR | Status: DC
  Administered 2015-07-01 – 2015-07-03 (×7): 5000 [IU] via SUBCUTANEOUS
  Filled 2015-07-01 (×7): qty 1

## 2015-07-01 MED ORDER — ALBUTEROL SULFATE (2.5 MG/3ML) 0.083% IN NEBU: 2.5000 mg | INHALATION_SOLUTION | RESPIRATORY_TRACT | Status: DC | PRN

## 2015-07-01 MED ORDER — TAMSULOSIN HCL 0.4 MG PO CAPS
0.4000 mg | ORAL_CAPSULE | Freq: Every day | ORAL | Status: DC
  Administered 2015-07-01 – 2015-07-03 (×3): 0.4 mg via ORAL
  Filled 2015-07-01 (×3): qty 1

## 2015-07-01 MED ORDER — INSULIN ASPART 100 UNIT/ML ~~LOC~~ SOLN
0.0000 [IU] | Freq: Three times a day (TID) | SUBCUTANEOUS | Status: DC
Start: 2015-07-01 — End: 2015-07-03
  Administered 2015-07-01 (×2): 1 [IU] via SUBCUTANEOUS
  Administered 2015-07-02 (×2): 3 [IU] via SUBCUTANEOUS
  Administered 2015-07-02: 5 [IU] via SUBCUTANEOUS
  Administered 2015-07-03: 1 [IU] via SUBCUTANEOUS
  Administered 2015-07-03: 3 [IU] via SUBCUTANEOUS
  Administered 2015-07-03: 2 [IU] via SUBCUTANEOUS

## 2015-07-01 MED ORDER — PIPERACILLIN-TAZOBACTAM 3.375 G IVPB 30 MIN: 3.3750 g | Freq: Three times a day (TID) | INTRAVENOUS | Status: DC

## 2015-07-01 MED ORDER — VANCOMYCIN HCL IN DEXTROSE 1-5 GM/200ML-% IV SOLN
1000.0000 mg | INTRAVENOUS | Status: DC
  Administered 2015-07-01: 1000 mg via INTRAVENOUS
  Filled 2015-07-01 (×2): qty 200

## 2015-07-01 MED ORDER — SERTRALINE HCL 50 MG PO TABS
50.0000 mg | ORAL_TABLET | Freq: Every day | ORAL | Status: DC
  Administered 2015-07-01 – 2015-07-02 (×2): 50 mg via ORAL
  Filled 2015-07-01 (×2): qty 1

## 2015-07-01 MED ORDER — GLUCERNA SHAKE PO LIQD
237.0000 mL | Freq: Three times a day (TID) | ORAL | Status: DC
  Administered 2015-07-01 – 2015-07-03 (×5): 237 mL via ORAL
  Filled 2015-07-01 (×2): qty 237

## 2015-07-01 MED ORDER — CLOPIDOGREL BISULFATE 75 MG PO TABS
75.0000 mg | ORAL_TABLET | Freq: Every day | ORAL | Status: DC
  Administered 2015-07-01 – 2015-07-03 (×3): 75 mg via ORAL
  Filled 2015-07-01 (×3): qty 1

## 2015-07-01 MED ORDER — PREDNISONE 5 MG PO TABS
25.0000 mg | ORAL_TABLET | Freq: Every day | ORAL | Status: DC
  Administered 2015-07-02 – 2015-07-03 (×2): 25 mg via ORAL
  Filled 2015-07-01 (×4): qty 1

## 2015-07-01 MED ORDER — DOCUSATE SODIUM 100 MG PO CAPS
100.0000 mg | ORAL_CAPSULE | Freq: Two times a day (BID) | ORAL | Status: DC
  Administered 2015-07-01 – 2015-07-03 (×4): 100 mg via ORAL
  Filled 2015-07-01 (×5): qty 1

## 2015-07-01 MED ORDER — ACETAMINOPHEN 325 MG PO TABS: 325.0000 mg | ORAL_TABLET | Freq: Four times a day (QID) | ORAL | Status: DC | PRN

## 2015-07-01 MED ORDER — SODIUM CHLORIDE 0.9 % IJ SOLN
3.0000 mL | Freq: Two times a day (BID) | INTRAMUSCULAR | Status: DC
  Administered 2015-07-01 – 2015-07-03 (×3): 3 mL via INTRAVENOUS

## 2015-07-01 MED ORDER — ACETAMINOPHEN 650 MG RE SUPP: 650.0000 mg | Freq: Four times a day (QID) | RECTAL | Status: DC | PRN

## 2015-07-01 MED ORDER — BISACODYL 10 MG RE SUPP: 10.0000 mg | Freq: Every day | RECTAL | Status: DC | PRN

## 2015-07-01 MED ORDER — MORPHINE SULFATE (CONCENTRATE) 10 MG/0.5ML PO SOLN
5.0000 mg | ORAL | Status: DC | PRN
  Filled 2015-07-01: qty 0.5

## 2015-07-01 MED ORDER — TIOTROPIUM BROMIDE MONOHYDRATE 18 MCG IN CAPS: 18.0000 ug | ORAL_CAPSULE | Freq: Every day | RESPIRATORY_TRACT | Status: DC | PRN

## 2015-07-01 MED ORDER — IPRATROPIUM-ALBUTEROL 0.5-2.5 (3) MG/3ML IN SOLN
3.0000 mL | Freq: Two times a day (BID) | RESPIRATORY_TRACT | Status: DC
  Administered 2015-07-01: 3 mL via RESPIRATORY_TRACT
  Filled 2015-07-01: qty 3

## 2015-07-01 MED ORDER — SODIUM CHLORIDE 0.9 % IV SOLN
INTRAVENOUS | Status: DC
  Administered 2015-07-01: 01:00:00 via INTRAVENOUS

## 2015-07-01 MED ORDER — HYDROCORTISONE NA SUCCINATE PF 100 MG IJ SOLR
50.0000 mg | Freq: Once | INTRAMUSCULAR | Status: AC
  Administered 2015-07-01: 50 mg via INTRAVENOUS
  Filled 2015-07-01: qty 2

## 2015-07-01 MED ORDER — LACTULOSE 10 GM/15ML PO SOLN
10.0000 g | Freq: Two times a day (BID) | ORAL | Status: DC
  Administered 2015-07-01 – 2015-07-03 (×5): 10 g via ORAL
  Filled 2015-07-01 (×5): qty 15

## 2015-07-01 MED ORDER — IPRATROPIUM-ALBUTEROL 0.5-2.5 (3) MG/3ML IN SOLN
3.0000 mL | Freq: Three times a day (TID) | RESPIRATORY_TRACT | Status: DC
  Administered 2015-07-01 – 2015-07-02 (×3): 3 mL via RESPIRATORY_TRACT
  Filled 2015-07-01 (×3): qty 3

## 2015-07-01 MED ORDER — POLYETHYLENE GLYCOL 3350 17 G PO PACK: 17.0000 g | PACK | Freq: Every day | ORAL | Status: DC | PRN

## 2015-07-01 MED ORDER — ASPIRIN 81 MG PO CHEW
81.0000 mg | CHEWABLE_TABLET | Freq: Every day | ORAL | Status: DC
  Administered 2015-07-01 – 2015-07-03 (×3): 81 mg via ORAL
  Filled 2015-07-01 (×3): qty 1

## 2015-07-01 MED ORDER — PANTOPRAZOLE SODIUM 40 MG PO PACK
20.0000 mg | PACK | Freq: Every day | ORAL | Status: DC
  Administered 2015-07-01 – 2015-07-02 (×2): 20 mg via ORAL
  Administered 2015-07-03: 10 mg via ORAL
  Filled 2015-07-01 (×4): qty 20

## 2015-07-01 MED ORDER — SACCHAROMYCES BOULARDII 250 MG PO CAPS
250.0000 mg | ORAL_CAPSULE | Freq: Two times a day (BID) | ORAL | Status: DC
  Administered 2015-07-01 – 2015-07-03 (×5): 250 mg via ORAL
  Filled 2015-07-01 (×6): qty 1

## 2015-07-01 NOTE — Progress Notes (Signed)
Received patient to room 5W1 from ED via stretcher, report received from [place]. Patient transferred from stretcher to bed, incontinent of stool, cleaned and repositioned. POA at bedside, IVF infusing to left hand without problems, site clear, foley patent. Tele box applied and monitor room notified of patient arrival.  Noted decub just above crack of buttocks - see assessment for size, cleansed and foam applied. Noted abrasion to right knee and multiple bruises to hands, arms, legs. Assessment/admission paperwork complete. Patient/POA oriented to room, call bell, bed controls, fall protocol, patient guide book and all orders with verbal understanding. Denies pain or problems at this time. Will monitor.

## 2015-07-01 NOTE — Progress Notes (Signed)
PT Cancellation Note  Patient Details Name: [name] MRN: [id number] DOB: 05-09-1929   Cancelled Treatment:    Reason Eval/Treat Not Completed: Other (comment).  Pt eating (very slowly) with total assist from his aide (from home).  He refused his other therapies today.  PT will check back in AM.  Aide states that he has been only able to transfer from bed to chair for several weeks now, but was previously ambulatory with RW.   Thanks,    Barbarann Ehlers. Bristal Steffy, PT, DPT [phone]   07/01/2015, 6:30 PM

## 2015-07-01 NOTE — Progress Notes (Signed)
[facility] consult placed.

## 2015-07-01 NOTE — Progress Notes (Addendum)
PATIENT DETAILS Name: [name] Age: 81 y.o. Sex: male Date of Birth: Apr 18, 1929 Admit Date: 06/30/2015 Admitting Physician Ivor Costa, MD PCP:No primary care provider on file.  Subjective: Awake alert-claims that he is still weak. But no other complaints.  Assessment/Plan: Principal Problem: Aspiration pneumonia/HCAP: Suspect generalized weakness more from suspected aspiration pneumonia rather than UTI. Continue with vancomycin/Zosyn, suspect such as will be negative as was recently on levofloxacin. Await SLP evaluation  Active Problems: Diabetes mellitus type 2, uncontrolled: CBGs currently stable with SSI-resume metformin on discharge.  GERD: Continue PPI  History of CAD-s/p PCI 2006: Without chest pain or shortness of breath. Continue antiplatelets.  History of urinary retention: Foley catheter currently in place, continue Flomax. Failed voiding trial last admission-Will need outpatient follow-up with urologist.  History of COPD: Lungs clear-continue with as needed bronchodilators. On chronic prednisone 25 mg daily.  Chronic diastolic CHF (congestive heart failure): Clinically compensated, stop IV fluids  Suspected mild dementia: Supportive care  Malnutrition of moderate degree  Generalized weakness: Multifactorial, secondary to probable ongoing aspiration pneumonitis, frequent hospitalizations and frailty.  Disposition: Remain inpatient  Antimicrobial agents  See below  Anti-infectives    Start     Dose/Rate Route Frequency Ordered Stop   07/01/15 2200  vancomycin (VANCOCIN) IVPB 1000 mg/200 mL premix     1,000 mg 200 mL/hr over 60 Minutes Intravenous Every 24 hours 07/01/15 0054     07/01/15 0600  piperacillin-tazobactam (ZOSYN) IVPB 3.375 g     3.375 g 12.5 mL/hr over 240 Minutes Intravenous 3 times per day 07/01/15 0045     07/01/15 0045  piperacillin-tazobactam (ZOSYN) IVPB 3.375 g  Status:  Discontinued     3.375 g 100 mL/hr over  30 Minutes Intravenous 3 times per day 07/01/15 0043 07/01/15 0044   06/30/15 2100  piperacillin-tazobactam (ZOSYN) IVPB 3.375 g     3.375 g 100 mL/hr over 30 Minutes Intravenous  Once 06/30/15 2048 06/30/15 2340   06/30/15 2100  vancomycin (VANCOCIN) IVPB 1000 mg/200 mL premix     1,000 mg 200 mL/hr over 60 Minutes Intravenous  Once 06/30/15 2048 07/01/15 0049      DVT Prophylaxis: Prophylactic  Heparin   Code Status: DNR  Family Communication Estrella Deeds  Procedures: None  CONSULTS:  None  Time spent 30 minutes-Greater than 50% of this time was spent in counseling, explanation of diagnosis, planning of further management, and coordination of care.  MEDICATIONS: Scheduled Meds: . aspirin  81 mg Oral q1800  . clopidogrel  75 mg Oral Daily  . docusate sodium  100 mg Oral BID  . feeding supplement (GLUCERNA SHAKE)  237 mL Oral TID BM  . heparin  5,000 Units Subcutaneous 3 times per day  . insulin aspart  0-9 Units Subcutaneous TID WC  . ipratropium-albuterol  3 mL Nebulization BID  . lactulose  10 g Oral BID  . pantoprazole sodium  20 mg Oral Daily  . piperacillin-tazobactam (ZOSYN)  IV  3.375 g Intravenous 3 times per day  . [START ON 07/02/2015] predniSONE  25 mg Oral Q breakfast  . saccharomyces boulardii  250 mg Oral BID  . sertraline  50 mg Oral QHS  . sodium chloride  3 mL Intravenous Q12H  . tamsulosin  0.4 mg Oral Daily  . vancomycin  1,000 mg Intravenous Q24H   Continuous Infusions: . sodium chloride 75 mL/hr at 07/01/15 0126   PRN  Meds:.acetaminophen **OR** acetaminophen, albuterol, bisacodyl, morphine CONCENTRATE, polyethylene glycol, tiotropium    PHYSICAL EXAM: Vital signs in last 24 hours: Filed Vitals:   07/01/15 0057 07/01/15 0700 07/01/15 0704 07/01/15 0940  BP: 159/74 139/56    Pulse: 63 68    Temp: 97.5 F (36.4 C) 97.7 F (36.5 C)    TempSrc: Oral Oral    Resp: 20 20    Height: 5\' 5"  (1.651 m)     Weight: 58.968 kg (130 lb)  57.8  kg (127 lb 6.8 oz)   SpO2: 99% 96%  99%    Weight change:  Filed Weights   07/01/15 0057 07/01/15 0704  Weight: 58.968 kg (130 lb) 57.8 kg (127 lb 6.8 oz)   Body mass index is 21.2 kg/(m^2).   Gen Exam: Awake and alert with clear speech.   Neck: Supple, No JVD.   Chest: B/L Clear.   CVS: S1 S2 Regular, no murmurs.  Abdomen: soft, BS +, non tender, non distended.  Extremities: no edema, lower extremities warm to touch. Neurologic: Non Focal.   Skin: No Rash.   Wounds: N/A.   Intake/Output from previous day:  Intake/Output Summary (Last 24 hours) at 07/01/15 1336 Last data filed at 07/01/15 0902  Gross per 24 hour  Intake  437.5 ml  Output   1301 ml  Net -863.5 ml     LAB RESULTS: CBC  Recent Labs Lab 06/25/15 0514 06/30/15 1951  WBC 5.2 5.8  HGB 9.4* 11.2*  HCT 29.3* 34.4*  PLT 159 198  MCV 92.1 91.2  MCH 29.6 29.7  MCHC 32.1 32.6  RDW 16.4* 16.1*  LYMPHSABS 0.2*  --   MONOABS 0.4  --   EOSABS 0.0  --   BASOSABS 0.0  --     Chemistries   Recent Labs Lab 06/25/15 0514 06/30/15 1951  NA 139 137  K 4.4 4.5  CL 108 106  CO2 23 25  GLUCOSE 175* 190*  BUN 24* 20  CREATININE 0.88 1.06  CALCIUM 8.3* 8.6*    CBG:  Recent Labs Lab 06/25/15 1146 06/25/15 1649 06/30/15 2004 07/01/15 0808 07/01/15 1228  GLUCAP 231* 278* 169* 131* 143*    GFR Estimated Creatinine Clearance: 42.4 mL/min (by C-G formula based on Cr of 1.06).  Coagulation profile  Recent Labs Lab 07/01/15 0121  INR 1.10    Cardiac Enzymes No results for input(s): CKMB, TROPONINI, MYOGLOBIN in the last 168 hours.  Invalid input(s): CK  Invalid input(s): POCBNP No results for input(s): DDIMER in the last 72 hours. No results for input(s): HGBA1C in the last 72 hours. No results for input(s): CHOL, HDL, LDLCALC, TRIG, CHOLHDL, LDLDIRECT in the last 72 hours. No results for input(s): TSH, T4TOTAL, T3FREE, THYROIDAB in the last 72 hours.  Invalid input(s): FREET3 No  results for input(s): VITAMINB12, FOLATE, FERRITIN, TIBC, IRON, RETICCTPCT in the last 72 hours. No results for input(s): LIPASE, AMYLASE in the last 72 hours.  Urine Studies No results for input(s): UHGB, CRYS in the last 72 hours.  Invalid input(s): UACOL, UAPR, USPG, UPH, UTP, UGL, UKET, UBIL, UNIT, UROB, ULEU, UEPI, UWBC, URBC, UBAC, CAST, UCOM, BILUA  MICROBIOLOGY: No results found for this or any previous visit (from the past 240 hour(s)).  RADIOLOGY STUDIES/RESULTS: Ct Abdomen Pelvis W Contrast  06/19/2015   CLINICAL DATA:  Recurring urinary tract infection. Suprapubic tenderness. Right flank pain and tenderness. Confusion over the last 2 days.  EXAM: CT ABDOMEN AND PELVIS WITH CONTRAST  TECHNIQUE:  Multidetector CT imaging of the abdomen and pelvis was performed using the standard protocol following bolus administration of intravenous contrast.  CONTRAST:  139mL OMNIPAQUE IOHEXOL 300 MG/ML  SOLN  COMPARISON:  CT pelvis 05/24/2015. Abdominal radiographs 06/12/2015.  FINDINGS: Bilateral pleural effusions are present. There is associated atelectasis. A granuloma is present at the right lung base. Right lower lobe and right middle lobe bronchiectasis is evident.  The heart size is normal. Coronary artery calcifications are present. A small pericardial effusion is noted. Calcified paraesophageal lymph nodes are present.  The liver and spleen are within normal limits. Dense calcifications are present throughout the splenic artery. The stomach, duodenum, and pancreas are within normal limits. The common bile duct is within normal limits. A 13 mm gallstone is present. There is no focal inflammation to suggest cholecystitis.  The adrenal glands are normal bilaterally. A calcified mass at the upper pole of the right kidney measures 2.0 x 1.1 cm without significant change. Multiple bilateral cysts are present in both kidneys. There is no hydronephrosis. The prostate gland is irregular and extends into the  base of the urinary bladder. Cephalo caudad dimension is 5.4 cm.  Edematous changes surround the rectum. The rectum is less distended than on the prior exam. The more proximal sigmoid colon is within normal limits. The remainder the colon is unremarkable. The appendix is surgically absent. Small bowel is within normal limits. No significant adenopathy or free fluid is present.  Dense atherosclerotic calcifications are present in the abdominal aorta and branch vessels without aneurysm.  The bone windows demonstrate fusion of the SI joints bilaterally. No focal lytic or blastic lesions are present.  IMPRESSION: 1. Inflammatory changes about the rectum compatible with acute proctitis. 2. Enlarged and irregular prostate gland.  This is stable. 3. Extensive atherosclerotic changes. 4. Stable calcification in tip pole of the right kidney. 5. Progressive cystic changes in both kidneys. 6. Fusion of the SI joints. 7. Cholelithiasis without cholecystitis.   Electronically Signed   By: San Morelle M.D.   On: 06/19/2015 23:52   Dg Chest Portable 1 View  06/30/2015   CLINICAL DATA:  Pt caregiver states he has been feeling weak and lethargic x 23 weeks, was seen at [facility] last week, finished antibiotics, and seems to be getting worse with his lethargy,weakness and sleeps often, hx copd, non smoker, chf, was being treated for pneumonia  EXAM: PORTABLE CHEST - 1 VIEW  COMPARISON:  06/23/2015  FINDINGS: There is opacity in both lung bases likely combination of small pleural effusions with either atelectasis or infiltrate. Atelectasis favored.  Lungs are hyperexpanded. There are decreased markings in the upper lobes consistent with emphysema. There are areas of stable lung scarring.  There are multiple calcified lymph nodes along the mediastinum and hila, stable. No mediastinal or hilar masses.  Heart is borderline enlarged.  No pneumothorax.  Bony thorax is demineralized but grossly intact.  IMPRESSION: 1. Bilateral pleural  effusions, which have developed/day increased when compared to the prior study. 2. Associated parenchymal lung base opacity potentially due to pneumonia but more likely atelectasis. No pulmonary edema. 3. Underlying COPD. Multiple stable calcified mediastinal and hilar lymph nodes.   Electronically Signed   By: Lajean Manes M.D.   On: 06/30/2015 19:52   Dg Chest Port 1 View  06/23/2015   CLINICAL DATA:  Three-day history of cough  EXAM: PORTABLE CHEST - 1 VIEW  COMPARISON:  June 19, 2015  FINDINGS: Lungs remain hyperexpanded. There is new airspace opacity  in the right mid lung consistent with pneumonia. There is also underlying scarring in this area. No other areas of airspace consolidation identified. The heart size is within normal limits. Pulmonary vascularity reflects a degree of underlying emphysematous change. There is extensive lymph node calcification diffusely, stable. There is degenerative change in the thoracic spine.  IMPRESSION: New right mid lung region pneumonia. There is a degree of underlying emphysematous change. Multiple calcified lymph nodes are noted. Patient has a history of histoplasmosis. Followup PA and lateral chest radiographs recommended in 3-4 weeks following trial of antibiotic therapy to ensure resolution and exclude underlying malignancy.   Electronically Signed   By: Lowella Grip III M.D.   On: 06/23/2015 10:58   Dg Chest Portable 1 View  06/19/2015   CLINICAL DATA:  At sepsis.  CVD.  EXAM: PORTABLE CHEST - 1 VIEW  COMPARISON:  Acute abdominal series 06/12/2015. CT of the chest 04/23/2015.  FINDINGS: The heart is mildly enlarged. Extensive calcified mediastinal and hilar adenopathy is again noted. The left pleural effusion and basilar airspace disease has increased. A small right pleural effusion is present. Coronary artery calcification or stents are evident.  IMPRESSION: 1. Increasing left pleural effusion and basilar airspace disease. While this may represent  atelectasis, infection is also considered. 2. Stable diffuse calcified adenopathy.   Electronically Signed   By: San Morelle M.D.   On: 06/19/2015 18:48   Dg Abd Acute W/chest  06/12/2015   CLINICAL DATA:  Pt here with abd discomfort/constipation x2 weeks. Was told he has an impaction. Has had multiple enemas recently. Denies prior surgery to abd  EXAM: DG ABDOMEN ACUTE W/ 1V CHEST  COMPARISON:  05/24/2015  FINDINGS: There is no bowel dilation to suggest obstruction. On the decubitus views, there are scattered air-fluid levels. This may reflect a mild adynamic ileus. It can be seen in setting of gastroenteritis. It may be due to the multiple recent enemas as described in history.  There is no free air.  Densities project in the right upper quadrant suggesting gallstones. There are aortoiliac and branch vessel vascular calcifications. Splenic artery calcifications are noted in the left upper quadrant. Soft tissues are otherwise unremarkable.  No acute bony abnormality.  Frontal chest radiograph demonstrates numerous calcified mediastinal and hilar lymph nodes, stable. There are areas of lung scarring and emphysema. No acute findings in the lungs. No pleural effusion or pneumothorax.  IMPRESSION: 1. No evidence of bowel obstruction or generalized adynamic ileus. No significant increased stool. 2. Nonspecific air-fluid levels within nondistended bowel on the decubitus views. This may be due to gastroenteritis or from the multiple enemas. 3. No acute cardiopulmonary disease.   Electronically Signed   By: Lajean Manes M.D.   On: 06/12/2015 17:04    Oren Binet, MD  [facility] Pager:[phone]  If 7PM-7AM, please contact night-coverage [email or web address] Password [email or web address] 07/01/2015, 1:36 PM   LOS: 1 day

## 2015-07-01 NOTE — Progress Notes (Signed)
OT Cancellation Note  Patient Details Name: [name] MRN: [id number] DOB: [birth year]   Cancelled Treatment:    Reason Eval/Treat Not Completed: Pain limiting ability to participate;Patient declined, no reason specified.  Will re-attempt tomorrow.  Pt unable to state reason for not wanting to get up when asked.  Nursing and pt's personal assistant also attempting to persuade pt but he would not attempt.   Rhilyn Battle OTR/L 07/01/2015, 2:01 PM

## 2015-07-01 NOTE — Care Management Note (Signed)
Case Management Note  Patient Details  Name: [name] MRN: [id number] Date of Birth: 1929-05-24  Subjective/Objective:                 Spoke with patient and HHA at bedside. Patient lives with his friend POA [name], at her residence. Patient has a walker and a wheelchair, mostly uses wheelchair, hospital bed, has chronic foley for urinary retention and is incontinent of stool per HHA. Patient receives [facility] services through [place] [facility] home care with a nurse coming to the house twice a week, and HHA through private pay. Patient readmitted with UTI, recently discharged from [facility] with PNA.    Action/Plan:  Will continue to follow and offer resources and [facility] as needed, however SNF discharge anticipated per Dr Sloan Leiter during progression.  Expected Discharge Date:                  Expected Discharge Plan:  Skilled Nursing Facility  In-House Referral:  Clinical Social Work  Discharge planning Services  CM Consult  Post Acute Care Choice:    Choice offered to:     DME Arranged:    DME Agency:     HH Arranged:    [place] Agency:     Status of Service:  In process, will continue to follow  Medicare Important Message Given:    Date Medicare IM Given:    Medicare IM give by:    Date Additional Medicare IM Given:    Additional Medicare Important Message give by:     If discussed at [place] of Stay Meetings, dates discussed:    Additional Comments:  Alex Collet, RN 07/01/2015, 10:51 AM

## 2015-07-01 NOTE — Progress Notes (Signed)
SLP Cancellation Note  Patient Details Name: [name] MRN: [id number] DOB: August 13, 1929   Cancelled treatment:       Reason Eval/Treat Not Completed: Patient declined, no reason specified.  Pt refused POs for swallow assessment; RN confirmed has has eaten very little today.  Per record review, pt has hx of dysphagia (both esophageal and pharyngeal) with documented aspiration.  Will return next date and attempt assessment again.   [name] 07/01/2015, 2:20 PM
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
## 2015-07-01 NOTE — Progress Notes (Signed)
Utilization Review completed. Fredericka Bottcher RN BSN CM 

## 2015-07-02 ENCOUNTER — Inpatient Hospital Stay (HOSPITAL_COMMUNITY): Payer: Medicare Other

## 2015-07-02 DIAGNOSIS — R531 Weakness: Secondary | ICD-10-CM

## 2015-07-02 DIAGNOSIS — I2510 Atherosclerotic heart disease of native coronary artery without angina pectoris: Secondary | ICD-10-CM

## 2015-07-02 DIAGNOSIS — I5032 Chronic diastolic (congestive) heart failure: Secondary | ICD-10-CM

## 2015-07-02 DIAGNOSIS — N390 Urinary tract infection, site not specified: Secondary | ICD-10-CM

## 2015-07-02 LAB — CBC
HCT: 32.1 % — ABNORMAL LOW (ref 39.0–52.0)
Hemoglobin: 10.4 g/dL — ABNORMAL LOW (ref 13.0–17.0)
MCH: 29.6 pg (ref 26.0–34.0)
MCHC: 32.4 g/dL (ref 30.0–36.0)
MCV: 91.5 fL (ref 78.0–100.0)
PLATELETS: 150 10*3/uL (ref 150–400)
RBC: 3.51 MIL/uL — AB (ref 4.22–5.81)
RDW: 16.4 % — AB (ref 11.5–15.5)
WBC: 4 10*3/uL (ref 4.0–10.5)

## 2015-07-02 LAB — BASIC METABOLIC PANEL
ANION GAP: 7 (ref 5–15)
BUN: 17 mg/dL (ref 6–20)
CALCIUM: 8.1 mg/dL — AB (ref 8.9–10.3)
CO2: 24 mmol/L (ref 22–32)
Chloride: 107 mmol/L (ref 101–111)
Creatinine, Ser: 0.97 mg/dL (ref 0.61–1.24)
GFR calc Af Amer: >60 mL/min (ref >60)
GLUCOSE: 174 mg/dL — AB (ref 65–99)
POTASSIUM: 3.7 mmol/L (ref 3.5–5.1)
SODIUM: 138 mmol/L (ref 135–145)

## 2015-07-02 LAB — GLUCOSE, CAPILLARY
GLUCOSE-CAPILLARY: 203 mg/dL — AB (ref 65–99)
GLUCOSE-CAPILLARY: 286 mg/dL — AB (ref 65–99)
Glucose-Capillary: 222 mg/dL — ABNORMAL HIGH (ref 65–99)

## 2015-07-02 MED ORDER — RESOURCE THICKENUP CLEAR PO POWD
ORAL | Status: DC | PRN
  Filled 2015-07-02: qty 125

## 2015-07-02 MED ORDER — IPRATROPIUM-ALBUTEROL 0.5-2.5 (3) MG/3ML IN SOLN
3.0000 mL | Freq: Two times a day (BID) | RESPIRATORY_TRACT | Status: DC
  Administered 2015-07-02 – 2015-07-03 (×3): 3 mL via RESPIRATORY_TRACT
  Filled 2015-07-02 (×3): qty 3

## 2015-07-02 MED ORDER — LORAZEPAM 2 MG/ML IJ SOLN: 0.5000 mg | Freq: Four times a day (QID) | INTRAMUSCULAR | Status: DC | PRN

## 2015-07-02 NOTE — Progress Notes (Signed)
PATIENT DETAILS Name: [name] Age: 81 y.o. Sex: male Date of Birth: [birth year] Admit Date: 06/30/2015 Admitting Physician Ivor Costa, MD PCP:No primary care provider on file.  Subjective: Awake alert-private sitter at bedside claims he is somewhat smaller than the past few days.  Assessment/Plan: Principal Problem: Aspiration pneumonia/HCAP: Suspect generalized weakness more from suspected aspiration pneumonia rather than UTI. Initially started on both with vancomycin/Zosyn, since blood cultures are negative, will discontinue vancomycin on 8/25 and just maintain on Zosyn.  Await SLP evaluation. Seems improved, with some more "energy" than yesterday  Active Problems: Diabetes mellitus type 2, uncontrolled: CBGs currently stable with SSI-resume metformin on discharge.  GERD: Continue PPI  History of CAD-s/p PCI 2006: Without chest pain or shortness of breath. Continue antiplatelets.  History of urinary retention: Foley catheter currently in place, continue Flomax. Failed voiding trial last admission-Will need outpatient follow-up with urologist.  History of COPD: Lungs clear-continue with as needed bronchodilators. On chronic prednisone 25 mg daily.  Chronic diastolic CHF (congestive heart failure): Clinically compensated  Suspected mild dementia: Supportive care  Malnutrition of moderate degree:continue supplement  Generalized weakness: Multifactorial, secondary to probable ongoing aspiration pneumonitis, frequent hospitalizations and frailty.Await PT eval-given recurrent hospitalizations-suspect may need SNF.  Disposition: Remain inpatient-Suspect either SNF vs HHPT/RN on discharge-likely 8/26  Antimicrobial agents  See below  Anti-infectives    Start     Dose/Rate Route Frequency Ordered Stop   07/01/15 2200  vancomycin (VANCOCIN) IVPB 1000 mg/200 mL premix     1,000 mg 200 mL/hr over 60 Minutes Intravenous Every 24 hours 07/01/15 0054     07/01/15 0600  piperacillin-tazobactam (ZOSYN) IVPB 3.375 g     3.375 g 12.5 mL/hr over 240 Minutes Intravenous 3 times per day 07/01/15 0045     07/01/15 0045  piperacillin-tazobactam (ZOSYN) IVPB 3.375 g  Status:  Discontinued     3.375 g 100 mL/hr over 30 Minutes Intravenous 3 times per day 07/01/15 0043 07/01/15 0044   06/30/15 2100  piperacillin-tazobactam (ZOSYN) IVPB 3.375 g     3.375 g 100 mL/hr over 30 Minutes Intravenous  Once 06/30/15 2048 06/30/15 2340   06/30/15 2100  vancomycin (VANCOCIN) IVPB 1000 mg/200 mL premix     1,000 mg 200 mL/hr over 60 Minutes Intravenous  Once 06/30/15 2048 07/01/15 0049      DVT Prophylaxis: Prophylactic  Heparin   Code Status: DNR  Family Communication Sitter at bedside  Procedures: None  CONSULTS:  None  Time spent 25 minutes-Greater than 50% of this time was spent in counseling, explanation of diagnosis, planning of further management, and coordination of care.  MEDICATIONS: Scheduled Meds: . aspirin  81 mg Oral q1800  . clopidogrel  75 mg Oral Daily  . docusate sodium  100 mg Oral BID  . feeding supplement (GLUCERNA SHAKE)  237 mL Oral TID BM  . heparin  5,000 Units Subcutaneous 3 times per day  . insulin aspart  0-9 Units Subcutaneous TID WC  . ipratropium-albuterol  3 mL Nebulization TID  . lactulose  10 g Oral BID  . pantoprazole sodium  20 mg Oral Daily  . piperacillin-tazobactam (ZOSYN)  IV  3.375 g Intravenous 3 times per day  . predniSONE  25 mg Oral Q breakfast  . saccharomyces boulardii  250 mg Oral BID  . sertraline  50 mg Oral QHS  . sodium chloride  3 mL Intravenous Q12H  .  tamsulosin  0.4 mg Oral Daily  . vancomycin  1,000 mg Intravenous Q24H   Continuous Infusions:   PRN Meds:.acetaminophen **OR** acetaminophen, albuterol, bisacodyl, morphine CONCENTRATE, polyethylene glycol, tiotropium    PHYSICAL EXAM: Vital signs in last 24 hours: Filed Vitals:   07/01/15 1930 07/01/15 2116 07/02/15 0512  07/02/15 0739  BP:  120/58 119/60   Pulse: 85 86 77   Temp:  98 F (36.7 C) 98 F (36.7 C)   TempSrc:  Oral Oral   Resp: 18 18 18    Height:      Weight:   60.8 kg (134 lb 0.6 oz)   SpO2: 99% 97% 98% 96%    Weight change: -1.168 kg (-2 lb 9.2 oz) Filed Weights   07/01/15 0057 07/01/15 0704 07/02/15 0512  Weight: 58.968 kg (130 lb) 57.8 kg (127 lb 6.8 oz) 60.8 kg (134 lb 0.6 oz)   Body mass index is 22.31 kg/(m^2).   Gen Exam: Awake and alert with clear speech.   Neck: Supple, No JVD.   Chest: B/L Clear.  No rales anteriorly CVS: S1 S2 Regular, no murmurs.  Abdomen: soft, BS +, non tender, non distended.  Extremities: no edema, lower extremities warm to touch. Neurologic: Non Focal.   Skin: No Rash.   Wounds: N/A.   Intake/Output from previous day:  Intake/Output Summary (Last 24 hours) at 07/02/15 1054 Last data filed at 07/02/15 0513  Gross per 24 hour  Intake    300 ml  Output    650 ml  Net   -350 ml     LAB RESULTS: CBC  Recent Labs Lab 06/30/15 1951 07/02/15 0600  WBC 5.8 4.0  HGB 11.2* 10.4*  HCT 34.4* 32.1*  PLT 198 150  MCV 91.2 91.5  MCH 29.7 29.6  MCHC 32.6 32.4  RDW 16.1* 16.4*    Chemistries   Recent Labs Lab 06/30/15 1951 07/02/15 0600  NA 137 138  K 4.5 3.7  CL 106 107  CO2 25 24  GLUCOSE 190* 174*  BUN 20 17  CREATININE 1.06 0.97  CALCIUM 8.6* 8.1*    CBG:  Recent Labs Lab 06/30/15 2004 07/01/15 0808 07/01/15 1228 07/01/15 1704 07/01/15 2126  GLUCAP 169* 131* 143* 106* 211*    GFR Estimated Creatinine Clearance: 48.8 mL/min (by C-G formula based on Cr of 0.97).  Coagulation profile  Recent Labs Lab 07/01/15 0121  INR 1.10    Cardiac Enzymes No results for input(s): CKMB, TROPONINI, MYOGLOBIN in the last 168 hours.  Invalid input(s): CK  Invalid input(s): POCBNP No results for input(s): DDIMER in the last 72 hours. No results for input(s): HGBA1C in the last 72 hours. No results for input(s): CHOL,  HDL, LDLCALC, TRIG, CHOLHDL, LDLDIRECT in the last 72 hours. No results for input(s): TSH, T4TOTAL, T3FREE, THYROIDAB in the last 72 hours.  Invalid input(s): FREET3 No results for input(s): VITAMINB12, FOLATE, FERRITIN, TIBC, IRON, RETICCTPCT in the last 72 hours. No results for input(s): LIPASE, AMYLASE in the last 72 hours.  Urine Studies No results for input(s): UHGB, CRYS in the last 72 hours.  Invalid input(s): UACOL, UAPR, USPG, UPH, UTP, UGL, UKET, UBIL, UNIT, UROB, ULEU, UEPI, UWBC, URBC, UBAC, CAST, UCOM, BILUA  MICROBIOLOGY: Recent Results (from the past 240 hour(s))  Blood culture (routine x 2)     Status: None (Preliminary result)   Collection Time: 06/30/15  9:08 PM  Result Value Ref Range Status   Specimen Description BLOOD RIGHT ARM  Final  Special Requests BOTTLES DRAWN AEROBIC AND ANAEROBIC 10CC  Final   Culture NO GROWTH < 24 HOURS  Final   Report Status PENDING  Incomplete  Blood culture (routine x 2)     Status: None (Preliminary result)   Collection Time: 06/30/15  9:12 PM  Result Value Ref Range Status   Specimen Description BLOOD RIGHT HAND  Final   Special Requests BOTTLES DRAWN AEROBIC AND ANAEROBIC 5CC  Final   Culture NO GROWTH < 24 HOURS  Final   Report Status PENDING  Incomplete  Urine culture     Status: None (Preliminary result)   Collection Time: 07/01/15  2:09 PM  Result Value Ref Range Status   Specimen Description URINE, CATHETERIZED  Final   Special Requests NONE  Final   Culture TOO YOUNG TO READ  Final   Report Status PENDING  Incomplete    RADIOLOGY STUDIES/RESULTS: Dg Chest 2 View  07/02/2015   CLINICAL DATA:  Shortness of breath, generalized pain, weakness  EXAM: CHEST  2 VIEW  COMPARISON:  Chest x-rays dated 06/30/2015 and 06/23/2015.  FINDINGS: Mild cardiomegaly is stable. Again noted are numerous calcified lymph nodes throughout the mediastinum and perihilar regions. Suspect upper lobe emphysematous change.  Dense opacity at the  medial left lung base is unchanged in the short-term interval and grossly stable compared to earlier plain films dating back to 03/15/2015, indicating chronic atelectasis or scarring. Vague opacity at the right lung base is stable and most suggestive of a small layering pleural effusion with associated interstitial edema.  Streaky opacity at the inferior aspects of the right upper lobe is new compared to the most recent plain film examination of 06/30/2015 but similar compared to earlier chest x-ray of 06/23/2015, suggesting recurrent atelectasis, pneumonia less likely.  IMPRESSION: 1. Streaky opacity at the inferior aspects of the right upper lobe is new compared to the most recent plain film examination of 06/30/2015 but similar compared to earlier chest x-ray of 06/23/2015, suggesting recurrent atelectasis, pneumonia less likely. 2. Probable small layering right pleural effusion, with associated interstitial edema in the right lower lobe, unchanged compared to recent exams. 3. Chronic atelectasis/scarring at the left lung base. 4. Probable upper lobe emphysematous change.   Electronically Signed   By: Franki Cabot M.D.   On: 07/02/2015 07:59   Ct Abdomen Pelvis W Contrast  06/19/2015   CLINICAL DATA:  Recurring urinary tract infection. Suprapubic tenderness. Right flank pain and tenderness. Confusion over the last 2 days.  EXAM: CT ABDOMEN AND PELVIS WITH CONTRAST  TECHNIQUE: Multidetector CT imaging of the abdomen and pelvis was performed using the standard protocol following bolus administration of intravenous contrast.  CONTRAST:  187mL OMNIPAQUE IOHEXOL 300 MG/ML  SOLN  COMPARISON:  CT pelvis 05/24/2015. Abdominal radiographs 06/12/2015.  FINDINGS: Bilateral pleural effusions are present. There is associated atelectasis. A granuloma is present at the right lung base. Right lower lobe and right middle lobe bronchiectasis is evident.  The heart size is normal. Coronary artery calcifications are present. A  small pericardial effusion is noted. Calcified paraesophageal lymph nodes are present.  The liver and spleen are within normal limits. Dense calcifications are present throughout the splenic artery. The stomach, duodenum, and pancreas are within normal limits. The common bile duct is within normal limits. A 13 mm gallstone is present. There is no focal inflammation to suggest cholecystitis.  The adrenal glands are normal bilaterally. A calcified mass at the upper pole of the right kidney measures 2.0 x 1.1  cm without significant change. Multiple bilateral cysts are present in both kidneys. There is no hydronephrosis. The prostate gland is irregular and extends into the base of the urinary bladder. Cephalo caudad dimension is 5.4 cm.  Edematous changes surround the rectum. The rectum is less distended than on the prior exam. The more proximal sigmoid colon is within normal limits. The remainder the colon is unremarkable. The appendix is surgically absent. Small bowel is within normal limits. No significant adenopathy or free fluid is present.  Dense atherosclerotic calcifications are present in the abdominal aorta and branch vessels without aneurysm.  The bone windows demonstrate fusion of the SI joints bilaterally. No focal lytic or blastic lesions are present.  IMPRESSION: 1. Inflammatory changes about the rectum compatible with acute proctitis. 2. Enlarged and irregular prostate gland.  This is stable. 3. Extensive atherosclerotic changes. 4. Stable calcification in tip pole of the right kidney. 5. Progressive cystic changes in both kidneys. 6. Fusion of the SI joints. 7. Cholelithiasis without cholecystitis.   Electronically Signed   By: San Morelle M.D.   On: 06/19/2015 23:52   Dg Chest Portable 1 View  06/30/2015   CLINICAL DATA:  Pt caregiver states he has been feeling weak and lethargic x 23 weeks, was seen at [facility] last week, finished antibiotics, and seems to be getting worse with his  lethargy,weakness and sleeps often, hx copd, non smoker, chf, was being treated for pneumonia  EXAM: PORTABLE CHEST - 1 VIEW  COMPARISON:  06/23/2015  FINDINGS: There is opacity in both lung bases likely combination of small pleural effusions with either atelectasis or infiltrate. Atelectasis favored.  Lungs are hyperexpanded. There are decreased markings in the upper lobes consistent with emphysema. There are areas of stable lung scarring.  There are multiple calcified lymph nodes along the mediastinum and hila, stable. No mediastinal or hilar masses.  Heart is borderline enlarged.  No pneumothorax.  Bony thorax is demineralized but grossly intact.  IMPRESSION: 1. Bilateral pleural effusions, which have developed/day increased when compared to the prior study. 2. Associated parenchymal lung base opacity potentially due to pneumonia but more likely atelectasis. No pulmonary edema. 3. Underlying COPD. Multiple stable calcified mediastinal and hilar lymph nodes.   Electronically Signed   By: Lajean Manes M.D.   On: 06/30/2015 19:52   Dg Chest Port 1 View  06/23/2015   CLINICAL DATA:  Three-day history of cough  EXAM: PORTABLE CHEST - 1 VIEW  COMPARISON:  June 19, 2015  FINDINGS: Lungs remain hyperexpanded. There is new airspace opacity in the right mid lung consistent with pneumonia. There is also underlying scarring in this area. No other areas of airspace consolidation identified. The heart size is within normal limits. Pulmonary vascularity reflects a degree of underlying emphysematous change. There is extensive lymph node calcification diffusely, stable. There is degenerative change in the thoracic spine.  IMPRESSION: New right mid lung region pneumonia. There is a degree of underlying emphysematous change. Multiple calcified lymph nodes are noted. Patient has a history of histoplasmosis. Followup PA and lateral chest radiographs recommended in 3-4 weeks following trial of antibiotic therapy to ensure  resolution and exclude underlying malignancy.   Electronically Signed   By: Lowella Grip III M.D.   On: 06/23/2015 10:58   Dg Chest Portable 1 View  06/19/2015   CLINICAL DATA:  At sepsis.  CVD.  EXAM: PORTABLE CHEST - 1 VIEW  COMPARISON:  Acute abdominal series 06/12/2015. CT of the chest 04/23/2015.  FINDINGS: The  heart is mildly enlarged. Extensive calcified mediastinal and hilar adenopathy is again noted. The left pleural effusion and basilar airspace disease has increased. A small right pleural effusion is present. Coronary artery calcification or stents are evident.  IMPRESSION: 1. Increasing left pleural effusion and basilar airspace disease. While this may represent atelectasis, infection is also considered. 2. Stable diffuse calcified adenopathy.   Electronically Signed   By: San Morelle M.D.   On: 06/19/2015 18:48   Dg Abd Acute W/chest  06/12/2015   CLINICAL DATA:  Pt here with abd discomfort/constipation x2 weeks. Was told he has an impaction. Has had multiple enemas recently. Denies prior surgery to abd  EXAM: DG ABDOMEN ACUTE W/ 1V CHEST  COMPARISON:  05/24/2015  FINDINGS: There is no bowel dilation to suggest obstruction. On the decubitus views, there are scattered air-fluid levels. This may reflect a mild adynamic ileus. It can be seen in setting of gastroenteritis. It may be due to the multiple recent enemas as described in history.  There is no free air.  Densities project in the right upper quadrant suggesting gallstones. There are aortoiliac and branch vessel vascular calcifications. Splenic artery calcifications are noted in the left upper quadrant. Soft tissues are otherwise unremarkable.  No acute bony abnormality.  Frontal chest radiograph demonstrates numerous calcified mediastinal and hilar lymph nodes, stable. There are areas of lung scarring and emphysema. No acute findings in the lungs. No pleural effusion or pneumothorax.  IMPRESSION: 1. No evidence of bowel  obstruction or generalized adynamic ileus. No significant increased stool. 2. Nonspecific air-fluid levels within nondistended bowel on the decubitus views. This may be due to gastroenteritis or from the multiple enemas. 3. No acute cardiopulmonary disease.   Electronically Signed   By: Lajean Manes M.D.   On: 06/12/2015 17:04    Oren Binet, MD  [facility] Pager:[phone]  If 7PM-7AM, please contact night-coverage [email or web address] Password [email or web address] 07/02/2015, 10:54 AM   LOS: 2 days

## 2015-07-02 NOTE — Evaluation (Addendum)
Physical Therapy Evaluation Patient Details Name: [name] MRN: [id number] DOB: Jul 17, 1929 Today's Date: 07/02/2015   History of Present Illness  81 y.o. male admitted to [facility] on 06/30/15 for generalized weakness.   Pt found to have aspiration PNA, and HCAP.  Pt with significant PMHx of essential HTN, COPD, kidney disease, CHF, NSTEMI, DM 2, and R forearm ORIF.   Clinical Impression  Very limited bed level evaluation preformed.  Pt refuses EOB or OOB mobility due to left hip pain (proximal lateral pain over the greater trochanter) that is point tender to the touch and painful with movement of both the right leg and left leg.  Unless the patient can transfer out of bed to the chair with assist, he is not safe to go home even with 24/7 caregivers.  SNF would be his safest d/c destination.     Follow Up Recommendations SNF    Equipment Recommendations  Other (comment) (hospital bed and hoyer lift)    Recommendations for Other Services   NA    Precautions / Restrictions Precautions Precautions: Fall      Mobility  Bed Mobility               General bed mobility comments: attempted mobility to EOB, but pt refused after starting to move legs over and having pain.   Transfers                 General transfer comment: pt refused due to left hip pain.   Ambulation/Gait             General Gait Details: it sounds as though he has not walked in a while.  The aide from home who was with him in the room states she has not been with him long enough to know how long it has been since he walked.         Exercises:    07/02/15 1636  Other Exercises  Other Exercises AAROM to bil legs ankle DF, knee/hip flexion, hip abduction bil         Pertinent Vitals/Pain Pain Assessment: Faces Faces Pain Scale: Hurts worst Pain Location: left proximal lateral thigh near the greater trochanter, tender to touch and tender during ROM of BOTH sides.  Pain Intervention(s):  Limited activity within patient's tolerance;Monitored during session;Repositioned    Home Living Family/patient expects to be discharged to:: Private residence Living Arrangements: Non-relatives/Friends Available Help at Discharge: Friend(s);Personal care attendant;Available 24 hours/day Type of Home: Other(Comment) (condominium) Home Access: Level entry     Home Layout: One level Home Equipment: Walker - 2 wheels;Cane - single point;Bedside commode;Wheelchair - manual Additional Comments: per caregiveer, patient at home with 24 hour caregivers.    Prior Function Level of Independence: Needs assistance   Gait / Transfers Assistance Needed: per caregiver pivots to the [facility].            Hand Dominance   Dominant Hand: Right    Extremity/Trunk Assessment   Upper Extremity Assessment: Defer to OT evaluation           Lower Extremity Assessment: LLE deficits/detail;RLE deficits/detail RLE Deficits / Details: right leg movement causes pain in left lateral hip.  ankle 3/5, knee 2+/5, hip 2+/5 flexion LLE Deficits / Details: left leg significantly limited due to lateral hip pain.  ankle DF 3/5, hip and knee flexion 2/5. Hip abduction 2/5     Communication   Communication: No difficulties  Cognition Arousal/Alertness: Awake/alert Behavior During Therapy: Agitated;Anxious Overall Cognitive Status:  History of cognitive impairments - at baseline                               Assessment/Plan    PT Assessment Patient needs continued PT services  PT Diagnosis Difficulty walking;Abnormality of gait;Generalized weakness;Acute pain   PT Problem List Decreased strength;Decreased range of motion;Decreased activity tolerance;Decreased balance;Decreased mobility;Decreased knowledge of use of DME;Decreased knowledge of precautions;Pain  PT Treatment Interventions DME instruction;Gait training;Functional mobility training;Therapeutic activities;Therapeutic  exercise;Neuromuscular re-education;Balance training;Patient/family education;Modalities;Manual techniques   PT Goals (Current goals can be found in the Care Plan section) Acute Rehab PT Goals Patient Stated Goal: none stated PT Goal Formulation: Patient unable to participate in goal setting Time For Goal Achievement: 07/16/15 Potential to Achieve Goals: Fair    Frequency Min 2X/week    End of Session   Activity Tolerance: Patient limited by pain Patient left: in bed;with call bell/phone within reach;with nursing/sitter in room Nurse Communication: Mobility status         Time: 2353-6144 PT Time Calculation (min) (ACUTE ONLY): 40 min   Charges:   PT Evaluation $Initial PT Evaluation Tier I: 1 Procedure PT Treatments $Therapeutic Exercise: 8-22 mins $Therapeutic Activity: 8-22 mins        Nohelani Benning B. Mine [place], [place], DPT [phone]   07/02/2015, 4:47 PM
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
## 2015-07-03 DIAGNOSIS — J411 Mucopurulent chronic bronchitis: Secondary | ICD-10-CM

## 2015-07-03 LAB — GLUCOSE, CAPILLARY
GLUCOSE-CAPILLARY: 217 mg/dL — AB (ref 65–99)
Glucose-Capillary: 245 mg/dL — ABNORMAL HIGH (ref 65–99)
Glucose-Capillary: 147 mg/dL — ABNORMAL HIGH (ref 65–99)

## 2015-07-03 LAB — URINE CULTURE: Culture: >=100000

## 2015-07-03 MED ORDER — MORPHINE SULFATE (CONCENTRATE) 10 MG/0.5ML PO SOLN: 5.0000 mg | ORAL | Status: DC | PRN

## 2015-07-03 MED ORDER — LACTULOSE 10 GM/15ML PO SOLN: 10.0000 g | Freq: Two times a day (BID) | ORAL | Status: DC

## 2015-07-03 MED ORDER — ALBUTEROL SULFATE (2.5 MG/3ML) 0.083% IN NEBU: 2.5000 mg | INHALATION_SOLUTION | RESPIRATORY_TRACT | Status: DC | PRN

## 2015-07-03 MED ORDER — AMOXICILLIN-POT CLAVULANATE 875-125 MG PO TABS: 1.0000 | ORAL_TABLET | Freq: Two times a day (BID) | ORAL | Status: DC

## 2015-07-03 MED ORDER — TIOTROPIUM BROMIDE MONOHYDRATE 18 MCG IN CAPS: 18.0000 ug | ORAL_CAPSULE | Freq: Every day | RESPIRATORY_TRACT | Status: AC

## 2015-07-03 MED ORDER — INSULIN ASPART 100 UNIT/ML ~~LOC~~ SOLN: 0.0000 [IU] | Freq: Three times a day (TID) | SUBCUTANEOUS | Status: DC

## 2015-07-03 NOTE — Progress Notes (Addendum)
Spoke with [employer] at [facility]. Patient receives [facility] RN and PT through [facility]. Will need HH order, DC summary, and H&P faxed to [phone] if [facility] services are to resume.  All papers faxed 15:10 07/03/15. Sheryl notified patient will be returning to home, [facility] services will begin Saturday or Sunday.

## 2015-07-03 NOTE — Progress Notes (Signed)
Updated bedside RN Noreene Larsson on plan for discharge. POA will be home at 6:30 to receive patient from ambulance transport. RN to call and give discharge instructions over the phone. CSW notified patient will be discharged tohome with home health and ambulance transport. Dr Sloan Leiter notified patient's POA chose to take home and not to SNF.

## 2015-07-03 NOTE — Clinical Social Work Note (Signed)
HPOA [name] states she will be taking the patient home as her preferred facility is not an option for the patient. RNCM has resumed patient's home services. Anderson [facility] requests transport be set up for 6:30PM. Per MD patient ready for DC. RN and patient/family notified of DC home. Address confirmed by patient/family. DC packet on chart. Ambulance transport will be requested by patient's nurse as CSW will not be at hospital to request after hours transport. RN provided with transport number [phone]). Per Jennifer's request, SNF list with hilighted bed offers emailed to her at [email or web address]. CSW signing off at this time.    Liz Beach MSW, [place], [place], [phone]

## 2015-07-03 NOTE — Progress Notes (Signed)
PIV removed 16:00-slight skin tear. Gauze applied.  Instructions given to home care worker who will to give all paperwork to [place].Pt signed disch instr. Home care worker signed also.

## 2015-07-03 NOTE — Clinical Social Work Placement (Signed)
   CLINICAL SOCIAL WORK PLACEMENT  NOTE  Date:  07/03/2015  Patient Details  Name: [name] MRN: [id number] Date of Birth: 1929-01-24  Clinical Social Work is seeking post-discharge placement for this patient at the [place] level of care (*CSW will initial, date and re-position this form in  chart as items are completed):  Yes   Patient/family provided with [facility] Department's list of facilities offering this level of care within the geographic area requested by the patient (or if unable, by the patient's family).  Yes   Patient/family informed of their freedom to choose among providers that offer the needed level of care, that participate in Medicare, Medicaid or managed care program needed by the patient, have an available bed and are willing to accept the patient.  Yes   Patient/family informed of [facility]'s ownership interest in [facility] and [facility], as well as of the fact that they are under no obligation to receive care at these facilities.  PASRR submitted to EDS on       PASRR number received on       Existing PASRR number confirmed on 07/03/15     FL2 transmitted to all facilities in geographic area requested by pt/family on 07/03/15     FL2 transmitted to all facilities within larger geographic area on       Patient informed that his/her managed care company has contracts with or will negotiate with certain facilities, including the following:            Patient/family informed of bed offers received.  Patient chooses bed at       Physician recommends and patient chooses bed at      Patient to be transferred to   on  .  Patient to be transferred to facility by       Patient family notified on   of transfer.  Name of family member notified:        PHYSICIAN       Additional Comment:    _______________________________________________ Liz Beach MSW, [place], [place], [phone]

## 2015-07-03 NOTE — Progress Notes (Signed)
PT Cancellation Note  Patient Details Name: DALONTE HARDAGE MRN: [id number] DOB: 28-Mar-1929   Cancelled Treatment:    Reason Eval/Treat Not Completed: Patient declined, no reason specified.  Pt refusing to even let PT help with ROM of legs due to perceived pain (he yells out in pain without therapist touching him yet).  RN made aware and asked for pain medication. Thanks,   Barbarann Ehlers. [place], [place], DPT [phone]   07/03/2015, 12:53 PM
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
## 2015-07-03 NOTE — Progress Notes (Signed)
Inpatient Diabetes Program Recommendations  AACE/ADA: New Consensus Statement on Inpatient Glycemic Control (2013)  Target Ranges:  Prepandial:   less than 140 mg/dL      Peak postprandial:   less than 180 mg/dL (1-2 hours)      Critically ill patients:  140 - 180 mg/dL   Results for CAMARION, WEIER (MRN [id number]) as of 07/03/2015 07:58  Ref. Range 07/02/2015 08:38 07/02/2015 11:54 07/02/2015 16:40 07/02/2015 21:12  Glucose-Capillary Latest Ref Range: 65-99 mg/dL 203 (H) 222 (H) 286 (H) 245 (H)   Reason for Admission: Aspiration PNA  Diabetes history: DM 2 Outpatient Diabetes medications: Metformin 250 mg BID Current orders for Inpatient glycemic control: Novolog Sensitive TID  Inpatient Diabetes Program Recommendations Correction (SSI): Glucose in the 200's. Patient on chronic dose of prednisone 25 mg. Please increase correction to Moderate or Resistant scale.  Note: Patient may need increase in home Metformin due to chronic steroid use.  Thanks,  Tama Headings RN, MSN, [facility] Inpatient Diabetes Coordinator Team Pager [phone] (8am-5pm)

## 2015-07-03 NOTE — Progress Notes (Signed)
Speech Language Pathology Treatment: Dysphagia  Patient Details Name: [name] MRN: [id number] DOB: 03/24/1929 Today's Date: 07/03/2015 Time: 1011-1021 SLP Time Calculation (min) (ACUTE ONLY): 10 min  Assessment / Plan / Recommendation Clinical Impression  Goal of treatment session was skilled observation of PO intake; however, upon SLP waking patient he declined today.  Private care aide reports that he declined breakfast due to being sleepy and wanting to rest.  SLP educated care aide (different from yesterday's session) on importance of thickening all liquids to a nectar-thick consistency, consistent use of chin tuck with all POs, importance of PO when awake and alert, oral care, and that patient consume ice chips between meals due to decreased functional reserve.  SLP will continue to follow acutely for toleration versus need for repeat objective assessment.  Patient will also require follow-up service upon discharge.   HPI Other Pertinent Information: 81 y.o. male with PMH of hypertension, hyperlipidemia, diabetes mellitus, GERD, depression, COPD, diastolic congestive heart failure history of prostate cancer, skin cancer, arthritis, who presents with generalized weakness, dx of UTI.  Pt has had three hospital admissions last six months.  He has had clinical swallowing evaluations during both prior admissions. MBS 04/24/15 found moderate pharyngeal phase dysphagia and a suspected primary esophageal dysphagia.  (Poor esophageal clearance).  Pt has had documented aspiration, helped by use of a chin tuck and behavioral strategies, however, he continues to demonstrate signs of recurring aspiration despite efforts to protect airway.     Pertinent Vitals Pain Assessment: No/denies pain  SLP Plan  Continue with current plan of care    Recommendations Diet recommendations: Dysphagia 3 (mechanical soft);Nectar-thick liquid Liquids provided via: Cup;Straw Medication Administration: Crushed with  puree Supervision: Full supervision/cueing for compensatory strategies Compensations: Slow rate;Clear throat intermittently;Use straw to facilitate chin tuck;Chin tuck;Small sips/bites Postural Changes and/or Swallow Maneuvers: Seated upright 90 degrees;Upright 30-60 min after meal              Oral Care Recommendations: Oral care BID Follow up Recommendations: Skilled Nursing facility;24 hour supervision/assistance Plan: Continue with current plan of care    GO    Carmelia Roller., [place]  [place] 07/03/2015, 10:26 AM

## 2015-07-03 NOTE — Clinical Social Work Note (Addendum)
Clinical Social Work Assessment  Patient Details  Name: [name] MRN: [id number] Date of Birth: Mar 25, 1929  Date of referral:  07/02/2015           Reason for consult:  Discharge Planning, Facility Placement                Permission sought to share information with:  Customer service manager, Other Permission granted to share information::  Yes, Verbal Permission Granted  Name::     [name]  Agency::  SNFs  Relationship::     Contact Information:     Housing/Transportation Living arrangements for the past 2 months:  [place] of Information:  Patient, Other (Comment Required) [name] patient friend.) Patient Interpreter Needed:  None Criminal Activity/Legal Involvement Pertinent to Current Situation/Hospitalization:  No - Comment as needed Significant Relationships:  Friend Lives with:  Friends Do you feel safe going back to the place where you live?  Yes Need for family participation in patient care:  Yes (Comment)  Care giving concerns:  Patient's friend [name] states she thinks the patient will benefit from short term SNF placement. She will discuss with the patient.   Social Worker assessment / plan:  CSW met with patient and caregiver at bedside to complete assessment. Patient states, "you need to talk to the boss." Per the caregiver the "boss" is [name], she is the patient's friend and he lives with her. Per Anderson Malta the patient has 24 hour caregivers at her home but she thinks the patient could benefit from short term rehab placement. Anderson Malta states that she would like the patient to go to [facility] SN[facility] at discharge if they can offer a bed. Anderson Malta states that she will talk to the patient about this and she suspects he will be agreeable. CSW explained SNF search/placement process and answered her questions. CSW will followup with bed offers.  Employment status:  Disabled (Comment on whether or not currently  receiving Disability) Insurance information:  Medicare PT Recommendations:  [place] / Referral to community resources:  [place]  Patient/Family's Response to care:  Anderson Malta has a lot of questions for the doctor. CSW has notified MD. She states she has been very happy with the care the patient has received and the "thoroughness" of the physician.   Patient/Family's Understanding of and Emotional Response to Diagnosis, Current Treatment, and Prognosis:  The patient appears to have fair insight into reason for admission. Anderson Malta has clear understanding of reason for admission and appears to understand what the patient's post DC needs will be.   Emotional Assessment Appearance:  Appears stated age Attitude/Demeanor/Rapport:  Guarded Affect (typically observed):  Constricted, Guarded, Other (Patient didn't really want to talk to [place].) Orientation:  Oriented to Self, Oriented to Place, Oriented to  Time, Oriented to Situation Alcohol / Substance use:  Alcohol Use Psych involvement (Current and /or in the community):  No (Comment)  Discharge Needs  Concerns to be addressed:  Discharge Planning Concerns Readmission within the last 30 days:  Yes Current discharge risk:  Physical Impairment, Chronically ill Barriers to Discharge:  Continued Medical Work up   [employer] MSW, [place], [place], [phone]

## 2015-07-03 NOTE — Care Management Important Message (Signed)
Important Message  Patient Details  Name: BRANDON WIECHMAN MRN: [id number] Date of Birth: 1929-10-19   Medicare Important Message Given:  Yes-second notification given    Carles Collet, RN 07/03/2015, 10:50 AMImportant Message  Patient Details  Name: AYDRIEN FROMAN MRN: [id number] Date of Birth: 08-20-1929   Medicare Important Message Given:  Yes-second notification given    Carles Collet, RN 07/03/2015, 10:50 AM

## 2015-07-03 NOTE — Care Management Note (Signed)
Case Management Note  Patient Details  Name: [name] MRN: [id number] Date of Birth: [birth year]  Subjective/Objective:                    Action/Plan:  DC to SNF. [place].  Expected Discharge Date:                  Expected Discharge Plan:  Skilled Nursing Facility  In-House Referral:  Clinical Social Work  Discharge planning Services  CM Consult  Post Acute Care Choice:    Choice offered to:  [facility] POA / Guardian  DME Arranged:    DME Agency:     HH Arranged:    [place] Agency:     Status of Service:  Completed, signed off  Medicare Important Message Given:    Date Medicare IM Given:    Medicare IM give by:    Date Additional Medicare IM Given:    Additional Medicare Important Message give by:     If discussed at [place] of Stay Meetings, dates discussed:    Additional Comments:  Carles Collet, RN 07/03/2015, 10:49 AM

## 2015-07-03 NOTE — Progress Notes (Signed)
Ambulance transporting pt to home. Pt has no complaints. No distress noted. Home care giver in room at bedside.

## 2015-07-03 NOTE — Progress Notes (Signed)
17:16 Ambulance notified [phone]. Pt ready to return home after 18:30. POA will meet at pt home [id number].

## 2015-07-04 LAB — GLUCOSE, CAPILLARY: Glucose-Capillary: 202 mg/dL — ABNORMAL HIGH (ref 65–99)

## 2015-07-05 LAB — CULTURE, BLOOD (ROUTINE X 2)
CULTURE: NO GROWTH
CULTURE: NO GROWTH

## 2015-07-16 ENCOUNTER — Encounter (HOSPITAL_COMMUNITY): Payer: Self-pay | Admitting: Emergency Medicine

## 2015-07-16 ENCOUNTER — Emergency Department (HOSPITAL_COMMUNITY): Payer: Medicare Other

## 2015-07-16 ENCOUNTER — Inpatient Hospital Stay (HOSPITAL_COMMUNITY)
Admission: EM | Admit: 2015-07-16 | Discharge: 2015-07-20 | DRG: 871 | Disposition: A | Discharge status: Skilled Nursing Facility | Payer: Medicare Other | Attending: Internal Medicine | Admitting: Internal Medicine

## 2015-07-16 DIAGNOSIS — R0602 Shortness of breath: Secondary | ICD-10-CM | POA: Diagnosis not present

## 2015-07-16 DIAGNOSIS — Z7952 Long term (current) use of systemic steroids: Secondary | ICD-10-CM

## 2015-07-16 DIAGNOSIS — IMO0002 Reserved for concepts with insufficient information to code with codable children: Secondary | ICD-10-CM | POA: Diagnosis present

## 2015-07-16 DIAGNOSIS — Z955 Presence of coronary angioplasty implant and graft: Secondary | ICD-10-CM

## 2015-07-16 DIAGNOSIS — I5032 Chronic diastolic (congestive) heart failure: Secondary | ICD-10-CM | POA: Diagnosis present

## 2015-07-16 DIAGNOSIS — Z66 Do not resuscitate: Secondary | ICD-10-CM | POA: Diagnosis present

## 2015-07-16 DIAGNOSIS — I252 Old myocardial infarction: Secondary | ICD-10-CM

## 2015-07-16 DIAGNOSIS — Z7982 Long term (current) use of aspirin: Secondary | ICD-10-CM

## 2015-07-16 DIAGNOSIS — I959 Hypotension, unspecified: Secondary | ICD-10-CM | POA: Diagnosis present

## 2015-07-16 DIAGNOSIS — I10 Essential (primary) hypertension: Secondary | ICD-10-CM | POA: Diagnosis present

## 2015-07-16 DIAGNOSIS — J690 Pneumonitis due to inhalation of food and vomit: Secondary | ICD-10-CM | POA: Diagnosis present

## 2015-07-16 DIAGNOSIS — A419 Sepsis, unspecified organism: Secondary | ICD-10-CM | POA: Diagnosis not present

## 2015-07-16 DIAGNOSIS — E785 Hyperlipidemia, unspecified: Secondary | ICD-10-CM | POA: Diagnosis present

## 2015-07-16 DIAGNOSIS — Z515 Encounter for palliative care: Secondary | ICD-10-CM | POA: Insufficient documentation

## 2015-07-16 DIAGNOSIS — I2510 Atherosclerotic heart disease of native coronary artery without angina pectoris: Secondary | ICD-10-CM | POA: Diagnosis present

## 2015-07-16 DIAGNOSIS — K219 Gastro-esophageal reflux disease without esophagitis: Secondary | ICD-10-CM | POA: Diagnosis present

## 2015-07-16 DIAGNOSIS — Z7902 Long term (current) use of antithrombotics/antiplatelets: Secondary | ICD-10-CM

## 2015-07-16 DIAGNOSIS — J449 Chronic obstructive pulmonary disease, unspecified: Secondary | ICD-10-CM | POA: Diagnosis present

## 2015-07-16 DIAGNOSIS — Z8546 Personal history of malignant neoplasm of prostate: Secondary | ICD-10-CM

## 2015-07-16 DIAGNOSIS — J9601 Acute respiratory failure with hypoxia: Secondary | ICD-10-CM | POA: Diagnosis present

## 2015-07-16 DIAGNOSIS — Z85828 Personal history of other malignant neoplasm of skin: Secondary | ICD-10-CM

## 2015-07-16 DIAGNOSIS — J189 Pneumonia, unspecified organism: Secondary | ICD-10-CM | POA: Diagnosis present

## 2015-07-16 DIAGNOSIS — E860 Dehydration: Secondary | ICD-10-CM | POA: Diagnosis present

## 2015-07-16 DIAGNOSIS — I471 Supraventricular tachycardia: Secondary | ICD-10-CM | POA: Diagnosis present

## 2015-07-16 DIAGNOSIS — E1165 Type 2 diabetes mellitus with hyperglycemia: Secondary | ICD-10-CM | POA: Diagnosis present

## 2015-07-16 MED ORDER — IPRATROPIUM BROMIDE 0.02 % IN SOLN
1.0000 mg | Freq: Once | RESPIRATORY_TRACT | Status: AC
  Administered 2015-07-17: 1 mg via RESPIRATORY_TRACT
  Filled 2015-07-16: qty 5

## 2015-07-16 MED ORDER — ALBUTEROL (5 MG/ML) CONTINUOUS INHALATION SOLN
10.0000 mg/h | INHALATION_SOLUTION | RESPIRATORY_TRACT | Status: DC
  Administered 2015-07-17: 10 mg/h via RESPIRATORY_TRACT
  Filled 2015-07-16: qty 20

## 2015-07-16 MED ORDER — PIPERACILLIN-TAZOBACTAM 3.375 G IVPB 30 MIN
3.3750 g | Freq: Once | INTRAVENOUS | Status: AC
  Administered 2015-07-16: 3.375 g via INTRAVENOUS
  Filled 2015-07-16: qty 50

## 2015-07-16 MED ORDER — VANCOMYCIN HCL IN DEXTROSE 1-5 GM/200ML-% IV SOLN
1000.0000 mg | Freq: Once | INTRAVENOUS | Status: AC
  Administered 2015-07-17: 1000 mg via INTRAVENOUS
  Filled 2015-07-16: qty 200

## 2015-07-16 NOTE — ED Notes (Signed)
Pt in EMS from [facility] home facility, SOB past 2 hr, hx COPD, CHF. Facility gave 1 breathing tx. sats 88% on 5L. EMS placed on NRB sats up to 96%.
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
## 2015-07-16 NOTE — ED Notes (Signed)
Called resp, en route to admin continuous neb

## 2015-07-17 ENCOUNTER — Inpatient Hospital Stay (HOSPITAL_COMMUNITY): Payer: Medicare Other

## 2015-07-17 DIAGNOSIS — Z515 Encounter for palliative care: Secondary | ICD-10-CM | POA: Insufficient documentation

## 2015-07-17 DIAGNOSIS — J449 Chronic obstructive pulmonary disease, unspecified: Secondary | ICD-10-CM | POA: Diagnosis present

## 2015-07-17 DIAGNOSIS — A419 Sepsis, unspecified organism: Principal | ICD-10-CM

## 2015-07-17 DIAGNOSIS — I5032 Chronic diastolic (congestive) heart failure: Secondary | ICD-10-CM | POA: Diagnosis present

## 2015-07-17 DIAGNOSIS — Z85828 Personal history of other malignant neoplasm of skin: Secondary | ICD-10-CM | POA: Diagnosis not present

## 2015-07-17 DIAGNOSIS — J690 Pneumonitis due to inhalation of food and vomit: Secondary | ICD-10-CM | POA: Diagnosis present

## 2015-07-17 DIAGNOSIS — Z66 Do not resuscitate: Secondary | ICD-10-CM | POA: Diagnosis present

## 2015-07-17 DIAGNOSIS — Z7902 Long term (current) use of antithrombotics/antiplatelets: Secondary | ICD-10-CM | POA: Diagnosis not present

## 2015-07-17 DIAGNOSIS — Z8546 Personal history of malignant neoplasm of prostate: Secondary | ICD-10-CM | POA: Diagnosis not present

## 2015-07-17 DIAGNOSIS — E860 Dehydration: Secondary | ICD-10-CM | POA: Diagnosis present

## 2015-07-17 DIAGNOSIS — J189 Pneumonia, unspecified organism: Secondary | ICD-10-CM

## 2015-07-17 DIAGNOSIS — J411 Mucopurulent chronic bronchitis: Secondary | ICD-10-CM | POA: Diagnosis not present

## 2015-07-17 DIAGNOSIS — I10 Essential (primary) hypertension: Secondary | ICD-10-CM | POA: Diagnosis present

## 2015-07-17 DIAGNOSIS — I252 Old myocardial infarction: Secondary | ICD-10-CM | POA: Diagnosis not present

## 2015-07-17 DIAGNOSIS — J438 Other emphysema: Secondary | ICD-10-CM | POA: Diagnosis not present

## 2015-07-17 DIAGNOSIS — I471 Supraventricular tachycardia: Secondary | ICD-10-CM | POA: Diagnosis not present

## 2015-07-17 DIAGNOSIS — I959 Hypotension, unspecified: Secondary | ICD-10-CM | POA: Diagnosis present

## 2015-07-17 DIAGNOSIS — R0602 Shortness of breath: Secondary | ICD-10-CM | POA: Diagnosis present

## 2015-07-17 DIAGNOSIS — Z7982 Long term (current) use of aspirin: Secondary | ICD-10-CM | POA: Diagnosis not present

## 2015-07-17 DIAGNOSIS — E1165 Type 2 diabetes mellitus with hyperglycemia: Secondary | ICD-10-CM | POA: Diagnosis not present

## 2015-07-17 DIAGNOSIS — J9601 Acute respiratory failure with hypoxia: Secondary | ICD-10-CM

## 2015-07-17 DIAGNOSIS — I9589 Other hypotension: Secondary | ICD-10-CM

## 2015-07-17 DIAGNOSIS — Z955 Presence of coronary angioplasty implant and graft: Secondary | ICD-10-CM | POA: Diagnosis not present

## 2015-07-17 DIAGNOSIS — Z7952 Long term (current) use of systemic steroids: Secondary | ICD-10-CM | POA: Diagnosis not present

## 2015-07-17 DIAGNOSIS — E785 Hyperlipidemia, unspecified: Secondary | ICD-10-CM | POA: Diagnosis present

## 2015-07-17 DIAGNOSIS — I2510 Atherosclerotic heart disease of native coronary artery without angina pectoris: Secondary | ICD-10-CM | POA: Diagnosis present

## 2015-07-17 DIAGNOSIS — K219 Gastro-esophageal reflux disease without esophagitis: Secondary | ICD-10-CM | POA: Diagnosis present

## 2015-07-17 LAB — URINE MICROSCOPIC-ADD ON

## 2015-07-17 LAB — COMPREHENSIVE METABOLIC PANEL
ALK PHOS: 51 U/L (ref 38–126)
ALT: 8 U/L — ABNORMAL LOW (ref 17–63)
AST: 18 U/L (ref 15–41)
Albumin: 2.7 g/dL — ABNORMAL LOW (ref 3.5–5.0)
Anion gap: 11 (ref 5–15)
BILIRUBIN TOTAL: 1 mg/dL (ref 0.3–1.2)
BUN: 27 mg/dL — AB (ref 6–20)
CALCIUM: 8.6 mg/dL — AB (ref 8.9–10.3)
CO2: 19 mmol/L — ABNORMAL LOW (ref 22–32)
Chloride: 109 mmol/L (ref 101–111)
Creatinine, Ser: 1.08 mg/dL (ref 0.61–1.24)
GFR calc Af Amer: >60 mL/min (ref >60)
GLUCOSE: 170 mg/dL — AB (ref 65–99)
POTASSIUM: 4 mmol/L (ref 3.5–5.1)
Sodium: 139 mmol/L (ref 135–145)
TOTAL PROTEIN: 5.3 g/dL — AB (ref 6.5–8.1)

## 2015-07-17 LAB — CBC WITH DIFFERENTIAL/PLATELET
Basophils Absolute: 0 K/uL (ref 0.0–0.1)
Basophils Relative: 0 % (ref 0–1)
Eosinophils Absolute: 0 K/uL (ref 0.0–0.7)
Eosinophils Relative: 0 % (ref 0–5)
HCT: 37.1 % — ABNORMAL LOW (ref 39.0–52.0)
Hemoglobin: 11.9 g/dL — ABNORMAL LOW (ref 13.0–17.0)
Lymphocytes Relative: 4 % — ABNORMAL LOW (ref 12–46)
Lymphs Abs: 0.5 K/uL — ABNORMAL LOW (ref 0.7–4.0)
MCH: 28.7 pg (ref 26.0–34.0)
MCHC: 32.1 g/dL (ref 30.0–36.0)
MCV: 89.6 fL (ref 78.0–100.0)
Monocytes Absolute: 0.5 K/uL (ref 0.1–1.0)
Monocytes Relative: 4 % (ref 3–12)
Neutro Abs: 11.9 K/uL — ABNORMAL HIGH (ref 1.7–7.7)
Neutrophils Relative %: 92 % — ABNORMAL HIGH (ref 43–77)
Platelets: 200 K/uL (ref 150–400)
RBC: 4.14 MIL/uL — ABNORMAL LOW (ref 4.22–5.81)
RDW: 15.5 % (ref 11.5–15.5)
WBC: 12.9 K/uL — ABNORMAL HIGH (ref 4.0–10.5)

## 2015-07-17 LAB — URINALYSIS, ROUTINE W REFLEX MICROSCOPIC
Glucose, UA: NEGATIVE mg/dL
Ketones, ur: NEGATIVE mg/dL
NITRITE: NEGATIVE
Protein, ur: 100 mg/dL — AB
Specific Gravity, Urine: 1.023 (ref 1.005–1.030)
UROBILINOGEN UA: 1 mg/dL (ref 0.0–1.0)
pH: 7.5 (ref 5.0–8.0)

## 2015-07-17 LAB — I-STAT CG4 LACTIC ACID, ED: Lactic Acid, Venous: 3.27 mmol/L (ref 0.5–2.0)

## 2015-07-17 LAB — PROTIME-INR
INR: 1.15 (ref 0.00–1.49)
Prothrombin Time: 14.9 seconds (ref 11.6–15.2)

## 2015-07-17 LAB — GLUCOSE, CAPILLARY
GLUCOSE-CAPILLARY: 129 mg/dL — AB (ref 65–99)
Glucose-Capillary: 319 mg/dL — ABNORMAL HIGH (ref 65–99)
Glucose-Capillary: 236 mg/dL — ABNORMAL HIGH (ref 65–99)
Glucose-Capillary: 132 mg/dL — ABNORMAL HIGH (ref 65–99)

## 2015-07-17 LAB — LIPASE, BLOOD: LIPASE: 23 U/L (ref 22–51)

## 2015-07-17 LAB — MRSA PCR SCREENING: MRSA by PCR: NEGATIVE

## 2015-07-17 LAB — I-STAT TROPONIN, ED: Troponin i, poc: 0.02 ng/mL (ref 0.00–0.08)

## 2015-07-17 LAB — TSH: TSH: 2.985 u[IU]/mL (ref 0.350–4.500)

## 2015-07-17 LAB — STREP PNEUMONIAE URINARY ANTIGEN: Strep Pneumo Urinary Antigen: NEGATIVE

## 2015-07-17 MED ORDER — PREDNISONE 50 MG PO TABS: 25.0000 mg | ORAL_TABLET | Freq: Every day | ORAL | Status: DC

## 2015-07-17 MED ORDER — ADENOSINE 6 MG/2ML IV SOLN
12.0000 mg | Freq: Once | INTRAVENOUS | Status: AC
  Administered 2015-07-17: 12 mg via INTRAVENOUS

## 2015-07-17 MED ORDER — DOCUSATE SODIUM 100 MG PO CAPS
100.0000 mg | ORAL_CAPSULE | Freq: Two times a day (BID) | ORAL | Status: DC
  Administered 2015-07-17 – 2015-07-20 (×3): 100 mg via ORAL
  Filled 2015-07-17 (×5): qty 1

## 2015-07-17 MED ORDER — SODIUM CHLORIDE 0.9 % IV BOLUS (SEPSIS)
1000.0000 mL | Freq: Once | INTRAVENOUS | Status: AC
  Administered 2015-07-17: 1000 mL via INTRAVENOUS

## 2015-07-17 MED ORDER — CLOPIDOGREL BISULFATE 75 MG PO TABS
75.0000 mg | ORAL_TABLET | Freq: Every day | ORAL | Status: DC
  Administered 2015-07-18 – 2015-07-20 (×3): 75 mg via ORAL
  Filled 2015-07-17 (×3): qty 1

## 2015-07-17 MED ORDER — INSULIN ASPART 100 UNIT/ML ~~LOC~~ SOLN
0.0000 [IU] | SUBCUTANEOUS | Status: DC
  Administered 2015-07-17: 5 [IU] via SUBCUTANEOUS
  Administered 2015-07-18: 3 [IU] via SUBCUTANEOUS
  Administered 2015-07-18: 5 [IU] via SUBCUTANEOUS
  Administered 2015-07-18 – 2015-07-19 (×3): 3 [IU] via SUBCUTANEOUS
  Administered 2015-07-19: 2 [IU] via SUBCUTANEOUS
  Administered 2015-07-19: 8 [IU] via SUBCUTANEOUS

## 2015-07-17 MED ORDER — LACTULOSE 10 GM/15ML PO SOLN
10.0000 g | Freq: Two times a day (BID) | ORAL | Status: DC
  Administered 2015-07-17 – 2015-07-20 (×4): 10 g via ORAL
  Filled 2015-07-17 (×5): qty 15

## 2015-07-17 MED ORDER — PIPERACILLIN-TAZOBACTAM 3.375 G IVPB 30 MIN: 3.3750 g | Freq: Three times a day (TID) | INTRAVENOUS | Status: DC

## 2015-07-17 MED ORDER — BISACODYL 10 MG RE SUPP: 10.0000 mg | RECTAL | Status: DC | PRN

## 2015-07-17 MED ORDER — ASPIRIN 81 MG PO CHEW
81.0000 mg | CHEWABLE_TABLET | Freq: Every day | ORAL | Status: DC
  Administered 2015-07-17 – 2015-07-19 (×3): 81 mg via ORAL
  Filled 2015-07-17 (×3): qty 1

## 2015-07-17 MED ORDER — ACETAMINOPHEN 500 MG PO TABS
500.0000 mg | ORAL_TABLET | Freq: Four times a day (QID) | ORAL | Status: DC | PRN
  Administered 2015-07-20: 500 mg via ORAL
  Filled 2015-07-17: qty 1

## 2015-07-17 MED ORDER — ADENOSINE 6 MG/2ML IV SOLN
6.0000 mg | Freq: Once | INTRAVENOUS | Status: AC
  Administered 2015-07-17: 6 mg via INTRAVENOUS
  Filled 2015-07-17: qty 2

## 2015-07-17 MED ORDER — PIPERACILLIN-TAZOBACTAM 3.375 G IVPB
3.3750 g | Freq: Three times a day (TID) | INTRAVENOUS | Status: DC
  Administered 2015-07-17 – 2015-07-20 (×10): 3.375 g via INTRAVENOUS
  Filled 2015-07-17 (×12): qty 50

## 2015-07-17 MED ORDER — HEPARIN SODIUM (PORCINE) 5000 UNIT/ML IJ SOLN
5000.0000 [IU] | Freq: Three times a day (TID) | INTRAMUSCULAR | Status: DC
  Administered 2015-07-17 – 2015-07-20 (×11): 5000 [IU] via SUBCUTANEOUS
  Filled 2015-07-17 (×11): qty 1

## 2015-07-17 MED ORDER — PREDNISONE 5 MG PO TABS: 5.0000 mg | ORAL_TABLET | Freq: Every day | ORAL | Status: DC

## 2015-07-17 MED ORDER — ADENOSINE 6 MG/2ML IV SOLN
INTRAVENOUS | Status: AC
  Administered 2015-07-17: 12 mg via INTRAVENOUS
  Filled 2015-07-17: qty 4

## 2015-07-17 MED ORDER — VANCOMYCIN HCL IN DEXTROSE 1-5 GM/200ML-% IV SOLN
1000.0000 mg | INTRAVENOUS | Status: DC
  Administered 2015-07-18 – 2015-07-20 (×3): 1000 mg via INTRAVENOUS
  Filled 2015-07-17 (×4): qty 200

## 2015-07-17 MED ORDER — ALBUTEROL SULFATE (2.5 MG/3ML) 0.083% IN NEBU
2.5000 mg | INHALATION_SOLUTION | RESPIRATORY_TRACT | Status: DC | PRN
  Administered 2015-07-18 (×2): 2.5 mg via RESPIRATORY_TRACT
  Filled 2015-07-17 (×2): qty 3

## 2015-07-17 MED ORDER — TAMSULOSIN HCL 0.4 MG PO CAPS
0.4000 mg | ORAL_CAPSULE | Freq: Every day | ORAL | Status: DC
  Administered 2015-07-18 – 2015-07-20 (×3): 0.4 mg via ORAL
  Filled 2015-07-17 (×3): qty 1

## 2015-07-17 MED ORDER — ACETAMINOPHEN 325 MG PO TABS: 325.0000 mg | ORAL_TABLET | Freq: Four times a day (QID) | ORAL | Status: DC | PRN

## 2015-07-17 MED ORDER — FUROSEMIDE 10 MG/ML IJ SOLN
20.0000 mg | Freq: Once | INTRAMUSCULAR | Status: AC
  Administered 2015-07-17: 20 mg via INTRAVENOUS
  Filled 2015-07-17: qty 2

## 2015-07-17 MED ORDER — TIOTROPIUM BROMIDE MONOHYDRATE 18 MCG IN CAPS
18.0000 ug | ORAL_CAPSULE | Freq: Every day | RESPIRATORY_TRACT | Status: DC
  Administered 2015-07-17 – 2015-07-20 (×3): 18 ug via RESPIRATORY_TRACT
  Filled 2015-07-17: qty 5

## 2015-07-17 MED ORDER — ACETAMINOPHEN 500 MG PO TABS: 1000.0000 mg | ORAL_TABLET | Freq: Four times a day (QID) | ORAL | Status: DC | PRN

## 2015-07-17 MED ORDER — MORPHINE SULFATE (CONCENTRATE) 10 MG/0.5ML PO SOLN: 5.0000 mg | ORAL | Status: DC | PRN

## 2015-07-17 MED ORDER — HYDROCORTISONE NA SUCCINATE PF 100 MG IJ SOLR
100.0000 mg | Freq: Three times a day (TID) | INTRAMUSCULAR | Status: DC
  Administered 2015-07-17 – 2015-07-19 (×7): 100 mg via INTRAVENOUS
  Filled 2015-07-17 (×7): qty 2

## 2015-07-17 MED ORDER — SERTRALINE HCL 50 MG PO TABS
50.0000 mg | ORAL_TABLET | Freq: Every day | ORAL | Status: DC
  Administered 2015-07-17 – 2015-07-19 (×3): 50 mg via ORAL
  Filled 2015-07-17 (×3): qty 1

## 2015-07-17 MED ORDER — INSULIN ASPART 100 UNIT/ML ~~LOC~~ SOLN
0.0000 [IU] | SUBCUTANEOUS | Status: DC
  Administered 2015-07-17: 7 [IU] via SUBCUTANEOUS

## 2015-07-17 MED ORDER — SACCHAROMYCES BOULARDII 250 MG PO CAPS
250.0000 mg | ORAL_CAPSULE | Freq: Two times a day (BID) | ORAL | Status: DC
  Administered 2015-07-17 – 2015-07-20 (×6): 250 mg via ORAL
  Filled 2015-07-17 (×6): qty 1

## 2015-07-17 MED ORDER — MORPHINE SULFATE (PF) 2 MG/ML IV SOLN: 2.0000 mg | INTRAVENOUS | Status: DC | PRN

## 2015-07-17 MED ORDER — AMIODARONE HCL IN DEXTROSE 360-4.14 MG/200ML-% IV SOLN
60.0000 mg/h | INTRAVENOUS | Status: AC
  Administered 2015-07-17: 60 mg/h via INTRAVENOUS
  Filled 2015-07-17: qty 200

## 2015-07-17 MED ORDER — ALBUTEROL SULFATE HFA 108 (90 BASE) MCG/ACT IN AERS: 1.0000 | INHALATION_SPRAY | Freq: Four times a day (QID) | RESPIRATORY_TRACT | Status: DC | PRN

## 2015-07-17 MED ORDER — SODIUM CHLORIDE 0.9 % IV BOLUS (SEPSIS)
2000.0000 mL | Freq: Once | INTRAVENOUS | Status: AC
  Administered 2015-07-17: 2000 mL via INTRAVENOUS

## 2015-07-17 MED ORDER — BISACODYL 10 MG RE SUPP: 10.0000 mg | Freq: Every day | RECTAL | Status: DC | PRN

## 2015-07-17 MED ORDER — PANTOPRAZOLE SODIUM 20 MG PO TBEC
20.0000 mg | DELAYED_RELEASE_TABLET | Freq: Every day | ORAL | Status: DC
  Administered 2015-07-18 – 2015-07-20 (×3): 20 mg via ORAL
  Filled 2015-07-17 (×5): qty 1

## 2015-07-17 MED ORDER — AMIODARONE HCL IN DEXTROSE 360-4.14 MG/200ML-% IV SOLN
30.0000 mg/h | INTRAVENOUS | Status: DC
  Administered 2015-07-17 – 2015-07-18 (×4): 30 mg/h via INTRAVENOUS
  Filled 2015-07-17 (×3): qty 200

## 2015-07-17 MED ORDER — PREDNISONE 20 MG PO TABS: 20.0000 mg | ORAL_TABLET | Freq: Every day | ORAL | Status: DC

## 2015-07-17 MED ORDER — ACETAMINOPHEN 500 MG PO TABS
1000.0000 mg | ORAL_TABLET | Freq: Once | ORAL | Status: AC
  Administered 2015-07-17: 1000 mg via ORAL
  Filled 2015-07-17: qty 2

## 2015-07-17 MED ORDER — AMIODARONE IV BOLUS ONLY 150 MG/100ML
150.0000 mg | Freq: Once | INTRAVENOUS | Status: AC
  Administered 2015-07-17: 150 mg via INTRAVENOUS
  Filled 2015-07-17: qty 100

## 2015-07-17 MED ORDER — IPRATROPIUM-ALBUTEROL 0.5-2.5 (3) MG/3ML IN SOLN
3.0000 mL | Freq: Two times a day (BID) | RESPIRATORY_TRACT | Status: DC
  Administered 2015-07-17 – 2015-07-20 (×6): 3 mL via RESPIRATORY_TRACT
  Filled 2015-07-17 (×7): qty 3
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
## 2015-07-17 NOTE — Progress Notes (Addendum)
SLP Cancellation Note  Patient Details Name: [name] MRN: [id number] DOB: June 09, 1929   Cancelled treatment:       Reason Eval/Treat Not Completed: Patient's level of consciousness;Patient not medically ready.  Patient lethargic and not awake and alert enough for POs at this time.  Patient with chronic dysphagia, multiple hospitalizations for PNA despite SLPs efforts to modify diet.  Patient's overall decline makes use of safe swallow strategies more difficult at this time and it is likely that no further diet modifications will reduce his aspiration risk; however, a repeat objective assessment is an option.  SLP could also make recommendations for least restrictive PO with lowest risk when paitent is more awake and better able to participate in trials.     Gunnar Fusi, M.A., CCC-SLP [phone]  [place] 07/17/2015, 10:33 AM
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
## 2015-07-17 NOTE — ED Notes (Signed)
Started abx before blood cultures collected, would have delayed pt care. Lab said en route to collect blood

## 2015-07-17 NOTE — Care Management Note (Addendum)
Case Management Note  Patient Details  Name: [name] MRN: [id number] Date of Birth: 02-01-1929  Subjective/Objective:     Adm w sepsis               Action/Plan:lives w friend, pt has hx of hhc w [facility]  Expected Discharge Date:                  Expected Discharge Plan:  [place]  In-House Referral:     Discharge planning Services     Post Acute Care Choice:    Choice offered to:     DME Arranged:    DME Agency:     HH Arranged:    [place] Agency:     Status of Service:     Medicare Important Message Given:    Date Medicare IM Given:    Medicare IM give by:    Date Additional Medicare IM Given:    Additional Medicare Important Message give by:     If discussed at [place] of Stay Meetings, dates discussed:    Additional Comments:ur review done  Lacretia Leigh, RN 07/17/2015, 8:30 AM

## 2015-07-17 NOTE — ED Notes (Signed)
BP 89/54 EDP notified and at bedside, verbal for 2 bolus
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
## 2015-07-17 NOTE — Progress Notes (Signed)
Pt admitted to the floor at 0645. Pt is alert and orientated x 4. Home Aid and pts POA Anderson Malta present in room. Pt has MASD on sacrum that is blanchable. Pt is from [facility] for the past 12days per POA. Anderson Malta stated that all POA paperwork should be present on pts chart from previous admissions. CHG and MRSA complete.

## 2015-07-17 NOTE — ED Notes (Signed)
HR 160's. EDP notified

## 2015-07-17 NOTE — Progress Notes (Addendum)
Patient admitted few hours ago for aspiration pneumonia, acute respiratory failure and SVT. Currently on empiric IV anti-biotics, IV amiodarone for SVT, cardiology following. Overall prognosis poor. Have conveyed this to patient's power of attorney personally. He remains DO NOT RESUSCITATE. Per power of attorney gentle medical treatment if declines further full comfort care. Pall. care has been requested to see the patient as well for goals of care.

## 2015-07-18 DIAGNOSIS — J438 Other emphysema: Secondary | ICD-10-CM

## 2015-07-18 DIAGNOSIS — Z515 Encounter for palliative care: Secondary | ICD-10-CM

## 2015-07-18 LAB — URINE CULTURE

## 2015-07-18 LAB — BASIC METABOLIC PANEL
ANION GAP: 10 (ref 5–15)
BUN: 29 mg/dL — ABNORMAL HIGH (ref 6–20)
CALCIUM: 8.1 mg/dL — AB (ref 8.9–10.3)
CHLORIDE: 111 mmol/L (ref 101–111)
CO2: 19 mmol/L — ABNORMAL LOW (ref 22–32)
CREATININE: 1.13 mg/dL (ref 0.61–1.24)
GFR calc non Af Amer: 58 mL/min — ABNORMAL LOW (ref >60)
Glucose, Bld: 215 mg/dL — ABNORMAL HIGH (ref 65–99)
Potassium: 2.9 mmol/L — ABNORMAL LOW (ref 3.5–5.1)
SODIUM: 140 mmol/L (ref 135–145)

## 2015-07-18 LAB — CBC
HCT: 29.1 % — ABNORMAL LOW (ref 39.0–52.0)
HEMOGLOBIN: 9.7 g/dL — AB (ref 13.0–17.0)
MCH: 29.5 pg (ref 26.0–34.0)
MCHC: 33.3 g/dL (ref 30.0–36.0)
MCV: 88.4 fL (ref 78.0–100.0)
Platelets: 142 10*3/uL — ABNORMAL LOW (ref 150–400)
RBC: 3.29 MIL/uL — ABNORMAL LOW (ref 4.22–5.81)
RDW: 15.2 % (ref 11.5–15.5)
WBC: 6.7 10*3/uL (ref 4.0–10.5)

## 2015-07-18 LAB — HIV ANTIBODY (ROUTINE TESTING W REFLEX): HIV Screen 4th Generation wRfx: NONREACTIVE

## 2015-07-18 LAB — HEMOGLOBIN A1C
HEMOGLOBIN A1C: 6.3 % — AB (ref 4.8–5.6)
MEAN PLASMA GLUCOSE: 134 mg/dL

## 2015-07-18 LAB — GLUCOSE, CAPILLARY
GLUCOSE-CAPILLARY: 182 mg/dL — AB (ref 65–99)
GLUCOSE-CAPILLARY: 230 mg/dL — AB (ref 65–99)
Glucose-Capillary: 175 mg/dL — ABNORMAL HIGH (ref 65–99)
Glucose-Capillary: 139 mg/dL — ABNORMAL HIGH (ref 65–99)
Glucose-Capillary: 182 mg/dL — ABNORMAL HIGH (ref 65–99)
Glucose-Capillary: 151 mg/dL — ABNORMAL HIGH (ref 65–99)

## 2015-07-18 LAB — MAGNESIUM: MAGNESIUM: 1.8 mg/dL (ref 1.7–2.4)

## 2015-07-18 MED ORDER — POTASSIUM CHLORIDE 20 MEQ/15ML (10%) PO SOLN: 40.0000 meq | Freq: Three times a day (TID) | ORAL | Status: DC

## 2015-07-18 MED ORDER — SODIUM CHLORIDE 0.9 % IV SOLN
INTRAVENOUS | Status: DC
  Administered 2015-07-18: 75 mL/h via INTRAVENOUS
  Administered 2015-07-19: 1 mL via INTRAVENOUS

## 2015-07-18 MED ORDER — MORPHINE SULFATE (CONCENTRATE) 10 MG/0.5ML PO SOLN: 5.0000 mg | ORAL | Status: DC | PRN

## 2015-07-18 MED ORDER — POTASSIUM CHLORIDE 20 MEQ/15ML (10%) PO SOLN
40.0000 meq | Freq: Four times a day (QID) | ORAL | Status: AC
  Administered 2015-07-18: 40 meq via ORAL
  Filled 2015-07-18: qty 30

## 2015-07-18 MED ORDER — MAGNESIUM SULFATE IN D5W 10-5 MG/ML-% IV SOLN
1.0000 g | Freq: Once | INTRAVENOUS | Status: AC
  Administered 2015-07-18: 1 g via INTRAVENOUS
  Filled 2015-07-18: qty 100

## 2015-07-18 MED ORDER — METOPROLOL TARTRATE 1 MG/ML IV SOLN
5.0000 mg | Freq: Three times a day (TID) | INTRAVENOUS | Status: DC
  Administered 2015-07-18 – 2015-07-20 (×6): 5 mg via INTRAVENOUS
  Filled 2015-07-18 (×6): qty 5

## 2015-07-18 MED ORDER — POTASSIUM CHLORIDE 10 MEQ/100ML IV SOLN
10.0000 meq | INTRAVENOUS | Status: AC
  Administered 2015-07-18 (×3): 10 meq via INTRAVENOUS
  Filled 2015-07-18 (×3): qty 100

## 2015-07-18 NOTE — Progress Notes (Signed)
Daily Progress Note   Patient Name: [name]       Date: 07/18/2015 DOB: 04-27-1929  Age: 81 y.o. MRN#: [id number] Attending Physician: Thurnell Lose, MD Primary Care Physician: No primary care provider on file. Admit Date: 07/16/2015  Reason for Consultation/Follow-up: Establishing goals of care, Non pain symptom management and Psychosocial/spiritual support  Subjective: Per HCPOA, pt and she had a good conversation last night, pt was alert, conversant. He is more fatigued today and having mild work of breathing at rest. He denies pain. Spoke at length with Ms. Baxter Flattery, HCPOA, about disease progression secondary to dysphagia and aspiration pna in the setting of end-stage COPD, dCHF, CAD, and prostate cancer. She is tearful when she contemplates that he is nearing EOL. She reports that prior to presenting to the hospital on 07/16/15 she admitted him to [facility] for rehab in an attempt to get him stronger and improve his quality of life and come home. Ms. Baxter Flattery has been friends with the pt and his wife ( who died in 2015-02-24 ) for over 20 years. She relates fond stories of her friendship with both he and his wife.   Interval Events: Seen by speech therapy. NPO recommended for now Length of Stay: 1 day  Current Medications: Scheduled Meds:  . aspirin  81 mg Oral q1800  . clopidogrel  75 mg Oral Daily  . docusate sodium  100 mg Oral BID  . heparin  5,000 Units Subcutaneous 3 times per day  . hydrocortisone sod succinate (SOLU-CORTEF) inj  100 mg Intravenous Q8H  . insulin aspart  0-15 Units Subcutaneous Q4H  . ipratropium-albuterol  3 mL Nebulization BID  . lactulose  10 g Oral BID  . metoprolol  5 mg Intravenous 3 times per day  . pantoprazole  20 mg Oral Daily  . piperacillin-tazobactam (ZOSYN)  IV  3.375 g Intravenous 3 times per day  . potassium chloride  40 mEq Oral Q6H  . saccharomyces boulardii  250 mg Oral BID  . sertraline  50 mg Oral QHS  . tamsulosin  0.4 mg Oral  Daily  . tiotropium  18 mcg Inhalation Daily  . vancomycin  1,000 mg Intravenous Q24H    Continuous Infusions: . sodium chloride 75 mL/hr (07/18/15 1225)  . albuterol Stopped (07/17/15 0145)    PRN Meds: acetaminophen, albuterol, bisacodyl, morphine injection  Palliative Performance Scale: 30%     Vital Signs: BP 143/78 mmHg  Pulse 82  Temp(Src) 98.7 F (37.1 C) (Oral)  Resp 15  Ht 5\' 8"  (1.727 m)  Wt 58.9 kg (129 lb 13.6 oz)  BMI 19.75 kg/m2  SpO2 96% SpO2: SpO2: 96 % O2 Device: O2 Device: Not Delivered O2 Flow Rate: O2 Flow Rate (L/min): 2 L/min  Intake/output summary:  Intake/Output Summary (Last 24 hours) at 07/18/15 1659 Last data filed at 07/18/15 0511  Gross per 24 hour  Intake    900 ml  Output    600 ml  Net    300 ml   LBM:   Baseline Weight: Weight: 58.9 kg (129 lb 13.6 oz) Most recent weight: Weight: 58.9 kg (129 lb 13.6 oz)  Physical Exam: General: Frail, acutely ill appearing elderly man. Facial flushing Resp: Increased work of breathing at rest. Productive cough with green sputum Cardiac: Tachy              Additional Data Reviewed: Recent Labs     07/17/15  0022  07/18/15  0311  WBC  12.9*  6.7  HGB  11.9*  9.7*  PLT  200  142*  NA  139  140  BUN  27*  29*  CREATININE  1.08  1.13     Problem List:  Patient Active Problem List   Diagnosis Date Noted  . SVT (supraventricular tachycardia) 07/17/2015  . Hypotension 07/17/2015  . Palliative care encounter   . HCAP (healthcare-associated pneumonia) 06/30/2015  . Sepsis 06/30/2015  . Generalized weakness 06/30/2015  . Malnutrition of moderate degree 06/22/2015  . Pressure ulcer 06/20/2015  . Urinary retention 06/20/2015  . Acute urinary retention 05/26/2015  . E coli bacteremia 05/26/2015  . Hyperglycemia 05/24/2015  . AKI (acute kidney injury) 04/18/2015  . COPD exacerbation 03/29/2015  . Shortness of breath   . UTI (lower urinary tract infection) 03/15/2015  . SOB (shortness  of breath) 03/15/2015  . Acute encephalopathy 03/15/2015  . Acute respiratory failure with hypoxia 02/10/2015  . Toxic inhalation injury 02/10/2015  . Coronary atherosclerosis of native coronary artery 02/11/2014  . Pure hypercholesterolemia 02/11/2014  . Other specified forms of chronic ischemic heart disease 02/11/2014  . COPD (chronic obstructive pulmonary disease)   . Chronic diastolic CHF (congestive heart failure)   . CKD (chronic kidney disease), stage II 04/22/2010  . Diabetes mellitus type 2, uncontrolled 02/14/2008  . Essential hypertension 02/14/2008  . G E R D 02/14/2008  . ADENOCARCINOMA, PROSTATE 08/24/2007  . Mediastinal fibrosis 08/24/2007     Palliative Care Assessment & Plan    Code Status:  DNR  Goals of Care:  Continue antibiotics, IVF in hospital as indicated  Continue NPO for now but when ready for dc HCPOA desires comfort feeds  DC to SNF for rehab. She recognizes that his ability to rehab is unlikely but "wants to give him every chance". After this she will probably seek hospice support in the facility.   Do not return to the hospital. She reports that he did not want to come to the hospital this time.   MOST form completed. Agreed to PO antibiotics, IVF in SNF only  Symptom Management:  Dyspnea: Pt is opioid naive but has had significant resp distress at times especially upon admission. Will leave low dose ms04 concentrate order 5mg  q4 prn   Psycho-social/Spiritual:  Desire for further Chaplaincy support:no   Prognosis: Days to weeks. I did share this with MS. Baxter Flattery, who despite knowing how sick he is was surprised that medical impression could be this limited. I did discuss hospice in-patient as a possible option vs. SNF. She at this point wishes to see if he can stabilize and rehab. If he declines further though we did agree to re-address this option. Discharge Planning: [facility] for rehab with Palliative care service  follow-up   Care plan was discussed with Dr. Candiss Norse. Ms. Baxter Flattery also asked me to call pt's "oldest friend", Mr. Merrie Roof at [phone] to update him him on pt's clinical condition. She is texting him and will get back in touch with me as to the status of this, but I did agree to do this. Mr. Irving Copas reportedly corresponds with pt's children with medical updates. I did offer to call the children directly but she stated that Mr. [place] typically does this  Thank you for allowing the Palliative Medicine Team to assist in the care of this patient.   Time In: 1400 Time Out: 1530 Total Time 90 Prolonged Time Billed  yes     Greater than 50%  of  this time was spent counseling and coordinating care related to the above assessment and plan.   Dory Horn, NP  07/18/2015, 4:59 PM  Please contact Palliative Medicine Team phone at [phone] for questions and concerns.

## 2015-07-18 NOTE — Progress Notes (Signed)
Patient Demographics:    [name], is a 81 y.o. male, DOB - 29-Sep-1929, OTL:[id number]  Admit date - 07/16/2015   Admitting Physician Etta Quill, DO  Outpatient Primary MD for the patient is No primary care provider on file.  LOS - 1   Chief Complaint  Patient presents with  . Shortness of Breath        Subjective:    [name] today has, No headache, No chest pain, No abdominal pain - No Nausea, No new weakness tingling or numbness, improved Cough - SOB.     Assessment  & Plan :    1. Acute hypoxic respiratory failure due to HCAP from recurrent aspiration with sepsis. Somewhat improved on empiric IV anti-biotics, nebulizer treatments and oxygen,therapy following currently nothing by mouth. Long-term prognosis does not appear to GERD. Per by mouth a gentle medical treatment of declines full comfort care.   2. SVT. Due to sepsis ,cardiology on board has been placed on amiodarone drip for now per cardiology. Currently in sinus. Might be okay to transition to beta blocker will defer that to cardiology.   3. Hypotension. Due to sepsis, also was chronically on prednisone, blood pressure much better with supportive care for sepsis, rate controlled for SVT and stress dose steroids which will be continued for now.   4. COPD. At baseline. No wheezing, he is on chronic prednisone, currently on IV hydrocortisone along with oxygen and nebulizer treatments as needed.   5. DM type II. SSI continue   6. Hx of CAD. No acute issues. Continue aspirin and Plavix for secondary prevention.   7. GERD. On PPI continue.    Code Status : DNR  Family Communication  : POA and caregiver  Disposition Plan  : Likely home hospice  Consults  :  Pall Care, Cards  Procedures  :    DVT  Prophylaxis  :   Heparin   Lab Results  Component Value Date   PLT 142* 07/18/2015    Inpatient Medications  Scheduled Meds: . aspirin  81 mg Oral q1800  . clopidogrel  75 mg Oral Daily  . docusate sodium  100 mg Oral BID  . heparin  5,000 Units Subcutaneous 3 times per day  . hydrocortisone sod succinate (SOLU-CORTEF) inj  100 mg Intravenous Q8H  . insulin aspart  0-15 Units Subcutaneous Q4H  . ipratropium-albuterol  3 mL Nebulization BID  . lactulose  10 g Oral BID  . pantoprazole  20 mg Oral Daily  . piperacillin-tazobactam (ZOSYN)  IV  3.375 g Intravenous 3 times per day  . saccharomyces boulardii  250 mg Oral BID  . sertraline  50 mg Oral QHS  . tamsulosin  0.4 mg Oral Daily  . tiotropium  18 mcg Inhalation Daily  . vancomycin  1,000 mg Intravenous Q24H   Continuous Infusions: . albuterol Stopped (07/17/15 0145)  . amiodarone 30 mg/hr (07/18/15 0945)   PRN Meds:.acetaminophen, albuterol, bisacodyl, morphine injection  Antibiotics  :    Anti-infectives    Start     Dose/Rate Route Frequency Ordered Stop   07/18/15 0000  vancomycin (VANCOCIN) IVPB 1000 mg/200 mL premix     1,000 mg 200 mL/hr over 60 Minutes Intravenous Every 24 hours 07/17/15  0617     07/17/15 0630  piperacillin-tazobactam (ZOSYN) IVPB 3.375 g     3.375 g 12.5 mL/hr over 240 Minutes Intravenous 3 times per day 07/17/15 0617     07/17/15 0615  piperacillin-tazobactam (ZOSYN) IVPB 3.375 g  Status:  Discontinued     3.375 g 100 mL/hr over 30 Minutes Intravenous 3 times per day 07/17/15 0607 07/17/15 0616   07/16/15 2315  vancomycin (VANCOCIN) IVPB 1000 mg/200 mL premix     1,000 mg 200 mL/hr over 60 Minutes Intravenous  Once 07/16/15 2313 07/17/15 0145   07/16/15 2315  piperacillin-tazobactam (ZOSYN) IVPB 3.375 g     3.375 g 100 mL/hr over 30 Minutes Intravenous  Once 07/16/15 2313 07/17/15 0025        Objective:   Filed Vitals:   07/18/15 0600 07/18/15 0700 07/18/15 0801 07/18/15 0857  BP:  115/59 125/53 137/56   Pulse: 63 58 60   Temp:   97.4 F (36.3 C)   TempSrc:   Axillary   Resp: 19 19 23    Height:      Weight:      SpO2: 99% 98% 97% 98%    Wt Readings from Last 3 Encounters:  07/17/15 58.9 kg (129 lb 13.6 oz)  07/03/15 61.2 kg (134 lb 14.7 oz)  06/25/15 60.2 kg (132 lb 11.5 oz)     Intake/Output Summary (Last 24 hours) at 07/18/15 1019 Last data filed at 07/18/15 0511  Gross per 24 hour  Intake 1106.12 ml  Output    600 ml  Net 506.12 ml     Physical Exam  Awake , Oriented X 1, No new F.N deficits, Normal affect [place].AT,PERRAL Supple Neck,No JVD, No cervical lymphadenopathy appriciated.  Symmetrical Chest wall movement, Good air movement bilaterally, bibasilar rales RRR,No Gallops,Rubs or new Murmurs, No Parasternal Heave +ve B.Sounds, Abd Soft, No tenderness, No organomegaly appriciated, No rebound - guarding or rigidity. No Cyanosis, Clubbing or edema, No new Rash or bruise       Data Review:   Micro Results Recent Results (from the past 240 hour(s))  Urine culture     Status: None   Collection Time: 07/17/15 12:49 AM  Result Value Ref Range Status   Specimen Description URINE, RANDOM  Final   Special Requests NONE  Final   Culture MULTIPLE SPECIES PRESENT, SUGGEST RECOLLECTION  Final   Report Status 07/18/2015 FINAL  Final  MRSA PCR Screening     Status: None   Collection Time: 07/17/15  6:55 AM  Result Value Ref Range Status   MRSA by PCR NEGATIVE NEGATIVE Final    Comment:        The GeneXpert MRSA Assay (FDA approved for NASAL specimens only), is one component of a comprehensive MRSA colonization surveillance program. It is not intended to diagnose MRSA infection nor to guide or monitor treatment for MRSA infections.     Radiology Reports    Dg Chest Portable 1 View  07/17/2015   CLINICAL DATA:  81 year old male with shortness of breath and decreased O2 sats since the last radiograph.  EXAM: PORTABLE CHEST - 1 VIEW   COMPARISON:  There earlier radiograph dated 07/16/2015  FINDINGS: There is marked cardiomegaly. There is opacification of the left lower lung field concerning for left-sided pleural effusion with associated atelectasis/ pneumonia. A focal area of increased density is noted in the left mid lung field, new from prior study. The right lung is clear. There is blunting of the right costophrenic angle  possibly trace right pleural effusion. Multiple calcified hilar and mediastinal granuloma again noted. The osseous structures are grossly unremarkable.  IMPRESSION: Marked cardiomegaly.  Opacification of the left lower lung field possibly related to pleural effusion and associated atelectasis/ pneumonia. PA and lateral views or CT of the chest may provide better evaluation clinically indicated.   Electronically Signed   By: Anner Crete M.D.   On: 07/17/2015 03:55   Dg Chest Port 1 View  07/16/2015   CLINICAL DATA:  81 year old male with shortness of breath  EXAM: PORTABLE CHEST - 1 VIEW  COMPARISON:  Chest radiograph dated 06/12/2015 and CT dated 04/23/2015  FINDINGS: Single-view of the chest demonstrate emphysematous changes of the lungs. No focal consolidation, pleural effusion, or pneumothorax. Multiple bilateral hilar and mediastinal calcified lymph nodes again noted. Stable cardiomegaly. The osseous structures are grossly unremarkable.  IMPRESSION: No acute cardiopulmonary process.   Electronically Signed   By: Anner Crete M.D.   On: 07/16/2015 23:38        CBC  Recent Labs Lab 07/17/15 0022 07/18/15 0311  WBC 12.9* 6.7  HGB 11.9* 9.7*  HCT 37.1* 29.1*  PLT 200 142*  MCV 89.6 88.4  MCH 28.7 29.5  MCHC 32.1 33.3  RDW 15.5 15.2  LYMPHSABS 0.5*  --   MONOABS 0.5  --   EOSABS 0.0  --   BASOSABS 0.0  --     Chemistries   Recent Labs Lab 07/17/15 0022 07/18/15 0311  NA 139 140  K 4.0 2.9*  CL 109 111  CO2 19* 19*  GLUCOSE 170* 215*  BUN 27* 29*  CREATININE 1.08 1.13  CALCIUM  8.6* 8.1*  MG  --  1.8  AST 18  --   ALT 8*  --   ALKPHOS 51  --   BILITOT 1.0  --    ------------------------------------------------------------------------------------------------------------------ estimated creatinine clearance is 40.5 mL/min (by C-G formula based on Cr of 1.13). ------------------------------------------------------------------------------------------------------------------  Recent Labs  07/17/15 1225  HGBA1C 6.3*   ------------------------------------------------------------------------------------------------------------------ No results for input(s): CHOL, HDL, LDLCALC, TRIG, CHOLHDL, LDLDIRECT in the last 72 hours. ------------------------------------------------------------------------------------------------------------------  Recent Labs  07/17/15 1225  TSH 2.985   ------------------------------------------------------------------------------------------------------------------ No results for input(s): VITAMINB12, FOLATE, FERRITIN, TIBC, IRON, RETICCTPCT in the last 72 hours.  Coagulation profile  Recent Labs Lab 07/17/15 0022  INR 1.15    No results for input(s): DDIMER in the last 72 hours.  Cardiac Enzymes No results for input(s): CKMB, TROPONINI, MYOGLOBIN in the last 168 hours.  Invalid input(s): CK ------------------------------------------------------------------------------------------------------------------ Invalid input(s): POCBNP   Time Spent in minutes   35   SINGH,PRASHANT K M.D on 07/18/2015 at 10:19 AM  Between 7am to 7pm - Pager - [phone]  After 7pm go to [email or web address] - password [facility]  [facility] -  Office  [phone]

## 2015-07-18 NOTE — Evaluation (Addendum)
Physical Therapy Evaluation Patient Details Name: [name] MRN: [id number] DOB: [birth year] Today's Date: 07/18/2015   History of Present Illness  Patient is an 81 yo male admitted 07/16/15 with acute respiratory failure with hypoxia.  Patient with HCAP due to aspiration and sepsis, tachycardia, and hypotension.  PMH:  COPD, DM, CAD  Clinical Impression  Patient presents with problems listed below.  Per patient and caregiver, patient's goal is to be able to transfer with assist again.  PT will follow for strengthening and transfer training as patient tolerates.  Patient to d/c to SNF for continued therapy with Palliative Care involvement.    Follow Up Recommendations SNF;Supervision/Assistance - 24 hour    Equipment Recommendations  None recommended by PT    Recommendations for Other Services       Precautions / Restrictions Precautions Precautions: Fall Restrictions Weight Bearing Restrictions: No      Mobility  Bed Mobility Overal bed mobility: Needs Assistance;+2 for physical assistance Bed Mobility: Rolling Rolling: Max assist;+2 for physical assistance         General bed mobility comments: Patient requires max assist to roll to both sides.  Once he reaches bed rail, he can use UE to help pull his upper body over.  Patient with increased WOB.  Transfers                 General transfer comment: Declined - due to breathing limitations  Ambulation/Gait                Stairs            Wheelchair Mobility    Modified Rankin (Stroke Patients Only)       Balance                                             Pertinent Vitals/Pain Pain Assessment: No/denies pain    Home Living Family/patient expects to be discharged to:: Skilled nursing facility Living Arrangements: Non-relatives/Friends Available Help at Discharge: Friend(s);Personal care attendant;Available 24 hours/day           Home Equipment: Walker - 2  wheels;Cane - single point;Bedside commode;Wheelchair - manual;Hospital bed      Prior Function Level of Independence: Needs assistance   Gait / Transfers Assistance Needed: per caregiver pivots to the w/c with assist.  ADL's / Homemaking Assistance Needed: Assist with bathing, dressing, food prep        Hand Dominance   Dominant Hand: Right    Extremity/Trunk Assessment   Upper Extremity Assessment: RUE deficits/detail;LUE deficits/detail RUE Deficits / Details: Shoulder ROM limited to 90* flexion.  Strength 3-/5     LUE Deficits / Details: Decreased shoulder ROM to < 90*, and elbow at -20* extension.  Strength grossly 3-/5   Lower Extremity Assessment: RLE deficits/detail;LLE deficits/detail RLE Deficits / Details: Strength grossly 2+/5; Decreased hip flexion and rotation. LLE Deficits / Details: Strength grossly 2+/5; Decreased hip flexion and rotation.     Communication   Communication: Expressive difficulties (Very low volume (weak))  Cognition Arousal/Alertness: Awake/alert Behavior During Therapy: Flat affect Overall Cognitive Status: History of cognitive impairments - at baseline                      General Comments      Exercises General Exercises - Upper Extremity Shoulder Flexion: AAROM;PROM;Both;5 reps;Supine Shoulder Horizontal ABduction:  AAROM;PROM;Both;5 reps;Supine Elbow Extension: AAROM;PROM;Both;5 reps;Supine General Exercises - Lower Extremity Ankle Circles/Pumps: AAROM;PROM;Both;5 reps;Supine Heel Slides: AAROM;PROM;Both;5 reps;Supine Other Exercises Other Exercises: AAROM/PROM to bil shoulders - internal and external rotation, 5 reps Other Exercises: AAROM, PROM, bil hip internal and external rotation, 5 reps, supine Other Exercises: PROM, hamstring stretch, 5 reps, BLE's, supine      Assessment/Plan    PT Assessment Patient needs continued PT services  PT Diagnosis Difficulty walking;Generalized weakness   PT Problem List  Decreased strength;Decreased range of motion;Decreased activity tolerance;Decreased mobility;Cardiopulmonary status limiting activity  PT Treatment Interventions DME instruction;Functional mobility training;Therapeutic activities;Therapeutic exercise;Balance training;Patient/family education   PT Goals (Current goals can be found in the Care Plan section) Acute Rehab PT Goals Patient Stated Goal: To be able to transfer to w/c so that he can go for rides in the car. PT Goal Formulation: With patient/family Time For Goal Achievement: 08/01/15 Potential to Achieve Goals: Poor    Frequency Min 2X/week   Barriers to discharge        Co-evaluation               End of Session   Activity Tolerance: Patient limited by fatigue;Patient limited by pain Patient left: in bed;with call bell/phone within reach;with family/visitor present           Time: 1857-1930 PT Time Calculation (min) (ACUTE ONLY): 33 min   Charges:   PT Evaluation $Initial PT Evaluation Tier I: 1 Procedure PT Treatments $Therapeutic Exercise: 8-22 mins   PT G Codes:        Despina Pole Jul 30, 2015, 8:49 PM Carita Pian. Sanjuana Kava, [place] Pager [phone]

## 2015-07-18 NOTE — Progress Notes (Signed)
Dr. Radford Pax cardiologist Subjective:  Asleep, caregiver Maudie Mercury is in room.  Objective:  Vital Signs in the last 24 hours: Temp:  [97.4 F (36.3 C)-97.7 F (36.5 C)] 97.4 F (36.3 C) (09/10 0801) Pulse Rate:  [58-78] 60 (09/10 0801) Resp:  [19-30] 23 (09/10 0801) BP: (94-137)/(46-65) 137/56 mmHg (09/10 0801) SpO2:  [91 %-99 %] 98 % (09/10 0857)  Intake/Output from previous day: 09/09 0701 - 09/10 0700 In: 1206 [I.V.:806; IV Piggyback:400] Out: 600 [Urine:600]   Physical Exam: General: Ill-appearing, sleeping no acute distress. Head:  Normocephalic and atraumatic. Lungs: Rhonchi bilaterally Heart: Normal S1 and S2.  No murmur, rubs or gallops.  Abdomen: soft, non-tender, positive bowel sounds. Extremities: No clubbing or cyanosis. No edema. Neurologic: Asleep    Lab Results:  Recent Labs  07/17/15 0022 07/18/15 0311  WBC 12.9* 6.7  HGB 11.9* 9.7*  PLT 200 142*    Recent Labs  07/17/15 0022 07/18/15 0311  NA 139 140  K 4.0 2.9*  CL 109 111  CO2 19* 19*  GLUCOSE 170* 215*  BUN 27* 29*  CREATININE 1.08 1.13   No results for input(s): TROPONINI in the last 72 hours.  Invalid input(s): CK, MB Hepatic Function Panel  Recent Labs  07/17/15 0022  PROT 5.3*  ALBUMIN 2.7*  AST 18  ALT 8*  ALKPHOS 51  BILITOT 1.0   No results for input(s): CHOL in the last 72 hours. No results for input(s): PROTIME in the last 72 hours.  Imaging: Dg Chest Portable 1 View  07/17/2015   CLINICAL DATA:  81 year old male with shortness of breath and decreased O2 sats since the last radiograph.  EXAM: PORTABLE CHEST - 1 VIEW  COMPARISON:  There earlier radiograph dated 07/16/2015  FINDINGS: There is marked cardiomegaly. There is opacification of the left lower lung field concerning for left-sided pleural effusion with associated atelectasis/ pneumonia. A focal area of increased density is noted in the left mid lung field, new from prior study. The right lung is clear. There is  blunting of the right costophrenic angle possibly trace right pleural effusion. Multiple calcified hilar and mediastinal granuloma again noted. The osseous structures are grossly unremarkable.  IMPRESSION: Marked cardiomegaly.  Opacification of the left lower lung field possibly related to pleural effusion and associated atelectasis/ pneumonia. PA and lateral views or CT of the chest may provide better evaluation clinically indicated.   Electronically Signed   By: Anner Crete M.D.   On: 07/17/2015 03:55   Dg Chest Port 1 View  07/16/2015   CLINICAL DATA:  81 year old male with shortness of breath  EXAM: PORTABLE CHEST - 1 VIEW  COMPARISON:  Chest radiograph dated 06/12/2015 and CT dated 04/23/2015  FINDINGS: Single-view of the chest demonstrate emphysematous changes of the lungs. No focal consolidation, pleural effusion, or pneumothorax. Multiple bilateral hilar and mediastinal calcified lymph nodes again noted. Stable cardiomegaly. The osseous structures are grossly unremarkable.  IMPRESSION: No acute cardiopulmonary process.   Electronically Signed   By: Anner Crete M.D.   On: 07/16/2015 23:38   Personally viewed.   Telemetry: No further SVT. Sinus rhythm. Personally viewed.   EKG:  07/17/15 at 2:40 AM SVT 162 bpm with corresponding ST segment changes secondary to tachycardia.  Cardiac Studies:  Normal ejection fraction on May 2016   Assessment/Plan:  Principal Problem:   Acute respiratory failure with hypoxia Active Problems:   Diabetes mellitus type 2, uncontrolled   COPD (chronic obstructive pulmonary disease)   HCAP (healthcare-associated  pneumonia)   Sepsis   SVT (supraventricular tachycardia)   Hypotension   Palliative care encounter  81 year old male with recurrent pneumonia, palliative care encounter, SVT with previous hemodynamic instability, adenosine administered, amiodarone IV.    1. PSVT  - At this point, I will stop his amiodarone IV. It was not intended for him to  have long-term anti-rhythmic therapy as described by Dr. Gwenlyn Found consult note. I agree with this. Has underlying COPD as well.  - We will give him metoprolol IV 5 mg every 8 hours.   - When able, transition him to by mouth metoprolol.  2. Pneumonia recurrence  - Per primary team.  - Likely driving PSVT  3. Palliative care encounter  - Appears quite ill. Having discussion with family.  4. History of coronary artery disease-has been on both aspirin and Plavix.   Abbigayle Toole, [place] 07/18/2015, 11:06 AM

## 2015-07-18 NOTE — Progress Notes (Signed)
Speech Language Pathology Treatment: Dysphagia  Patient Details Name: [name] MRN: [id number] DOB: 1929/03/30 Today's Date: 07/18/2015 Time: 1856-3149 SLP Time Calculation (min) (ACUTE ONLY): 27 min  Assessment / Plan / Recommendation Clinical Impression  Pt appears very fatigued, with low vocal intensity, and congested breath sounds prior to PO trials. Following ice chips x1, pt with delayed cough and expelled green thick secretions. Pt with history of recurrent PNA likely aspiration etiology with instrumental study confirming silent aspiration (June 2016). Caregivers confirm most recent diet prior to admission nectar thick and mechanical soft, poor tolerance noted given current chest x ray left lower lobe opacity suspicious for PNA. Given recurrent PNA history with known silent aspiration, reduced respiratory functioning, and difficulty with PO trials this date recommend MBS prior to diet intiation. Continue NPO except meds: crushed with puree. ST to attempt MBS this date as scheduling permits.    HPI Other Pertinent Information: Patient is an 81 year old man with a history of multiple admissions over the past 6 months for sepsis, recurrent pneumonia, recurrent urinary tract infections, and worsening functional status. Patient presented to the emergency room on 07/16/2015 with complaints of shortness of breath. His chest x-ray showed a left lower lobe opacity. He was febrile with a temperature of 101.8. He was tachycardic and ultimately converted into an unstable SVT.   Pertinent Vitals    SLP Plan  MBS    Recommendations Diet recommendations: NPO Medication Administration: Crushed with puree              Oral Care Recommendations: Oral care QID Follow up Recommendations: Skilled Nursing facility;24 hour supervision/assistance Plan: MBS    GO    Arvil Chaco MA, [place] Language Pathologist    Levi Aland 07/18/2015, 1:46 PM

## 2015-07-19 ENCOUNTER — Inpatient Hospital Stay (HOSPITAL_COMMUNITY): Payer: Medicare Other

## 2015-07-19 LAB — MAGNESIUM: Magnesium: 2.1 mg/dL (ref 1.7–2.4)

## 2015-07-19 LAB — POTASSIUM: POTASSIUM: 3.9 mmol/L (ref 3.5–5.1)

## 2015-07-19 LAB — GLUCOSE, CAPILLARY
GLUCOSE-CAPILLARY: 114 mg/dL — AB (ref 65–99)
GLUCOSE-CAPILLARY: 169 mg/dL — AB (ref 65–99)
GLUCOSE-CAPILLARY: 267 mg/dL — AB (ref 65–99)
Glucose-Capillary: 120 mg/dL — ABNORMAL HIGH (ref 65–99)
Glucose-Capillary: 135 mg/dL — ABNORMAL HIGH (ref 65–99)
Glucose-Capillary: 159 mg/dL — ABNORMAL HIGH (ref 65–99)

## 2015-07-19 MED ORDER — HYDROCORTISONE NA SUCCINATE PF 100 MG IJ SOLR
50.0000 mg | Freq: Two times a day (BID) | INTRAMUSCULAR | Status: DC
  Administered 2015-07-19 – 2015-07-20 (×2): 50 mg via INTRAVENOUS
  Filled 2015-07-19 (×3): qty 2

## 2015-07-19 MED ORDER — KCL IN DEXTROSE-NACL 20-5-0.9 MEQ/L-%-% IV SOLN
INTRAVENOUS | Status: DC
  Administered 2015-07-19: 12:00:00 via INTRAVENOUS
  Administered 2015-07-20: 1 mL via INTRAVENOUS
  Filled 2015-07-19 (×2): qty 1000

## 2015-07-19 MED ORDER — INSULIN ASPART 100 UNIT/ML ~~LOC~~ SOLN
0.0000 [IU] | SUBCUTANEOUS | Status: DC
  Administered 2015-07-19: 3 [IU] via SUBCUTANEOUS
  Administered 2015-07-20: 5 [IU] via SUBCUTANEOUS
  Administered 2015-07-20 (×2): 2 [IU] via SUBCUTANEOUS
  Administered 2015-07-20: 3 [IU] via SUBCUTANEOUS

## 2015-07-19 MED ORDER — SODIUM CHLORIDE 0.9 % IV SOLN: INTRAVENOUS | Status: DC

## 2015-07-19 MED ORDER — RESOURCE THICKENUP CLEAR PO POWD
ORAL | Status: DC | PRN
  Filled 2015-07-19 (×2): qty 125

## 2015-07-19 NOTE — Progress Notes (Signed)
MBSS complete. Full report located under chart review in imaging section.  Sri Clegg MA, CCC-SLP [phone]   

## 2015-07-19 NOTE — Progress Notes (Signed)
     Palliative care note reviewed Off AMIO for SVT OK to continue Bb and change to PO when able. No further recommendations  Will sign off. Please call if ?Marland Kitchen  Candee Furbish, MD

## 2015-07-19 NOTE — Progress Notes (Signed)
Patient Demographics:    [name], is a 81 y.o. male, DOB - 21-Nov-1928, WCB:[id number]  Admit date - 07/16/2015   Admitting Physician Etta Quill, DO  Outpatient Primary MD for the patient is No primary care provider on file.  LOS - 2   Chief Complaint  Patient presents with  . Shortness of Breath        Subjective:    [name] today has, No headache, No chest pain, No abdominal pain - No Nausea, No new weakness tingling or numbness, improved Cough - SOB.     Assessment  & Plan :    1. Acute hypoxic respiratory failure due to HCAP from recurrent aspiration with sepsis. Somewhat improved on empiric IV anti-biotics, nebulizer treatments and oxygen,therapy following currently nothing by mouth. Long-term prognosis does not appear to GERD. Per by mouth a gentle medical treatment of declines full comfort care.   2. SVT. Due to sepsis ,cardiology on board amiodarone drip now stopped. Currently in sinus. On scheduled IV beta blocker. Once taking oral diet will switch to oral beta blocker.   3. Hypotension. Due to sepsis, also was chronically on prednisone, blood pressure much better with supportive care for sepsis, rate control for SVT and stress dose steroids , will taper him to home dose steroids gradually. He takes 20 mg of prednisone on a daily basis.   4. COPD. At baseline. No wheezing, he is on chronic prednisone, thyroid taper as above, continue with oxygen and nebulizer treatments as needed.   5. DM type II. SSI continue  CBG (last 3)   Recent Labs  07/19/15 0008 07/19/15 0355 07/19/15 0736  GLUCAP 135* 120* 114*    6. Hx of CAD. No acute issues. Continue aspirin and Plavix for secondary prevention.   7. GERD. On PPI continue.    Code Status : DNR  Family  Communication  : POA and caregiver  Disposition Plan  : SNF  Consults  :  Pall Care, Cards  Procedures  :    DVT Prophylaxis  :   Heparin   Lab Results  Component Value Date   PLT 142* 07/18/2015    Inpatient Medications  Scheduled Meds: . aspirin  81 mg Oral q1800  . clopidogrel  75 mg Oral Daily  . docusate sodium  100 mg Oral BID  . heparin  5,000 Units Subcutaneous 3 times per day  . hydrocortisone sod succinate (SOLU-CORTEF) inj  100 mg Intravenous Q8H  . insulin aspart  0-15 Units Subcutaneous Q4H  . ipratropium-albuterol  3 mL Nebulization BID  . lactulose  10 g Oral BID  . metoprolol  5 mg Intravenous 3 times per day  . pantoprazole  20 mg Oral Daily  . piperacillin-tazobactam (ZOSYN)  IV  3.375 g Intravenous 3 times per day  . saccharomyces boulardii  250 mg Oral BID  . sertraline  50 mg Oral QHS  . tamsulosin  0.4 mg Oral Daily  . tiotropium  18 mcg Inhalation Daily  . vancomycin  1,000 mg Intravenous Q24H   Continuous Infusions: . sodium chloride 1 mL (07/19/15 0651)  . albuterol Stopped (07/17/15 0145)   PRN Meds:.acetaminophen, albuterol, bisacodyl, morphine injection, morphine CONCENTRATE  Antibiotics  :  Anti-infectives    Start     Dose/Rate Route Frequency Ordered Stop   07/18/15 0000  vancomycin (VANCOCIN) IVPB 1000 mg/200 mL premix     1,000 mg 200 mL/hr over 60 Minutes Intravenous Every 24 hours 07/17/15 0617     07/17/15 0630  piperacillin-tazobactam (ZOSYN) IVPB 3.375 g     3.375 g 12.5 mL/hr over 240 Minutes Intravenous 3 times per day 07/17/15 0617     07/17/15 0615  piperacillin-tazobactam (ZOSYN) IVPB 3.375 g  Status:  Discontinued     3.375 g 100 mL/hr over 30 Minutes Intravenous 3 times per day 07/17/15 0607 07/17/15 0616   07/16/15 2315  vancomycin (VANCOCIN) IVPB 1000 mg/200 mL premix     1,000 mg 200 mL/hr over 60 Minutes Intravenous  Once 07/16/15 2313 07/17/15 0145   07/16/15 2315  piperacillin-tazobactam (ZOSYN) IVPB 3.375 g      3.375 g 100 mL/hr over 30 Minutes Intravenous  Once 07/16/15 2313 07/17/15 0025        Objective:   Filed Vitals:   07/18/15 1333 07/18/15 1623 07/18/15 2100 07/18/15 2229  BP:  143/78  140/66  Pulse:  82 74 70  Temp:  98.7 F (37.1 C)  97.6 F (36.4 C)  TempSrc:  Oral  Oral  Resp:  15 18 16   Height:      Weight:      SpO2: 100% 96% 97% 96%    Wt Readings from Last 3 Encounters:  07/17/15 58.9 kg (129 lb 13.6 oz)  07/03/15 61.2 kg (134 lb 14.7 oz)  06/25/15 60.2 kg (132 lb 11.5 oz)     Intake/Output Summary (Last 24 hours) at 07/19/15 0956 Last data filed at 07/18/15 1710  Gross per 24 hour  Intake      0 ml  Output      0 ml  Net      0 ml     Physical Exam  Awake , Oriented X 1, No new F.N deficits, Normal affect [place].AT,PERRAL Supple Neck,No JVD, No cervical lymphadenopathy appriciated.  Symmetrical Chest wall movement, Good air movement bilaterally, bibasilar rales RRR,No Gallops,Rubs or new Murmurs, No Parasternal Heave +ve B.Sounds, Abd Soft, No tenderness, No organomegaly appriciated, No rebound - guarding or rigidity. No Cyanosis, Clubbing or edema, No new Rash or bruise, indwelling Foley in place      Data Review:   Micro Results Recent Results (from the past 240 hour(s))  Culture, blood (routine x 2)     Status: None (Preliminary result)   Collection Time: 07/17/15 12:20 AM  Result Value Ref Range Status   Specimen Description BLOOD LEFT HAND  Final   Special Requests BOTTLES DRAWN AEROBIC ONLY 3CC  Final   Culture NO GROWTH 1 DAY  Final   Report Status PENDING  Incomplete  Urine culture     Status: None   Collection Time: 07/17/15 12:49 AM  Result Value Ref Range Status   Specimen Description URINE, RANDOM  Final   Special Requests NONE  Final   Culture MULTIPLE SPECIES PRESENT, SUGGEST RECOLLECTION  Final   Report Status 07/18/2015 FINAL  Final  MRSA PCR Screening     Status: None   Collection Time: 07/17/15  6:55 AM  Result Value Ref  Range Status   MRSA by PCR NEGATIVE NEGATIVE Final    Comment:        The GeneXpert MRSA Assay (FDA approved for NASAL specimens only), is one component of a comprehensive MRSA colonization surveillance  program. It is not intended to diagnose MRSA infection nor to guide or monitor treatment for MRSA infections.     Radiology Reports    Dg Chest Portable 1 View  07/17/2015   CLINICAL DATA:  81 year old male with shortness of breath and decreased O2 sats since the last radiograph.  EXAM: PORTABLE CHEST - 1 VIEW  COMPARISON:  There earlier radiograph dated 07/16/2015  FINDINGS: There is marked cardiomegaly. There is opacification of the left lower lung field concerning for left-sided pleural effusion with associated atelectasis/ pneumonia. A focal area of increased density is noted in the left mid lung field, new from prior study. The right lung is clear. There is blunting of the right costophrenic angle possibly trace right pleural effusion. Multiple calcified hilar and mediastinal granuloma again noted. The osseous structures are grossly unremarkable.  IMPRESSION: Marked cardiomegaly.  Opacification of the left lower lung field possibly related to pleural effusion and associated atelectasis/ pneumonia. PA and lateral views or CT of the chest may provide better evaluation clinically indicated.   Electronically Signed   By: Anner Crete M.D.   On: 07/17/2015 03:55   Dg Chest Port 1 View  07/16/2015   CLINICAL DATA:  81 year old male with shortness of breath  EXAM: PORTABLE CHEST - 1 VIEW  COMPARISON:  Chest radiograph dated 06/12/2015 and CT dated 04/23/2015  FINDINGS: Single-view of the chest demonstrate emphysematous changes of the lungs. No focal consolidation, pleural effusion, or pneumothorax. Multiple bilateral hilar and mediastinal calcified lymph nodes again noted. Stable cardiomegaly. The osseous structures are grossly unremarkable.  IMPRESSION: No acute cardiopulmonary process.    Electronically Signed   By: Anner Crete M.D.   On: 07/16/2015 23:38        CBC  Recent Labs Lab 07/17/15 0022 07/18/15 0311  WBC 12.9* 6.7  HGB 11.9* 9.7*  HCT 37.1* 29.1*  PLT 200 142*  MCV 89.6 88.4  MCH 28.7 29.5  MCHC 32.1 33.3  RDW 15.5 15.2  LYMPHSABS 0.5*  --   MONOABS 0.5  --   EOSABS 0.0  --   BASOSABS 0.0  --     Chemistries   Recent Labs Lab 07/17/15 0022 07/18/15 0311 07/19/15 0821  NA 139 140  --   K 4.0 2.9* 3.9  CL 109 111  --   CO2 19* 19*  --   GLUCOSE 170* 215*  --   BUN 27* 29*  --   CREATININE 1.08 1.13  --   CALCIUM 8.6* 8.1*  --   MG  --  1.8 2.1  AST 18  --   --   ALT 8*  --   --   ALKPHOS 51  --   --   BILITOT 1.0  --   --    ------------------------------------------------------------------------------------------------------------------ estimated creatinine clearance is 40.5 mL/min (by C-G formula based on Cr of 1.13). ------------------------------------------------------------------------------------------------------------------  Recent Labs  07/17/15 1225  HGBA1C 6.3*   ------------------------------------------------------------------------------------------------------------------ No results for input(s): CHOL, HDL, LDLCALC, TRIG, CHOLHDL, LDLDIRECT in the last 72 hours. ------------------------------------------------------------------------------------------------------------------  Recent Labs  07/17/15 1225  TSH 2.985   ------------------------------------------------------------------------------------------------------------------ No results for input(s): VITAMINB12, FOLATE, FERRITIN, TIBC, IRON, RETICCTPCT in the last 72 hours.  Coagulation profile  Recent Labs Lab 07/17/15 0022  INR 1.15    No results for input(s): DDIMER in the last 72 hours.  Cardiac Enzymes No results for input(s): CKMB, TROPONINI, MYOGLOBIN in the last 168 hours.  Invalid input(s):  CK ------------------------------------------------------------------------------------------------------------------ Invalid input(s): POCBNP  Time Spent in minutes   35   SINGH,PRASHANT K M.D on 07/19/2015 at 9:56 AM  Between 7am to 7pm - Pager - [phone]  After 7pm go to [email or web address] - password [email or web address]  [facility] -  Office  [phone]

## 2015-07-20 LAB — LEGIONELLA ANTIGEN, URINE

## 2015-07-20 LAB — GLUCOSE, CAPILLARY
GLUCOSE-CAPILLARY: 140 mg/dL — AB (ref 65–99)
GLUCOSE-CAPILLARY: 229 mg/dL — AB (ref 65–99)
GLUCOSE-CAPILLARY: 231 mg/dL — AB (ref 65–99)
Glucose-Capillary: 192 mg/dL — ABNORMAL HIGH (ref 65–99)

## 2015-07-20 MED ORDER — AMOXICILLIN-POT CLAVULANATE 875-125 MG PO TABS: 1.0000 | ORAL_TABLET | Freq: Two times a day (BID) | ORAL | Status: DC

## 2015-07-20 MED ORDER — METOPROLOL TARTRATE 25 MG PO TABS: 25.0000 mg | ORAL_TABLET | Freq: Two times a day (BID) | ORAL | Status: AC

## 2015-07-20 MED ORDER — INSULIN ASPART 100 UNIT/ML ~~LOC~~ SOLN: SUBCUTANEOUS | Status: AC

## 2015-07-20 MED ORDER — MORPHINE SULFATE (CONCENTRATE) 10 MG/0.5ML PO SOLN: 10.0000 mg | ORAL | Status: DC | PRN

## 2015-07-20 MED ORDER — AMOXICILLIN-POT CLAVULANATE 875-125 MG PO TABS
1.0000 | ORAL_TABLET | Freq: Two times a day (BID) | ORAL | Status: DC
  Administered 2015-07-20: 1 via ORAL
  Filled 2015-07-20: qty 1

## 2015-07-20 MED ORDER — METOPROLOL TARTRATE 25 MG PO TABS
25.0000 mg | ORAL_TABLET | Freq: Two times a day (BID) | ORAL | Status: DC
  Administered 2015-07-20: 25 mg via ORAL
  Filled 2015-07-20: qty 1

## 2015-07-20 NOTE — Care Management Important Message (Signed)
Important Message  Patient Details  Name: [name] MRN: [id number] Date of Birth: 11/15/1928   Medicare Important Message Given:  Yes-second notification given    Loann Quill 07/20/2015, 12:37 PM

## 2015-07-20 NOTE — Discharge Summary (Signed)
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   Blood pressure 143/59, pulse 60, temperature 97.8 F (36.6 C), temperature source Oral, resp. rate 16, height 5\' 8"  (1.727 m), weight 58.9 kg (129 lb 13.6 oz), SpO2 100 %.   Intake/Output Summary (Last 24 hours) at 07/20/15 0932 Last data filed at 07/20/15 0400  Gross per 24 hour  Intake 2312.5 ml  Output    725 ml  Net 1587.5 ml    Exam Awake Alert, Oriented x 2, No new F.N deficits, Normal affect [place].AT,PERRAL Supple Neck,No JVD, No cervical lymphadenopathy appriciated.  Symmetrical Chest wall movement, Good air movement bilaterally, CTAB RRR,No Gallops,Rubs or new Murmurs, No Parasternal Heave +ve B.Sounds, Abd Soft, Non tender, No organomegaly appriciated, No rebound -guarding or rigidity. No Cyanosis, Clubbing or edema, No new Rash or bruise, indwelling foley    Data Review   CBC w Diff:  Lab Results  Component Value Date   WBC 6.7 07/18/2015   HGB 9.7* 07/18/2015   HCT 29.1* 07/18/2015   PLT 142* 07/18/2015   LYMPHOPCT 4* 07/17/2015   MONOPCT 4 07/17/2015   EOSPCT 0 07/17/2015   BASOPCT 0 07/17/2015    CMP:  Lab Results  Component Value Date   NA 140 07/18/2015   K 3.9 07/19/2015   CL 111 07/18/2015   CO2 19* 07/18/2015   BUN 29* 07/18/2015   CREATININE 1.13 07/18/2015   PROT 5.3* 07/17/2015   ALBUMIN 2.7* 07/17/2015   BILITOT 1.0 07/17/2015   ALKPHOS 51 07/17/2015   AST 18 07/17/2015   ALT 8* 07/17/2015  .   Total Time in preparing paper work, data evaluation and todays exam - 35 minutes  Thurnell Lose M.D on 07/20/2015 at 9:32 AM  [facility]   Office  [phone]

## 2015-07-20 NOTE — Progress Notes (Signed)
Speech Language Pathology Treatment: Dysphagia  Patient Details Name: DEON DUER MRN: [id number] DOB: December 25, 1928 Today's Date: 07/20/2015 Time: 2336-1224 SLP Time Calculation (min) (ACUTE ONLY): 14 min  Assessment / Plan / Recommendation Clinical Impression  Patient seen for f/u diet tolerance assessment. Regular daytime caregiver present and supportive. Aware of of diet recommendations following MBS. Educated further regarding rationale for honey thick liquids while training patient on consistent use of chin tuck for possible diet advancement to nectar thick via tsp. Patient able to independently recall chin tuck strategy, required min verbal cues for consistent use during po intake with one episode of decreased airway protection when not used accurately. Overall, current diet appears appropriate. Will continue to f/u.    HPI Other Pertinent Information: Patient is an 81 year old man with a history of multiple admissions over the past 6 months for sepsis, recurrent pneumonia, recurrent urinary tract infections, and worsening functional status. Patient presented to the emergency room on 07/16/2015 with complaints of shortness of breath. His chest x-ray showed a left lower lobe opacity. He was febrile with a temperature of 101.8. He was tachycardic and ultimately converted into an unstable SVT.   Pertinent Vitals Pain Assessment: No/denies pain  SLP Plan  Continue with current plan of care    Recommendations Diet recommendations: Dysphagia 3 (mechanical soft);Honey-thick liquid Liquids provided via: Teaspoon Medication Administration: Crushed with puree Supervision: Full supervision/cueing for compensatory strategies;Patient able to self feed Compensations: Slow rate;Small sips/bites;Chin tuck Postural Changes and/or Swallow Maneuvers: Seated upright 90 degrees;Upright 30-60 min after meal              Oral Care Recommendations: Oral care BID Follow up Recommendations: Skilled  Nursing facility;24 hour supervision/assistance Plan: Continue with current plan of care    [place], [place] [phone]   Gabriel Rainwater Meryl 07/20/2015, 10:56 AM

## 2015-07-20 NOTE — Discharge Instructions (Signed)
Follow with Primary MD in 7 days   Get CBC, CMP, 2 view Chest X ray checked  by Primary MD next visit.    Activity: As tolerated with Full fall precautions use walker/cane & assistance as needed   Disposition SNF   Diet: Dysphagia 3 Honey thick liquids with feeding assistance and aspiration precautions.  For Heart failure patients - Check your Weight same time everyday, if you gain over 2 pounds, or you develop in leg swelling, experience more shortness of breath or chest pain, call your Primary MD immediately. Follow Cardiac Low Salt Diet and 1.5 lit/day fluid restriction.   On your next visit with your primary care physician please Get Medicines reviewed and adjusted.   Please request your Prim.MD to go over all Hospital Tests and Procedure/Radiological results at the follow up, please get all Hospital records sent to your Prim MD by signing hospital release before you go home.   If you experience worsening of your admission symptoms, develop shortness of breath, life threatening emergency, suicidal or homicidal thoughts you must seek medical attention immediately by calling 911 or calling your MD immediately  if symptoms less severe.  You Must read complete instructions/literature along with all the possible adverse reactions/side effects for all the Medicines you take and that have been prescribed to you. Take any new Medicines after you have completely understood and accpet all the possible adverse reactions/side effects.   Do not drive, operating heavy machinery, perform activities at heights, swimming or participation in water activities or provide baby sitting services if your were admitted for syncope or siezures until you have seen by Primary MD or a Neurologist and advised to do so again.  Do not drive when taking Pain medications.    Do not take more than prescribed Pain, Sleep and Anxiety Medications  Special Instructions: If you have smoked or chewed Tobacco  in the  last 2 yrs please stop smoking, stop any regular Alcohol  and or any Recreational drug use.  Wear Seat belts while driving.   Please note  You were cared for by a hospitalist during your hospital stay. If you have any questions about your discharge medications or the care you received while you were in the hospital after you are discharged, you can call the unit and asked to speak with the hospitalist on call if the hospitalist that took care of you is not available. Once you are discharged, your primary care physician will handle any further medical issues. Please note that NO REFILLS for any discharge medications will be authorized once you are discharged, as it is imperative that you return to your primary care physician (or establish a relationship with a primary care physician if you do not have one) for your aftercare needs so that they can reassess your need for medications and monitor your lab values.

## 2015-07-20 NOTE — Discharge Planning (Signed)
Patient discharged to SNF in stable condition.  

## 2015-07-20 NOTE — Progress Notes (Signed)
ANTIBIOTIC CONSULT NOTE - INITIAL  Pharmacy Consult for Augmentin Indication: pneumonia  Allergies  Allergen Reactions  . Dulera [Mometasone Furo-Formoterol Fum] Other (See Comments)    Causes facial/oral/nasal burning progressing to urinary tract blockage  . Pioglitazone Cough  . Ramipril Cough    Patient Measurements: Body Weight: 58.9kg  Vital Signs: Temp: 97.8 F (36.6 C) (09/12 0643) Temp Source: Oral (09/12 0643) BP: 143/59 mmHg (09/12 0643) Pulse Rate: 60 (09/12 0839)  Labs:  Recent Labs  07/18/15 0311  WBC 6.7  HGB 9.7*  PLT 142*  CREATININE 1.13   Estimated Creatinine Clearance: 40.5 mL/min (by C-G formula based on Cr of 1.13).   Microbiology: Recent Results (from the past 720 hour(s))  C difficile quick scan w PCR reflex     Status: None   Collection Time: 06/20/15 11:00 AM  Result Value Ref Range Status   C Diff antigen NEGATIVE NEGATIVE Final   C Diff toxin NEGATIVE NEGATIVE Final   C Diff interpretation Negative for toxigenic C. difficile  Final  Blood culture (routine x 2)     Status: None   Collection Time: 06/30/15  9:08 PM  Result Value Ref Range Status   Specimen Description BLOOD RIGHT ARM  Final   Special Requests BOTTLES DRAWN AEROBIC AND ANAEROBIC 10CC  Final   Culture NO GROWTH 5 DAYS  Final   Report Status 07/05/2015 FINAL  Final  Blood culture (routine x 2)     Status: None   Collection Time: 06/30/15  9:12 PM  Result Value Ref Range Status   Specimen Description BLOOD RIGHT HAND  Final   Special Requests BOTTLES DRAWN AEROBIC AND ANAEROBIC 5CC  Final   Culture NO GROWTH 5 DAYS  Final   Report Status 07/05/2015 FINAL  Final  Urine culture     Status: None   Collection Time: 07/01/15  2:09 PM  Result Value Ref Range Status   Specimen Description URINE, CATHETERIZED  Final   Special Requests NONE  Final   Culture >=100,000 COLONIES/mL YEAST  Final   Report Status 07/03/2015 FINAL  Final  Culture, blood (routine x 2)     Status:  None (Preliminary result)   Collection Time: 07/17/15 12:20 AM  Result Value Ref Range Status   Specimen Description BLOOD LEFT HAND  Final   Special Requests BOTTLES DRAWN AEROBIC ONLY 3CC  Final   Culture NO GROWTH 2 DAYS  Final   Report Status PENDING  Incomplete  Urine culture     Status: None   Collection Time: 07/17/15 12:49 AM  Result Value Ref Range Status   Specimen Description URINE, RANDOM  Final   Special Requests NONE  Final   Culture MULTIPLE SPECIES PRESENT, SUGGEST RECOLLECTION  Final   Report Status 07/18/2015 FINAL  Final  MRSA PCR Screening     Status: None   Collection Time: 07/17/15  6:55 AM  Result Value Ref Range Status   MRSA by PCR NEGATIVE NEGATIVE Final    Comment:        The GeneXpert MRSA Assay (FDA approved for NASAL specimens only), is one component of a comprehensive MRSA colonization surveillance program. It is not intended to diagnose MRSA infection nor to guide or monitor treatment for MRSA infections.    Assessment: 81yo male c/o SOB w/ O2 sats at 88% on 5L at facility, CXR shows pleural effusion and associated atelectasis/PNA and was started on IV abx. Now to switch to Augmentin. SCr 1.13 with est CrCl ~34mL/min.  WBC nml and he has remained afebrile.  Goal of Therapy:  Eradication of infection while dosing appropriately for renal and hepatic function  Plan:  -Augmentin 875/125mg  tablets BID -pharmacy to sign off as no adjustment anticipated. Please call if there are any questions.  Arieal Cuoco D. Nikola Blackston, PharmD, BCPS Clinical Pharmacist Pager: [phone] 07/20/2015 8:49 AM

## 2015-07-20 NOTE — Clinical Social Work Note (Signed)
Patient to be d/c'ed today to [facility].  Patient and family agreeable to plans will transport via ems RN to call report.  Jaileigh Weimer, MSW, LCSWA [phone] 

## 2015-07-20 NOTE — Clinical Social Work Note (Signed)
Clinical Social Work Assessment  Patient Details  Name: [name] MRN: [id number] Date of Birth: 1929-08-07  Date of referral:  07/20/15               Reason for consult:  Facility Placement                Permission sought to share information with:  Customer service manager, Other Permission granted to share information::  Yes, Verbal Permission Granted  Name::     [name]  Agency::  SNF[facility]  Relationship::     Contact Information:     Housing/Transportation Living arrangements for the past 2 months:  [place], [place] of Information:  Patient, Other (Comment Required) (HC POA) Patient Interpreter Needed:  None Criminal Activity/Legal Involvement Pertinent to Current Situation/Hospitalization:  No - Comment as needed Significant Relationships:  Friend Lives with:  Friends Do you feel safe going back to the place where you live?  Yes Need for family participation in patient care:  Yes (Comment)  Care giving concerns:  Patient would like to return back home once he has received some short term rehab at [facility].   Social Worker assessment / plan:  Patient was at a SNF for short term rehab at [facility], and patient and POA would like him to go to [facility].  Patient was alert and oriented x4, patient's POA was at bedside, and is hopeful that his wounds will heal and his strength will return in order to go back home.  Patient did not seem distressed and is in agreement to going to SNF to continue with his rehab.  Employment status:  Disabled (Comment on whether or not currently receiving Disability), Retired Forensic scientist:  Medicare, Programmer, applications PT Recommendations:  [place] / Referral to community resources:  [place]  Patient/Family's Response to care:  Patient's POA would like him to go SNF for short term rehab.   Patient/Family's Understanding of and  Emotional Response to Diagnosis, Current Treatment, and Prognosis: Patient's power of attorney and patient are in agreement to going to SNF for rehab then return home.  Emotional Assessment Appearance:  Appears stated age Attitude/Demeanor/Rapport:  Guarded Affect (typically observed):  Guarded, Constricted Orientation:  Oriented to Self, Oriented to Place, Oriented to  Time, Oriented to Situation Alcohol / Substance use:  Alcohol Use Psych involvement (Current and /or in the community):  No (Comment)  Discharge Needs  Concerns to be addressed:  Discharge Planning Concerns Readmission within the last 30 days:  Yes Current discharge risk:  Chronically ill, Physical Impairment Barriers to Discharge:  No Barriers Identified   Ross Ludwig, LCSWA 07/20/2015, 2:29 PM

## 2015-07-21 ENCOUNTER — Non-Acute Institutional Stay (SKILLED_NURSING_FACILITY): Payer: Medicare Other | Admitting: Adult Health

## 2015-07-21 ENCOUNTER — Encounter: Payer: Self-pay | Admitting: Adult Health

## 2015-07-21 DIAGNOSIS — J438 Other emphysema: Secondary | ICD-10-CM | POA: Diagnosis not present

## 2015-07-21 DIAGNOSIS — R5381 Other malaise: Secondary | ICD-10-CM | POA: Diagnosis not present

## 2015-07-21 DIAGNOSIS — E1165 Type 2 diabetes mellitus with hyperglycemia: Secondary | ICD-10-CM | POA: Diagnosis not present

## 2015-07-21 DIAGNOSIS — Z978 Presence of other specified devices: Secondary | ICD-10-CM

## 2015-07-21 DIAGNOSIS — I471 Supraventricular tachycardia, unspecified: Secondary | ICD-10-CM

## 2015-07-21 DIAGNOSIS — K219 Gastro-esophageal reflux disease without esophagitis: Secondary | ICD-10-CM | POA: Diagnosis not present

## 2015-07-21 DIAGNOSIS — F329 Major depressive disorder, single episode, unspecified: Secondary | ICD-10-CM

## 2015-07-21 DIAGNOSIS — I2510 Atherosclerotic heart disease of native coronary artery without angina pectoris: Secondary | ICD-10-CM

## 2015-07-21 DIAGNOSIS — IMO0002 Reserved for concepts with insufficient information to code with codable children: Secondary | ICD-10-CM

## 2015-07-21 DIAGNOSIS — Z9889 Other specified postprocedural states: Secondary | ICD-10-CM | POA: Diagnosis not present

## 2015-07-21 DIAGNOSIS — Z960 Presence of urogenital implants: Secondary | ICD-10-CM

## 2015-07-21 DIAGNOSIS — J189 Pneumonia, unspecified organism: Secondary | ICD-10-CM | POA: Diagnosis not present

## 2015-07-21 DIAGNOSIS — K5900 Constipation, unspecified: Secondary | ICD-10-CM | POA: Diagnosis not present

## 2015-07-21 DIAGNOSIS — E43 Unspecified severe protein-calorie malnutrition: Secondary | ICD-10-CM | POA: Diagnosis not present

## 2015-07-21 DIAGNOSIS — F32A Depression, unspecified: Secondary | ICD-10-CM
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
## 2015-07-22 LAB — CULTURE, BLOOD (ROUTINE X 2): CULTURE: NO GROWTH

## 2015-07-24 ENCOUNTER — Non-Acute Institutional Stay (SKILLED_NURSING_FACILITY): Payer: Medicare Other | Admitting: Internal Medicine

## 2015-07-24 DIAGNOSIS — E119 Type 2 diabetes mellitus without complications: Secondary | ICD-10-CM

## 2015-07-24 DIAGNOSIS — K219 Gastro-esophageal reflux disease without esophagitis: Secondary | ICD-10-CM | POA: Diagnosis not present

## 2015-07-24 DIAGNOSIS — I471 Supraventricular tachycardia, unspecified: Secondary | ICD-10-CM

## 2015-07-24 DIAGNOSIS — E43 Unspecified severe protein-calorie malnutrition: Secondary | ICD-10-CM | POA: Diagnosis not present

## 2015-07-24 DIAGNOSIS — J189 Pneumonia, unspecified organism: Secondary | ICD-10-CM

## 2015-07-24 DIAGNOSIS — J438 Other emphysema: Secondary | ICD-10-CM

## 2015-07-24 DIAGNOSIS — L89150 Pressure ulcer of sacral region, unstageable: Secondary | ICD-10-CM

## 2015-07-24 DIAGNOSIS — R1310 Dysphagia, unspecified: Secondary | ICD-10-CM | POA: Diagnosis not present

## 2015-07-24 DIAGNOSIS — R5381 Other malaise: Secondary | ICD-10-CM

## 2015-07-24 DIAGNOSIS — K5900 Constipation, unspecified: Secondary | ICD-10-CM

## 2015-07-24 DIAGNOSIS — R339 Retention of urine, unspecified: Secondary | ICD-10-CM

## 2015-07-24 DIAGNOSIS — R4189 Other symptoms and signs involving cognitive functions and awareness: Secondary | ICD-10-CM

## 2015-07-24 DIAGNOSIS — D638 Anemia in other chronic diseases classified elsewhere: Secondary | ICD-10-CM
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
## 2015-08-06 ENCOUNTER — Emergency Department (HOSPITAL_COMMUNITY): Payer: Medicare Other

## 2015-08-06 ENCOUNTER — Inpatient Hospital Stay (HOSPITAL_COMMUNITY): Payer: Medicare Other

## 2015-08-06 ENCOUNTER — Inpatient Hospital Stay (HOSPITAL_COMMUNITY)
Admission: EM | Admit: 2015-08-06 | Discharge: 2015-08-10 | DRG: 698 | Disposition: A | Discharge status: Skilled Nursing Facility | Payer: Medicare Other | Attending: Internal Medicine | Admitting: Internal Medicine

## 2015-08-06 ENCOUNTER — Encounter (HOSPITAL_COMMUNITY): Payer: Self-pay | Admitting: *Deleted

## 2015-08-06 DIAGNOSIS — Z7982 Long term (current) use of aspirin: Secondary | ICD-10-CM | POA: Diagnosis not present

## 2015-08-06 DIAGNOSIS — R109 Unspecified abdominal pain: Secondary | ICD-10-CM

## 2015-08-06 DIAGNOSIS — K219 Gastro-esophageal reflux disease without esophagitis: Secondary | ICD-10-CM | POA: Diagnosis present

## 2015-08-06 DIAGNOSIS — Z800 Family history of malignant neoplasm of digestive organs: Secondary | ICD-10-CM

## 2015-08-06 DIAGNOSIS — I5032 Chronic diastolic (congestive) heart failure: Secondary | ICD-10-CM | POA: Diagnosis present

## 2015-08-06 DIAGNOSIS — B3749 Other urogenital candidiasis: Secondary | ICD-10-CM | POA: Diagnosis present

## 2015-08-06 DIAGNOSIS — Z825 Family history of asthma and other chronic lower respiratory diseases: Secondary | ICD-10-CM | POA: Diagnosis not present

## 2015-08-06 DIAGNOSIS — Z6821 Body mass index (BMI) 21.0-21.9, adult: Secondary | ICD-10-CM

## 2015-08-06 DIAGNOSIS — Z794 Long term (current) use of insulin: Secondary | ICD-10-CM

## 2015-08-06 DIAGNOSIS — E43 Unspecified severe protein-calorie malnutrition: Secondary | ICD-10-CM | POA: Diagnosis present

## 2015-08-06 DIAGNOSIS — IMO0002 Reserved for concepts with insufficient information to code with codable children: Secondary | ICD-10-CM | POA: Diagnosis present

## 2015-08-06 DIAGNOSIS — T83511D Infection and inflammatory reaction due to indwelling urethral catheter, subsequent encounter: Secondary | ICD-10-CM | POA: Diagnosis not present

## 2015-08-06 DIAGNOSIS — Z7902 Long term (current) use of antithrombotics/antiplatelets: Secondary | ICD-10-CM | POA: Diagnosis not present

## 2015-08-06 DIAGNOSIS — E1122 Type 2 diabetes mellitus with diabetic chronic kidney disease: Secondary | ICD-10-CM | POA: Diagnosis present

## 2015-08-06 DIAGNOSIS — M1990 Unspecified osteoarthritis, unspecified site: Secondary | ICD-10-CM | POA: Diagnosis present

## 2015-08-06 DIAGNOSIS — E1165 Type 2 diabetes mellitus with hyperglycemia: Secondary | ICD-10-CM | POA: Diagnosis present

## 2015-08-06 DIAGNOSIS — Z803 Family history of malignant neoplasm of breast: Secondary | ICD-10-CM | POA: Diagnosis not present

## 2015-08-06 DIAGNOSIS — T8351XA Infection and inflammatory reaction due to indwelling urinary catheter, initial encounter: Secondary | ICD-10-CM | POA: Diagnosis not present

## 2015-08-06 DIAGNOSIS — Y846 Urinary catheterization as the cause of abnormal reaction of the patient, or of later complication, without mention of misadventure at the time of the procedure: Secondary | ICD-10-CM | POA: Diagnosis present

## 2015-08-06 DIAGNOSIS — D72819 Decreased white blood cell count, unspecified: Secondary | ICD-10-CM | POA: Diagnosis present

## 2015-08-06 DIAGNOSIS — Z888 Allergy status to other drugs, medicaments and biological substances status: Secondary | ICD-10-CM | POA: Diagnosis not present

## 2015-08-06 DIAGNOSIS — E1151 Type 2 diabetes mellitus with diabetic peripheral angiopathy without gangrene: Secondary | ICD-10-CM | POA: Diagnosis present

## 2015-08-06 DIAGNOSIS — J449 Chronic obstructive pulmonary disease, unspecified: Secondary | ICD-10-CM | POA: Diagnosis present

## 2015-08-06 DIAGNOSIS — J181 Lobar pneumonia, unspecified organism: Secondary | ICD-10-CM

## 2015-08-06 DIAGNOSIS — J438 Other emphysema: Secondary | ICD-10-CM

## 2015-08-06 DIAGNOSIS — A419 Sepsis, unspecified organism: Secondary | ICD-10-CM

## 2015-08-06 DIAGNOSIS — K920 Hematemesis: Secondary | ICD-10-CM | POA: Diagnosis present

## 2015-08-06 DIAGNOSIS — K5900 Constipation, unspecified: Secondary | ICD-10-CM | POA: Diagnosis present

## 2015-08-06 DIAGNOSIS — D696 Thrombocytopenia, unspecified: Secondary | ICD-10-CM | POA: Diagnosis present

## 2015-08-06 DIAGNOSIS — E872 Acidosis, unspecified: Secondary | ICD-10-CM

## 2015-08-06 DIAGNOSIS — N400 Enlarged prostate without lower urinary tract symptoms: Secondary | ICD-10-CM | POA: Diagnosis present

## 2015-08-06 DIAGNOSIS — I252 Old myocardial infarction: Secondary | ICD-10-CM | POA: Diagnosis not present

## 2015-08-06 DIAGNOSIS — Z79891 Long term (current) use of opiate analgesic: Secondary | ICD-10-CM

## 2015-08-06 DIAGNOSIS — Z8249 Family history of ischemic heart disease and other diseases of the circulatory system: Secondary | ICD-10-CM

## 2015-08-06 DIAGNOSIS — I10 Essential (primary) hypertension: Secondary | ICD-10-CM | POA: Diagnosis present

## 2015-08-06 DIAGNOSIS — I130 Hypertensive heart and chronic kidney disease with heart failure and stage 1 through stage 4 chronic kidney disease, or unspecified chronic kidney disease: Secondary | ICD-10-CM | POA: Diagnosis present

## 2015-08-06 DIAGNOSIS — I2510 Atherosclerotic heart disease of native coronary artery without angina pectoris: Secondary | ICD-10-CM | POA: Diagnosis present

## 2015-08-06 DIAGNOSIS — N390 Urinary tract infection, site not specified: Secondary | ICD-10-CM | POA: Diagnosis present

## 2015-08-06 DIAGNOSIS — F329 Major depressive disorder, single episode, unspecified: Secondary | ICD-10-CM | POA: Diagnosis present

## 2015-08-06 DIAGNOSIS — Z85828 Personal history of other malignant neoplasm of skin: Secondary | ICD-10-CM

## 2015-08-06 DIAGNOSIS — Z79899 Other long term (current) drug therapy: Secondary | ICD-10-CM | POA: Diagnosis not present

## 2015-08-06 DIAGNOSIS — Z8546 Personal history of malignant neoplasm of prostate: Secondary | ICD-10-CM | POA: Diagnosis not present

## 2015-08-06 DIAGNOSIS — Z8701 Personal history of pneumonia (recurrent): Secondary | ICD-10-CM

## 2015-08-06 DIAGNOSIS — N183 Chronic kidney disease, stage 3 (moderate): Secondary | ICD-10-CM | POA: Diagnosis present

## 2015-08-06 DIAGNOSIS — Z66 Do not resuscitate: Secondary | ICD-10-CM | POA: Diagnosis present

## 2015-08-06 DIAGNOSIS — N182 Chronic kidney disease, stage 2 (mild): Secondary | ICD-10-CM

## 2015-08-06 DIAGNOSIS — Z7952 Long term (current) use of systemic steroids: Secondary | ICD-10-CM

## 2015-08-06 DIAGNOSIS — T83511A Infection and inflammatory reaction due to indwelling urethral catheter, initial encounter: Principal | ICD-10-CM | POA: Diagnosis present

## 2015-08-06 DIAGNOSIS — D638 Anemia in other chronic diseases classified elsewhere: Secondary | ICD-10-CM | POA: Diagnosis present

## 2015-08-06 LAB — CBC WITH DIFFERENTIAL/PLATELET
BASOS ABS: 0 10*3/uL (ref 0.0–0.1)
BASOS PCT: 0 %
Basophils Absolute: 0 10*3/uL (ref 0.0–0.1)
Basophils Relative: 0 %
EOS ABS: 0 10*3/uL (ref 0.0–0.7)
EOS PCT: 0 %
EOS PCT: 0 %
Eosinophils Absolute: 0 10*3/uL (ref 0.0–0.7)
HCT: 26.8 % — ABNORMAL LOW (ref 39.0–52.0)
HEMATOCRIT: 28.7 % — AB (ref 39.0–52.0)
Hemoglobin: 9.2 g/dL — ABNORMAL LOW (ref 13.0–17.0)
Hemoglobin: 8.7 g/dL — ABNORMAL LOW (ref 13.0–17.0)
LYMPHS ABS: 0.1 10*3/uL — AB (ref 0.7–4.0)
LYMPHS PCT: 3 %
Lymphocytes Relative: 5 %
Lymphs Abs: 0.2 10*3/uL — ABNORMAL LOW (ref 0.7–4.0)
MCH: 27.9 pg (ref 26.0–34.0)
MCH: 28.2 pg (ref 26.0–34.0)
MCHC: 32.1 g/dL (ref 30.0–36.0)
MCHC: 32.5 g/dL (ref 30.0–36.0)
MCV: 87 fL (ref 78.0–100.0)
MCV: 87 fL (ref 78.0–100.0)
MONO ABS: 0 10*3/uL — AB (ref 0.1–1.0)
Monocytes Absolute: 0.1 10*3/uL (ref 0.1–1.0)
Monocytes Relative: 1 %
Monocytes Relative: 3 %
NEUTROS ABS: 4.1 10*3/uL (ref 1.7–7.7)
Neutro Abs: 3.4 10*3/uL (ref 1.7–7.7)
Neutrophils Relative %: 96 %
Neutrophils Relative %: 92 %
PLATELETS: 167 10*3/uL (ref 150–400)
PLATELETS: 173 10*3/uL (ref 150–400)
RBC: 3.3 MIL/uL — AB (ref 4.22–5.81)
RBC: 3.08 MIL/uL — AB (ref 4.22–5.81)
RDW: 16.6 % — AB (ref 11.5–15.5)
RDW: 16.6 % — ABNORMAL HIGH (ref 11.5–15.5)
WBC: 4.2 10*3/uL (ref 4.0–10.5)
WBC: 3.7 10*3/uL — AB (ref 4.0–10.5)

## 2015-08-06 LAB — COMPREHENSIVE METABOLIC PANEL
ALT: 10 U/L — AB (ref 17–63)
ALT: 11 U/L — AB (ref 17–63)
AST: 21 U/L (ref 15–41)
AST: 15 U/L (ref 15–41)
Albumin: 2.8 g/dL — ABNORMAL LOW (ref 3.5–5.0)
Albumin: 2.6 g/dL — ABNORMAL LOW (ref 3.5–5.0)
Alkaline Phosphatase: 45 U/L (ref 38–126)
Alkaline Phosphatase: 42 U/L (ref 38–126)
Anion gap: 6 (ref 5–15)
Anion gap: 4 — ABNORMAL LOW (ref 5–15)
BILIRUBIN TOTAL: 0.4 mg/dL (ref 0.3–1.2)
BUN: 19 mg/dL (ref 6–20)
BUN: 16 mg/dL (ref 6–20)
CALCIUM: 8.2 mg/dL — AB (ref 8.9–10.3)
CHLORIDE: 108 mmol/L (ref 101–111)
CHLORIDE: 107 mmol/L (ref 101–111)
CO2: 28 mmol/L (ref 22–32)
CO2: 28 mmol/L (ref 22–32)
CREATININE: 0.77 mg/dL (ref 0.61–1.24)
CREATININE: 0.64 mg/dL (ref 0.61–1.24)
Calcium: 7.8 mg/dL — ABNORMAL LOW (ref 8.9–10.3)
GFR calc non Af Amer: >60 mL/min (ref >60)
Glucose, Bld: 187 mg/dL — ABNORMAL HIGH (ref 65–99)
Glucose, Bld: 143 mg/dL — ABNORMAL HIGH (ref 65–99)
Potassium: 4 mmol/L (ref 3.5–5.1)
Potassium: 3.6 mmol/L (ref 3.5–5.1)
SODIUM: 139 mmol/L (ref 135–145)
Sodium: 142 mmol/L (ref 135–145)
TOTAL PROTEIN: 5.2 g/dL — AB (ref 6.5–8.1)
Total Bilirubin: 0.4 mg/dL (ref 0.3–1.2)
Total Protein: 5 g/dL — ABNORMAL LOW (ref 6.5–8.1)

## 2015-08-06 LAB — URINE MICROSCOPIC-ADD ON

## 2015-08-06 LAB — URINALYSIS, ROUTINE W REFLEX MICROSCOPIC
BILIRUBIN URINE: NEGATIVE
GLUCOSE, UA: NEGATIVE mg/dL
Ketones, ur: NEGATIVE mg/dL
Nitrite: NEGATIVE
PH: 7 (ref 5.0–8.0)
Protein, ur: 30 mg/dL — AB
UROBILINOGEN UA: 1 mg/dL (ref 0.0–1.0)

## 2015-08-06 LAB — PROTIME-INR
INR: 1.07 (ref 0.00–1.49)
Prothrombin Time: 14.1 seconds (ref 11.6–15.2)

## 2015-08-06 LAB — TSH: TSH: 4.986 u[IU]/mL — AB (ref 0.350–4.500)

## 2015-08-06 LAB — PROCALCITONIN

## 2015-08-06 LAB — APTT: aPTT: 26 seconds (ref 24–37)

## 2015-08-06 LAB — LIPASE, BLOOD: LIPASE: 35 U/L (ref 22–51)

## 2015-08-06 LAB — I-STAT CG4 LACTIC ACID, ED: LACTIC ACID, VENOUS: 2.67 mmol/L — AB (ref 0.5–2.0)

## 2015-08-06 LAB — LACTIC ACID, PLASMA: Lactic Acid, Venous: 0.8 mmol/L (ref 0.5–2.0)

## 2015-08-06 MED ORDER — SODIUM CHLORIDE 0.9 % IV SOLN
INTRAVENOUS | Status: DC
  Administered 2015-08-06: 20:00:00 via INTRAVENOUS

## 2015-08-06 MED ORDER — SODIUM CHLORIDE 0.9 % IV BOLUS (SEPSIS)
1000.0000 mL | INTRAVENOUS | Status: AC
  Administered 2015-08-06: 1000 mL via INTRAVENOUS

## 2015-08-06 MED ORDER — SACCHAROMYCES BOULARDII 250 MG PO CAPS
250.0000 mg | ORAL_CAPSULE | Freq: Two times a day (BID) | ORAL | Status: DC
  Administered 2015-08-06 – 2015-08-10 (×8): 250 mg via ORAL
  Filled 2015-08-06 (×8): qty 1

## 2015-08-06 MED ORDER — FOLIC ACID 1 MG PO TABS
1.0000 mg | ORAL_TABLET | Freq: Every day | ORAL | Status: DC
  Administered 2015-08-07: 1 mg via ORAL
  Filled 2015-08-06: qty 1

## 2015-08-06 MED ORDER — ONDANSETRON HCL 4 MG/2ML IJ SOLN: 4.0000 mg | Freq: Four times a day (QID) | INTRAMUSCULAR | Status: DC | PRN

## 2015-08-06 MED ORDER — INSULIN ASPART 100 UNIT/ML ~~LOC~~ SOLN
0.0000 [IU] | Freq: Three times a day (TID) | SUBCUTANEOUS | Status: DC
Start: 2015-08-07 — End: 2015-08-10
  Administered 2015-08-07: 2 [IU] via SUBCUTANEOUS
  Administered 2015-08-07: 3 [IU] via SUBCUTANEOUS
  Administered 2015-08-08 (×2): 5 [IU] via SUBCUTANEOUS
  Administered 2015-08-09 (×2): 3 [IU] via SUBCUTANEOUS
  Administered 2015-08-10: 5 [IU] via SUBCUTANEOUS

## 2015-08-06 MED ORDER — PANTOPRAZOLE SODIUM 20 MG PO TBEC
20.0000 mg | DELAYED_RELEASE_TABLET | Freq: Every day | ORAL | Status: DC
  Administered 2015-08-07 – 2015-08-10 (×4): 20 mg via ORAL
  Filled 2015-08-06 (×5): qty 1

## 2015-08-06 MED ORDER — DEXTROSE 5 % IV SOLN: 1.0000 g | INTRAVENOUS | Status: DC

## 2015-08-06 MED ORDER — SODIUM CHLORIDE 0.9 % IV BOLUS (SEPSIS)
500.0000 mL | Freq: Once | INTRAVENOUS | Status: AC
  Administered 2015-08-06: 500 mL via INTRAVENOUS

## 2015-08-06 MED ORDER — PROCEL PO POWD: 2.0000 | Freq: Two times a day (BID) | ORAL | Status: DC

## 2015-08-06 MED ORDER — PIPERACILLIN-TAZOBACTAM 3.375 G IVPB 30 MIN: 3.3750 g | Freq: Three times a day (TID) | INTRAVENOUS | Status: DC

## 2015-08-06 MED ORDER — ASPIRIN 81 MG PO CHEW
81.0000 mg | CHEWABLE_TABLET | Freq: Every day | ORAL | Status: DC
  Administered 2015-08-07 – 2015-08-09 (×3): 81 mg via ORAL
  Filled 2015-08-06 (×3): qty 1

## 2015-08-06 MED ORDER — ACETAMINOPHEN 325 MG PO TABS: 325.0000 mg | ORAL_TABLET | Freq: Four times a day (QID) | ORAL | Status: DC | PRN

## 2015-08-06 MED ORDER — BISACODYL 10 MG RE SUPP: 10.0000 mg | RECTAL | Status: DC | PRN

## 2015-08-06 MED ORDER — ADULT MULTIVITAMIN W/MINERALS CH
1.0000 | ORAL_TABLET | Freq: Every day | ORAL | Status: DC
  Administered 2015-08-07: 1 via ORAL
  Filled 2015-08-06: qty 1

## 2015-08-06 MED ORDER — LACTULOSE 10 GM/15ML PO SOLN
10.0000 g | Freq: Two times a day (BID) | ORAL | Status: DC
  Administered 2015-08-06 – 2015-08-10 (×8): 10 g via ORAL
  Filled 2015-08-06: qty 15
  Filled 2015-08-06 (×7): qty 30

## 2015-08-06 MED ORDER — SODIUM CHLORIDE 0.9 % IV SOLN
INTRAVENOUS | Status: DC
  Administered 2015-08-07 (×2): via INTRAVENOUS

## 2015-08-06 MED ORDER — PREDNISONE 20 MG PO TABS: 20.0000 mg | ORAL_TABLET | Freq: Every day | ORAL | Status: DC

## 2015-08-06 MED ORDER — VITAMIN B-1 100 MG PO TABS
100.0000 mg | ORAL_TABLET | Freq: Every day | ORAL | Status: DC
  Administered 2015-08-07: 100 mg via ORAL
  Filled 2015-08-06: qty 1

## 2015-08-06 MED ORDER — ONDANSETRON HCL 4 MG PO TABS: 4.0000 mg | ORAL_TABLET | Freq: Four times a day (QID) | ORAL | Status: DC | PRN

## 2015-08-06 MED ORDER — CEFTRIAXONE SODIUM 1 G IJ SOLR
1.0000 g | Freq: Once | INTRAMUSCULAR | Status: AC
  Administered 2015-08-06: 1 g via INTRAVENOUS
  Filled 2015-08-06: qty 10

## 2015-08-06 MED ORDER — SERTRALINE HCL 50 MG PO TABS
50.0000 mg | ORAL_TABLET | Freq: Every day | ORAL | Status: DC
  Administered 2015-08-06 – 2015-08-09 (×4): 50 mg via ORAL
  Filled 2015-08-06 (×4): qty 1

## 2015-08-06 MED ORDER — POLYETHYLENE GLYCOL 3350 17 G PO PACK
17.0000 g | PACK | Freq: Three times a day (TID) | ORAL | Status: DC | PRN
  Filled 2015-08-06: qty 1

## 2015-08-06 MED ORDER — CLOPIDOGREL BISULFATE 75 MG PO TABS
75.0000 mg | ORAL_TABLET | Freq: Every day | ORAL | Status: DC
  Administered 2015-08-07 – 2015-08-10 (×4): 75 mg via ORAL
  Filled 2015-08-06 (×4): qty 1

## 2015-08-06 MED ORDER — HEPARIN SODIUM (PORCINE) 5000 UNIT/ML IJ SOLN
5000.0000 [IU] | Freq: Three times a day (TID) | INTRAMUSCULAR | Status: DC
  Administered 2015-08-07: 5000 [IU] via SUBCUTANEOUS
  Filled 2015-08-06: qty 1

## 2015-08-06 MED ORDER — TAMSULOSIN HCL 0.4 MG PO CAPS
0.4000 mg | ORAL_CAPSULE | Freq: Every day | ORAL | Status: DC
  Administered 2015-08-07 – 2015-08-10 (×4): 0.4 mg via ORAL
  Filled 2015-08-06 (×4): qty 1

## 2015-08-06 MED ORDER — TRAMADOL HCL 50 MG PO TABS: 50.0000 mg | ORAL_TABLET | Freq: Four times a day (QID) | ORAL | Status: DC | PRN

## 2015-08-06 MED ORDER — PIPERACILLIN-TAZOBACTAM 3.375 G IVPB
3.3750 g | Freq: Three times a day (TID) | INTRAVENOUS | Status: DC
  Administered 2015-08-07 – 2015-08-10 (×11): 3.375 g via INTRAVENOUS
  Filled 2015-08-06 (×10): qty 50

## 2015-08-06 MED ORDER — IPRATROPIUM-ALBUTEROL 0.5-2.5 (3) MG/3ML IN SOLN
3.0000 mL | Freq: Two times a day (BID) | RESPIRATORY_TRACT | Status: DC
  Administered 2015-08-06: 3 mL via RESPIRATORY_TRACT
  Filled 2015-08-06: qty 3

## 2015-08-06 MED ORDER — DOCUSATE SODIUM 100 MG PO CAPS
100.0000 mg | ORAL_CAPSULE | Freq: Two times a day (BID) | ORAL | Status: DC
  Administered 2015-08-06 – 2015-08-10 (×8): 100 mg via ORAL
  Filled 2015-08-06 (×8): qty 1

## 2015-08-06 MED ORDER — BENEPROTEIN PO POWD
1.0000 | Freq: Two times a day (BID) | ORAL | Status: DC
  Administered 2015-08-07: 6 g via ORAL
  Filled 2015-08-06: qty 227

## 2015-08-06 MED ORDER — MORPHINE SULFATE (CONCENTRATE) 10 MG/0.5ML PO SOLN: 10.0000 mg | ORAL | Status: DC | PRN

## 2015-08-06 MED ORDER — RESOURCE THICKENUP CLEAR PO POWD
ORAL | Status: DC | PRN
  Administered 2015-08-06: 19:00:00 via ORAL
  Filled 2015-08-06: qty 125

## 2015-08-06 MED ORDER — IOHEXOL 300 MG/ML  SOLN
100.0000 mL | Freq: Once | INTRAMUSCULAR | Status: AC | PRN
  Administered 2015-08-06: 100 mL via INTRAVENOUS

## 2015-08-06 MED ORDER — MORPHINE SULFATE (PF) 4 MG/ML IV SOLN
4.0000 mg | Freq: Once | INTRAVENOUS | Status: AC
  Administered 2015-08-06: 4 mg via INTRAVENOUS
  Filled 2015-08-06: qty 1

## 2015-08-06 MED ORDER — TIOTROPIUM BROMIDE MONOHYDRATE 18 MCG IN CAPS
18.0000 ug | ORAL_CAPSULE | Freq: Every day | RESPIRATORY_TRACT | Status: DC
  Administered 2015-08-07 – 2015-08-10 (×4): 18 ug via RESPIRATORY_TRACT
  Filled 2015-08-06: qty 5

## 2015-08-06 MED ORDER — METOPROLOL TARTRATE 25 MG PO TABS
12.5000 mg | ORAL_TABLET | Freq: Two times a day (BID) | ORAL | Status: DC
  Administered 2015-08-06 – 2015-08-10 (×8): 12.5 mg via ORAL
  Filled 2015-08-06 (×8): qty 1

## 2015-08-06 MED ORDER — PREDNISONE 5 MG PO TABS: 5.0000 mg | ORAL_TABLET | Freq: Every day | ORAL | Status: DC
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
## 2015-08-06 NOTE — ED Notes (Signed)
Per EMS, has been at [facility] for rehab following bilateral pneumonia. No BM since Saturday. Abdominal pain and distension, palpable firm mass on left. Nausea and vomiting, has vomited some blood.  130/68 94% on 2L HR 66 CBG 170  POA en route. DNR form left at facility.

## 2015-08-06 NOTE — ED Notes (Signed)
Bed: WA06 Expected date:  Expected time:  Means of arrival:  Comments: EMS - FALL 
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
## 2015-08-07 ENCOUNTER — Inpatient Hospital Stay (HOSPITAL_COMMUNITY): Payer: Medicare Other

## 2015-08-07 DIAGNOSIS — E1151 Type 2 diabetes mellitus with diabetic peripheral angiopathy without gangrene: Secondary | ICD-10-CM | POA: Diagnosis present

## 2015-08-07 DIAGNOSIS — D638 Anemia in other chronic diseases classified elsewhere: Secondary | ICD-10-CM

## 2015-08-07 DIAGNOSIS — F329 Major depressive disorder, single episode, unspecified: Secondary | ICD-10-CM

## 2015-08-07 DIAGNOSIS — J449 Chronic obstructive pulmonary disease, unspecified: Secondary | ICD-10-CM

## 2015-08-07 DIAGNOSIS — I2510 Atherosclerotic heart disease of native coronary artery without angina pectoris: Secondary | ICD-10-CM | POA: Diagnosis present

## 2015-08-07 DIAGNOSIS — I10 Essential (primary) hypertension: Secondary | ICD-10-CM

## 2015-08-07 DIAGNOSIS — K219 Gastro-esophageal reflux disease without esophagitis: Secondary | ICD-10-CM | POA: Diagnosis present

## 2015-08-07 DIAGNOSIS — D72819 Decreased white blood cell count, unspecified: Secondary | ICD-10-CM | POA: Diagnosis present

## 2015-08-07 DIAGNOSIS — T83511A Infection and inflammatory reaction due to indwelling urethral catheter, initial encounter: Secondary | ICD-10-CM

## 2015-08-07 DIAGNOSIS — K5900 Constipation, unspecified: Secondary | ICD-10-CM | POA: Diagnosis present

## 2015-08-07 DIAGNOSIS — D696 Thrombocytopenia, unspecified: Secondary | ICD-10-CM | POA: Diagnosis present

## 2015-08-07 DIAGNOSIS — N390 Urinary tract infection, site not specified: Secondary | ICD-10-CM | POA: Diagnosis present

## 2015-08-07 DIAGNOSIS — B3749 Other urogenital candidiasis: Secondary | ICD-10-CM | POA: Diagnosis present

## 2015-08-07 DIAGNOSIS — K5909 Other constipation: Secondary | ICD-10-CM

## 2015-08-07 DIAGNOSIS — T8351XA Infection and inflammatory reaction due to indwelling urinary catheter, initial encounter: Secondary | ICD-10-CM

## 2015-08-07 DIAGNOSIS — N400 Enlarged prostate without lower urinary tract symptoms: Secondary | ICD-10-CM

## 2015-08-07 LAB — GLUCOSE, CAPILLARY
GLUCOSE-CAPILLARY: 134 mg/dL — AB (ref 65–99)
GLUCOSE-CAPILLARY: 154 mg/dL — AB (ref 65–99)
GLUCOSE-CAPILLARY: 204 mg/dL — AB (ref 65–99)
Glucose-Capillary: 98 mg/dL (ref 65–99)
Glucose-Capillary: 136 mg/dL — ABNORMAL HIGH (ref 65–99)

## 2015-08-07 LAB — CBC
HEMATOCRIT: 26.4 % — AB (ref 39.0–52.0)
Hemoglobin: 8.7 g/dL — ABNORMAL LOW (ref 13.0–17.0)
MCH: 28.6 pg (ref 26.0–34.0)
MCHC: 33 g/dL (ref 30.0–36.0)
MCV: 86.8 fL (ref 78.0–100.0)
PLATELETS: 146 10*3/uL — AB (ref 150–400)
RBC: 3.04 MIL/uL — ABNORMAL LOW (ref 4.22–5.81)
RDW: 16.7 % — AB (ref 11.5–15.5)
WBC: 3.5 10*3/uL — AB (ref 4.0–10.5)

## 2015-08-07 LAB — COMPREHENSIVE METABOLIC PANEL
ALBUMIN: 2.5 g/dL — AB (ref 3.5–5.0)
ALT: 9 U/L — ABNORMAL LOW (ref 17–63)
ANION GAP: 5 (ref 5–15)
AST: 13 U/L — AB (ref 15–41)
Alkaline Phosphatase: 41 U/L (ref 38–126)
BUN: 15 mg/dL (ref 6–20)
CHLORIDE: 109 mmol/L (ref 101–111)
CO2: 28 mmol/L (ref 22–32)
Calcium: 7.8 mg/dL — ABNORMAL LOW (ref 8.9–10.3)
Creatinine, Ser: 0.68 mg/dL (ref 0.61–1.24)
GFR calc Af Amer: >60 mL/min (ref >60)
GFR calc non Af Amer: >60 mL/min (ref >60)
GLUCOSE: 121 mg/dL — AB (ref 65–99)
POTASSIUM: 3.7 mmol/L (ref 3.5–5.1)
Sodium: 142 mmol/L (ref 135–145)
TOTAL PROTEIN: 4.8 g/dL — AB (ref 6.5–8.1)

## 2015-08-07 LAB — PROTIME-INR
INR: 1.05 (ref 0.00–1.49)
Prothrombin Time: 13.9 seconds (ref 11.6–15.2)

## 2015-08-07 LAB — MRSA PCR SCREENING: MRSA BY PCR: NEGATIVE

## 2015-08-07 LAB — LACTIC ACID, PLASMA: LACTIC ACID, VENOUS: 1 mmol/L (ref 0.5–2.0)

## 2015-08-07 LAB — APTT: APTT: 28 s (ref 24–37)

## 2015-08-07 MED ORDER — ALBUTEROL SULFATE (2.5 MG/3ML) 0.083% IN NEBU
2.5000 mg | INHALATION_SOLUTION | Freq: Four times a day (QID) | RESPIRATORY_TRACT | Status: DC | PRN
Start: 2015-08-07 — End: 2015-08-07
  Administered 2015-08-07: 2.5 mg via RESPIRATORY_TRACT
  Filled 2015-08-07: qty 3

## 2015-08-07 MED ORDER — BENEPROTEIN PO POWD
1.0000 | Freq: Three times a day (TID) | ORAL | Status: DC
  Filled 2015-08-07: qty 227

## 2015-08-07 MED ORDER — ALBUTEROL SULFATE (2.5 MG/3ML) 0.083% IN NEBU: 2.5000 mg | INHALATION_SOLUTION | RESPIRATORY_TRACT | Status: DC | PRN

## 2015-08-07 MED ORDER — FLUCONAZOLE IN SODIUM CHLORIDE 100-0.9 MG/50ML-% IV SOLN
100.0000 mg | INTRAVENOUS | Status: DC
  Administered 2015-08-07 – 2015-08-10 (×4): 100 mg via INTRAVENOUS
  Filled 2015-08-07 (×4): qty 50

## 2015-08-07 MED ORDER — PREDNISONE 5 MG PO TABS: 25.0000 mg | ORAL_TABLET | Freq: Every day | ORAL | Status: DC

## 2015-08-07 MED ORDER — PREDNISONE 5 MG PO TABS
25.0000 mg | ORAL_TABLET | Freq: Every day | ORAL | Status: DC
  Administered 2015-08-07 – 2015-08-10 (×4): 25 mg via ORAL
  Filled 2015-08-07 (×11): qty 1

## 2015-08-07 MED ORDER — GLUCERNA SHAKE PO LIQD
237.0000 mL | Freq: Three times a day (TID) | ORAL | Status: DC
  Administered 2015-08-07 – 2015-08-10 (×8): 237 mL via ORAL
  Filled 2015-08-07 (×11): qty 237

## 2015-08-07 MED ORDER — ALBUTEROL SULFATE (2.5 MG/3ML) 0.083% IN NEBU
2.5000 mg | INHALATION_SOLUTION | Freq: Two times a day (BID) | RESPIRATORY_TRACT | Status: DC
  Administered 2015-08-07 – 2015-08-10 (×6): 2.5 mg via RESPIRATORY_TRACT
  Filled 2015-08-07 (×6): qty 3

## 2015-08-07 MED ORDER — RESOURCE INSTANT PROTEIN PO PWD PACKET
6.0000 g | Freq: Three times a day (TID) | ORAL | Status: DC
  Administered 2015-08-07 – 2015-08-10 (×7): 6 g via ORAL
  Filled 2015-08-07 (×11): qty 6

## 2015-08-07 NOTE — Progress Notes (Signed)
Patient coughed up bloody sputum. Small clot noted in sputum. Patient with no new complaints. NP on call notified. No new orders placed. Will continue to monitor closely.

## 2015-08-07 NOTE — Plan of Care (Signed)
Problem: Phase I Progression Outcomes Goal: OOB as tolerated unless otherwise ordered Outcome: Not Applicable Date Met:  11/88/67 Patient on bedrest

## 2015-08-07 NOTE — Progress Notes (Addendum)
Patient ID: [name], male   DOB: 1929/02/11, 81 y.o.   MRN: [id number] [facility] PROGRESS NOTE  [name] XNT:[id number] DOB: [birth year] DOA: 08/06/2015 PCP: [name]  Brief narrative:    57 -year-old male with multiple comorbidities including but not limited to coronary artery disease, hypertension, depression, BPH, COPD, recently hospitalized for healthcare associated pneumonia, aspiration and sepsis. Patient presented from skilled nursing facility because of constipation. On admission, patient was however found to have urinary tract infection. He was started on empiric abx while awaiting urine culture results.  Anticipated discharge: awaiting urine culture results. Anticipated discharge over the weekend, by 08/09/2015.  Assessment/Plan:    Principal Problem:   UTI (lower urinary tract infection) secondary to indwelling Foley catheter / yeast UTI - Urinalysis on the admission showed large leukocytes with many bacteria and yeast. Patient was started on empiric Rocephin in ED but this was then changed to Zosyn. We'll add fluconazole because of yeast presence in the urine. - Follow up final urine culture results. - May continue IV fluids, normal saline at 50 mL an hour until by mouth intake improves.  Active Problems: Diabetes mellitus type 2 with peripheral vascular disease, controlled - A1c 07/17/2015 was 6.3 indicating good glycemic control - Continue sliding scale insulin for now  Constipation - Hasn't had bowel movement since the admission. - Continue lactulose, Colace and bisacodyl - Minimize opiates  Coronary artery disease - Continue aspirin and Plavix - No reports of chest pain  Essential hypertension - Continue metoprolol 12.5 mg twice daily.  COPD - Not in acute exacerbation - Continue daily Spiriva - Continue prednisone 25 mg daily  Depression - Stable, does not feel depressed. - Continue Zoloft 50 mg at bedtime  BPH - Continue  Flomax  Mild thrombocytopenia - Probably from subcutaneous heparin. Stopped subcutaneous heparin today and using SCDs for DVT prophylaxis.  Anemia of chronic disease - Likely secondary to multiple comorbidities conditions, poor by mouth intake - Hemoglobin is stable at 8.7. No evidence of gross bleed.  Leukopenia - Unclear etiology unless exacerbated by steroids. No neutropenia.    DVT Prophylaxis  - Stopped heparin because of slight thrombocytopenia. Use SCDs bilaterally.   Code Status: DNR/DNI  Family Communication:  plan of care discussed with the patient's caregiver at the bedside Disposition Plan: To skilled nursing facility likely by 08/09/2015.  IV access:  Peripheral IV  Procedures and diagnostic studies:    Dg Chest 1 View 08/06/2015 1. Bilateral pleural effusions, right greater than left with adjacent compressive atelectasis. 2. Scattered atelectasis or scarring in the perihilar regions. Emphysema. 3. No definite consolidation to suggest pneumonia, however lower lobe pneumonia would be difficult to exclude.   Electronically Signed   By: Jeb Levering M.D.   On: 08/06/2015 20:51   Ct Abdomen Pelvis W Contrast 08/06/2015   1. No definite acute abdominal pelvic findings. 2. Fecal impaction of the rectum. Previously demonstrated rectal wall thickening and surrounding inflammation have improved. No evidence of bowel obstruction. 3. Stable cystic lesions within the pancreatic head and uncinate process, likely postinflammatory. 4. Other abdominal pelvic findings are stable, including the presence of cholelithiasis, multiple renal cysts, a probable partial staghorn renal calculus on the right and diffuse atherosclerosis. 5. Slight interval enlargement of right pleural effusion. Complex left pleural disease is unchanged with apparent posterior extension through the chest wall.   Electronically Signed   By: Richardean Sale M.D.   On: 08/06/2015 18:13    Medical Consultants:  None    Other Consultants:  Physical therapy Nutrition  IAnti-Infectives:   Zosyn 08/06/2015 -->    Leisa Lenz, MD  [facility] Pager [phone]  Time spent in minutes: 25 minutes  If 7PM-7AM, please contact night-coverage [email or web address] Password [email or web address] 08/07/2015, 11:49 AM   LOS: 1 day    HPI/Subjective: No acute overnight events. No reports of pain.   Objective: Filed Vitals:   08/06/15 2243 08/07/15 6712 08/07/15 0818 08/07/15 0909  BP:  131/47    Pulse:  62    Temp:  97.5 F (36.4 C)    TempSrc:  Oral    Resp:  20    Height:      Weight:    63.504 kg (140 lb)  SpO2: 91% 98% 98%     Intake/Output Summary (Last 24 hours) at 08/07/15 1149 Last data filed at 08/07/15 0700  Gross per 24 hour  Intake 465.83 ml  Output    400 ml  Net  65.83 ml    Exam:   General:  Pt is alert,  not in acute distress  Cardiovascular: Regular rate and rhythm, S1/S2 (+)  Respiratory: Clear to auscultation bilaterally, no wheezing, no crackles, no rhonchi  Abdomen: Soft, non tender, non distended, bowel sounds present  Extremities: No edema, pulses DP and PT palpable bilaterally  Neuro: Grossly nonfocal  Data Reviewed: Basic Metabolic Panel:  Recent Labs Lab 08/06/15 1541 08/06/15 2225 08/07/15 0113  NA 142 139 142  K 4.0 3.6 3.7  CL 108 107 109  CO2 28 28 28   GLUCOSE 187* 143* 121*  BUN 19 16 15   CREATININE 0.77 0.64 0.68  CALCIUM 8.2* 7.8* 7.8*   Liver Function Tests:  Recent Labs Lab 08/06/15 1541 08/06/15 2225 08/07/15 0113  AST 21 15 13*  ALT 10* 11* 9*  ALKPHOS 45 42 41  BILITOT 0.4 0.4 <0.1*  PROT 5.2* 5.0* 4.8*  ALBUMIN 2.8* 2.6* 2.5*    Recent Labs Lab 08/06/15 1541  LIPASE 35   No results for input(s): AMMONIA in the last 168 hours. CBC:  Recent Labs Lab 08/06/15 1541 08/06/15 2225 08/07/15 0113  WBC 4.2 3.7* 3.5*  NEUTROABS 4.1 3.4  --   HGB 9.2* 8.7* 8.7*  HCT 28.7* 26.8* 26.4*  MCV 87.0 87.0 86.8  PLT 167 173 146*    Cardiac Enzymes: No results for input(s): CKTOTAL, CKMB, CKMBINDEX, TROPONINI in the last 168 hours. BNP: Invalid input(s): POCBNP CBG:  Recent Labs Lab 08/06/15 2319 08/07/15 0744  GLUCAP 134* 98    Recent Results (from the past 240 hour(s))  MRSA PCR Screening     Status: None   Collection Time: 08/07/15  1:14 AM  Result Value Ref Range Status   MRSA by PCR NEGATIVE NEGATIVE Final     Scheduled Meds: . albuterol  2.5 mg Nebulization BID  . aspirin  81 mg Oral q1800  . clopidogrel  75 mg Oral QAC breakfast  . docusate sodium  100 mg Oral BID  . heparin  5,000 Units Subcutaneous 3 times per day  . insulin aspart  0-15 Units Subcutaneous TID WC  . lactulose  10 g Oral BID  . metoprolol tartrate  12.5 mg Oral BID  . pantoprazole  20 mg Oral Daily  . piperacillin-tazobactam (ZOSYN)  IV  3.375 g Intravenous Q8H  . predniSONE  25 mg Oral Q breakfast  . protein supplement  1 scoop Oral BID WC  . saccharomyces boulardii  250 mg Oral BID  .  sertraline  50 mg Oral QHS  . tamsulosin  0.4 mg Oral Daily  . tiotropium  18 mcg Inhalation Daily   Continuous Infusions: . sodium chloride 50 mL/hr at 08/07/15 0105

## 2015-08-07 NOTE — Clinical Social Work Note (Signed)
Clinical Social Work Assessment  Patient Details  Name: [name] MRN: [id number] Date of Birth: 06-Nov-1929  Date of referral:  08/07/15               Reason for consult:  Discharge Planning                Permission sought to share information with:  Family Supports Permission granted to share information::  Yes, Verbal Permission Granted  Name::     [name]   Agency::     Relationship::  HCPOA  Contact Information:  [phone]  Housing/Transportation Living arrangements for the past 2 months:  [place] of Information:  Power of Attorney Patient Interpreter Needed:  None Criminal Activity/Legal Involvement Pertinent to Current Situation/Hospitalization:  No - Comment as needed Significant Relationships:  Adult Children Lives with:  Other (Comment) (HCPOA) Do you feel safe going back to the place where you live?   (pt HCPOA looking into [facility] at [place], but if pt medically ready from hospital before [employer] can accept then plan will be to return to [facility]) Need for family participation in patient care:  Yes (Comment)  Care giving concerns:  Pt admitted from [facility]. HCPOA concerned about how [place] handled pt medical needs before pt admitted to the hospital.   Social Worker assessment / plan:  CSW received referral that pt admitted from [facility].   CSW visited pt room. Pt has private caregiver at bedside. Pt private caregiver stated pt HCPOA is Anderson Malta and can be reached via telephone.  CSW contacted pt HCPOA, Anderson Malta via telephone. CSW introduced self and explained role. Pt HCPOA confirmed that pt admitted from [facility]. CSW provided support as pt HCPOA discussed that she was concerned about [place] care and did not feel that facility responded to pt needs medically. Pt HCPOA stated that she is in conversation with [name] at [facility], but unsure if placement can be arranged for pt  discharge to [place] from the hospital. [place] is agreeable to pt returning to [facility] if [facility] cannot accept when pt medically stable for discharge.  CSW completed FL2. CSW sent clinical information to [name], but admissions coordinator is off this afternoon and CSW unable to confirm if facility could accept pt or not. CSW notified [place].   CSW sent clinical information to [facility] and confirmed that facility can accept pt back over the weekend if medically stable. HCPOA requested CSW not notify [place] that HCPOA was seeking possible other option since pt may have to return to [facility].  CSW to continue to follow to provide support and assist with pt disposition planning.  Employment status:  Retired Forensic scientist:  Medicare PT Recommendations:  Not assessed at this time [place] / Referral to community resources:  [place]  Patient/Family's Response to care:  Pt alert and oriented x 2. Pt HCPOA actively involved in pt care and strong advocate for pt. Pt HCPOA hopeful that [name] may be able to accept pt, but understands that if [name] is not available when pt medically ready then pt will have to return to [facility]. Pt HCPOA agreeable to this.   Patient/Family's Understanding of and Emotional Response to Diagnosis, Current Treatment, and Prognosis:  Pt HCPOA very knowledgeable about pt diagnosis and treatment plan.   Emotional Assessment Appearance:  Appears stated age Attitude/Demeanor/Rapport:  Other (pt appropriate) Affect (typically observed):  Appropriate Orientation:  Oriented to Self, Oriented  to Place Alcohol / Substance use:  Not Applicable Psych involvement (Current and /or in the community):  No (Comment)  Discharge Needs  Concerns to be addressed:  Discharge Planning Concerns Readmission within the last 30 days:  No Current discharge risk:  Physical Impairment Barriers to Discharge:  Continued Medical Work  up   Ladell Pier, [place] 08/07/2015, 1:14 PM  [phone]

## 2015-08-07 NOTE — Plan of Care (Signed)
Problem: Phase I Progression Outcomes Goal: Voiding-avoid urinary catheter unless indicated Outcome: Not Applicable Date Met:  08/07/15 Patient has chronic foley     

## 2015-08-07 NOTE — Clinical Social Work Placement (Signed)
   CLINICAL SOCIAL WORK PLACEMENT  NOTE  Date:  08/07/2015  Patient Details  Name: ROSHAN ROBACK MRN: [id number] Date of Birth: Nov 30, 1928  Clinical Social Work is seeking post-discharge placement for this patient at the [place] level of care (*CSW will initial, date and re-position this form in  chart as items are completed):  No (pt admitted from [facility]; pt HCPOA talking to [place] about possible placement at [employer], but not yet confirmed. Pt will return to [facility] if [facility] cannot accept pt upon discharge.)   Patient/family provided with [place] Work Department's list of facilities offering this level of care within the geographic area requested by the patient (or if unable, by the patient's family).  Yes   Patient/family informed of their freedom to choose among providers that offer the needed level of care, that participate in Medicare, Medicaid or managed care program needed by the patient, have an available bed and are willing to accept the patient.  No   Patient/family informed of [facility]'s ownership interest in [facility] and [facility], as well as of the fact that they are under no obligation to receive care at these facilities.  PASRR submitted to EDS on       PASRR number received on       Existing PASRR number confirmed on 08/07/15     FL2 transmitted to all facilities in geographic area requested by pt/family on 08/07/15     FL2 transmitted to all facilities within larger geographic area on       Patient informed that his/her managed care company has contracts with or will negotiate with certain facilities, including the following:            Patient/family informed of bed offers received.  Patient chooses bed at       Physician recommends and patient chooses bed at      Patient to be transferred to   on  .  Patient to be transferred to facility by       Patient family notified on   of  transfer.  Name of family member notified:        PHYSICIAN Please sign DNR, Please sign FL2     Additional Comment:   Clinical information sent to [place] at [facility] [facility]. pt admitted from [facility]; pt HCPOA talking to [facility] about possible placement at [facility], but not yet confirmed. Pt will return to [facility] if [name] cannot accept pt upon discharge. _______________________________________________ Ladell Pier, LCSW 08/07/2015, 1:24 PM

## 2015-08-07 NOTE — Progress Notes (Signed)
Initial Nutrition Assessment  DOCUMENTATION CODES:   Severe malnutrition in context of chronic illness  INTERVENTION:  - Will order Glucerna Shake po TID, each supplement provides 220 kcal and 10 grams of protein (will order Resource Thicken Up to be added to this) - Encourage intake of meals and supplements when pt is awake - RD will continue to monitor for needs  NUTRITION DIAGNOSIS:   Malnutrition related to chronic illness as evidenced by moderate depletion of body fat, moderate depletions of muscle mass, severe depletion of muscle mass, severe depletion of body fat.  GOAL:   Patient will meet greater than or equal to 90% of their needs  MONITOR:   PO intake, Supplement acceptance, Weight trends, Labs, Skin, I & O's  REASON FOR ASSESSMENT:   Consult Poor PO  ASSESSMENT:   81 -year-old male with multiple comorbidities including but not limited to coronary artery disease, hypertension, depression, BPH, COPD, recently hospitalized for healthcare associated pneumonia, aspiration and sepsis. Patient presented from skilled nursing facility because of constipation. On admission, patient was however found to have urinary tract infection. He was started on empiric Rocephin while awaiting urine culture results.  Pt seen for consult. BMI indicates normal weight status. Pt is hard of hearing and drowsy and most of the information is obtained from caregiver who is at bedside. PTA pt had a very good appetite and was also drinking Glucerna Shake, which he enjoyed. He did not have any chewing or swallowing difficulties once he had been placed on Soft, Nectar-thick diet PTA. Care giver states that pt drank coffee for breakfast and has otherwise been sleeping today.   Caregiver is unsure of any weight changes PTA. Pt states that his clothes have not felt looser or tighter recently. Per chart review, pt has gained weight over the past month. Physical assessment shows moderate and severe muscle and  moderate and severe fat wasting.   Pt not meeting needs. Medications reviewed. Labs reviewed; CBGs: 96-192 mg/dL, Ca: 7.8 mg/dL.   Diet Order:  DIET SOFT Room service appropriate?: Yes with Assist; Fluid consistency:: Nectar Thick  Skin:   Stage 2 pressure ulcers to bilateral sacrum  Last BM:  9/24  Height:   Ht Readings from Last 1 Encounters:  08/06/15 5\' 8"  (1.727 m)    Weight:   Wt Readings from Last 1 Encounters:  08/07/15 140 lb (63.504 kg)    Ideal Body Weight:  70 kg (kg)  BMI:  Body mass index is 21.29 kg/(m^2).  Estimated Nutritional Needs:   Kcal:  1400-1600  Protein:  60-70 grams  Fluid:  2 L/day  EDUCATION NEEDS:   No education needs identified at this time     Jarome Matin, RD, LDN Inpatient Clinical Dietitian Pager # [phone] After hours/weekend pager # [phone]

## 2015-08-08 DIAGNOSIS — B3749 Other urogenital candidiasis: Secondary | ICD-10-CM

## 2015-08-08 DIAGNOSIS — E43 Unspecified severe protein-calorie malnutrition: Secondary | ICD-10-CM

## 2015-08-08 DIAGNOSIS — T83511D Infection and inflammatory reaction due to indwelling urethral catheter, subsequent encounter: Secondary | ICD-10-CM

## 2015-08-08 LAB — GLUCOSE, CAPILLARY
GLUCOSE-CAPILLARY: 116 mg/dL — AB (ref 65–99)
GLUCOSE-CAPILLARY: 204 mg/dL — AB (ref 65–99)
GLUCOSE-CAPILLARY: 80 mg/dL (ref 65–99)
Glucose-Capillary: 226 mg/dL — ABNORMAL HIGH (ref 65–99)

## 2015-08-08 LAB — HEMOGLOBIN A1C
Hgb A1c MFr Bld: 6.5 % — ABNORMAL HIGH (ref 4.8–5.6)
MEAN PLASMA GLUCOSE: 140 mg/dL

## 2015-08-08 NOTE — Progress Notes (Signed)
Patient ID: [name], male   DOB: 15-Apr-1929, 81 y.o.   MRN: [id number] [facility] PROGRESS NOTE  [name]:[id number] DOB: 11-19-1928 DOA: 08/06/2015 PCP: Blanchie Serve  Brief narrative:    30 -year-old male with multiple comorbidities including but not limited to coronary artery disease, hypertension, depression, BPH, COPD, recently hospitalized for healthcare associated pneumonia, aspiration and sepsis. Patient presented from skilled nursing facility because of constipation. On admission, patient was however found to have urinary tract infection. He was started on empiric Zosyn while awaiting urine culture results.  Anticipated discharge: 10/2 to SNF if final urine culture result available.   Assessment/Plan:    Principal Problem:   UTI (lower urinary tract infection) secondary to indwelling Foley catheter / yeast UTI - UA with large leukocytes, many bacteria and yeast.  - Started zosyn on admission, added diflucan 9/30 - Awaiting final urine culture results   Active Problems: Diabetes mellitus type 2 with peripheral vascular disease, controlled - A1c 07/17/2015 - 6.3 indicating good glycemic control - CBG's in past 24 hours: 204, 116, 226 - Continue sliding scale insulin   Constipation - Had 1 BM in past 24 hours - Continue current bowel regimen.  Coronary artery disease - Continue aspirin and Plavix  Essential hypertension - Continue metoprolol 12.5 mg twice daily. - BP 128/46  COPD - Not in acute exacerbation - Continue daily Spiriva and prednisone 25 mg daily  Depression - Stable, does not feel depressed. - Continue Zoloft 50 mg at bedtime  BPH - Continue Flomax daily   Mild thrombocytopenia - Mild - No bleeding - Using SCD's bilaterally for DVT prophylaxis   Anemia of chronic disease - Secondary to multiple comorbidities conditions and poor by mouth intake - Hemoglobin stable   Leukopenia - Unclear etiology - Repeat CBC  tomorrow am   Severe protein calorie malnutrition - In the context of chronic illness - Seen by nutritionist     DVT Prophylaxis  - Stopped heparin because of slight thrombocytopenia. Use SCDs bilaterally.   Code Status: DNR/DNI  Family Communication:  plan of care discussed with the patient and the patient's caregiver at the bedside Disposition Plan: To skilled nursing facility likely by 08/09/2015.  IV access:  Peripheral IV  Procedures and diagnostic studies:    Dg Chest 1 View 08/06/2015 1. Bilateral pleural effusions, right greater than left with adjacent compressive atelectasis. 2. Scattered atelectasis or scarring in the perihilar regions. Emphysema. 3. No definite consolidation to suggest pneumonia, however lower lobe pneumonia would be difficult to exclude.   Electronically Signed   By: Jeb Levering M.D.   On: 08/06/2015 20:51   Ct Abdomen Pelvis W Contrast 08/06/2015   1. No definite acute abdominal pelvic findings. 2. Fecal impaction of the rectum. Previously demonstrated rectal wall thickening and surrounding inflammation have improved. No evidence of bowel obstruction. 3. Stable cystic lesions within the pancreatic head and uncinate process, likely postinflammatory. 4. Other abdominal pelvic findings are stable, including the presence of cholelithiasis, multiple renal cysts, a probable partial staghorn renal calculus on the right and diffuse atherosclerosis. 5. Slight interval enlargement of right pleural effusion. Complex left pleural disease is unchanged with apparent posterior extension through the chest wall.   Electronically Signed   By: Richardean Sale M.D.   On: 08/06/2015 18:13    Medical Consultants:  None   Other Consultants:  Physical therapy Nutrition  IAnti-Infectives:   Zosyn 08/06/2015 -->  Fluconazole 08/07/2015 -->    Kairi Harshbarger,  MD  [facility] Pager [phone]  Time spent in minutes: 25 minutes  If 7PM-7AM, please contact  night-coverage [email or web address] Password [email or web address] 08/08/2015, 12:51 PM   LOS: 2 days    HPI/Subjective: No acute overnight events. No reports of pain.   Objective: Filed Vitals:   08/07/15 2111 08/08/15 0500 08/08/15 0536 08/08/15 0938  BP: 117/47  159/53 128/46  Pulse: 72  65 72  Temp: 97.6 F (36.4 C)  97.6 F (36.4 C) 97.6 F (36.4 C)  TempSrc: Oral  Oral Oral  Resp: 20  18 18   Height:      Weight:  62.869 kg (138 lb 9.6 oz)    SpO2: 100%  100% 100%    Intake/Output Summary (Last 24 hours) at 08/08/15 1251 Last data filed at 08/08/15 0845  Gross per 24 hour  Intake 1515.83 ml  Output    850 ml  Net 665.83 ml    Exam:   General:  Pt is alert, awake, no acute distress  Cardiovascular: rate controlled,  (+) S1, S2   Respiratory: no wheezing, no crackles, no rhonchi  Abdomen: (+) BS, non tender abdomen   Extremities: No leg swelling, ecchymosis on UE, palpable pulses   Neuro: No focal deficits   Data Reviewed: Basic Metabolic Panel:  Recent Labs Lab 08/06/15 1541 08/06/15 2225 08/07/15 0113  NA 142 139 142  K 4.0 3.6 3.7  CL 108 107 109  CO2 28 28 28   GLUCOSE 187* 143* 121*  BUN 19 16 15   CREATININE 0.77 0.64 0.68  CALCIUM 8.2* 7.8* 7.8*   Liver Function Tests:  Recent Labs Lab 08/06/15 1541 08/06/15 2225 08/07/15 0113  AST 21 15 13*  ALT 10* 11* 9*  ALKPHOS 45 42 41  BILITOT 0.4 0.4 <0.1*  PROT 5.2* 5.0* 4.8*  ALBUMIN 2.8* 2.6* 2.5*    Recent Labs Lab 08/06/15 1541  LIPASE 35   No results for input(s): AMMONIA in the last 168 hours. CBC:  Recent Labs Lab 08/06/15 1541 08/06/15 2225 08/07/15 0113  WBC 4.2 3.7* 3.5*  NEUTROABS 4.1 3.4  --   HGB 9.2* 8.7* 8.7*  HCT 28.7* 26.8* 26.4*  MCV 87.0 87.0 86.8  PLT 167 173 146*   Cardiac Enzymes: No results for input(s): CKTOTAL, CKMB, CKMBINDEX, TROPONINI in the last 168 hours. BNP: Invalid input(s): POCBNP CBG:  Recent Labs Lab 08/07/15 1148 08/07/15 1648 08/07/15 2102  08/08/15 0732 08/08/15 1147  GLUCAP 136* 154* 204* 116* 226*    Recent Results (from the past 240 hour(s))  MRSA PCR Screening     Status: None   Collection Time: 08/07/15  1:14 AM  Result Value Ref Range Status   MRSA by PCR NEGATIVE NEGATIVE Final     Scheduled Meds: . albuterol  2.5 mg Nebulization BID  . aspirin  81 mg Oral q1800  . clopidogrel  75 mg Oral QAC breakfast  . docusate sodium  100 mg Oral BID  . feeding supplement (GLUCERNA SHAKE)  237 mL Oral TID BM  . fluconazole (DIFLUCAN) IV  100 mg Intravenous Q24H  . insulin aspart  0-15 Units Subcutaneous TID WC  . lactulose  10 g Oral BID  . metoprolol tartrate  12.5 mg Oral BID  . pantoprazole  20 mg Oral Daily  . piperacillin-tazobactam (ZOSYN)  IV  3.375 g Intravenous Q8H  . predniSONE  25 mg Oral Q breakfast  . protein supplement  6 g Oral TID WC  . saccharomyces boulardii  250 mg  Oral BID  . sertraline  50 mg Oral QHS  . tamsulosin  0.4 mg Oral Daily  . tiotropium  18 mcg Inhalation Daily   Continuous Infusions: . sodium chloride 50 mL/hr at 08/07/15 2037

## 2015-08-08 NOTE — Clinical Social Work Note (Signed)
CSW called and left message for [name] at [facility] to follow up on bed availability for possible Sunday discharge.  CSW will wait to hear and or will follow up with another phone call to assess for pt SNF bed  .Dede Query, LCSW [facility] Clinical Social Worker - Weekend Coverage cell #: [phone]

## 2015-08-09 LAB — CBC
HCT: 28 % — ABNORMAL LOW (ref 39.0–52.0)
Hemoglobin: 8.9 g/dL — ABNORMAL LOW (ref 13.0–17.0)
MCH: 27.8 pg (ref 26.0–34.0)
MCHC: 31.8 g/dL (ref 30.0–36.0)
MCV: 87.5 fL (ref 78.0–100.0)
PLATELETS: 139 10*3/uL — AB (ref 150–400)
RBC: 3.2 MIL/uL — AB (ref 4.22–5.81)
RDW: 16.9 % — AB (ref 11.5–15.5)
WBC: 2.8 10*3/uL — ABNORMAL LOW (ref 4.0–10.5)

## 2015-08-09 LAB — BASIC METABOLIC PANEL
Anion gap: 5 (ref 5–15)
BUN: 13 mg/dL (ref 6–20)
CALCIUM: 8.1 mg/dL — AB (ref 8.9–10.3)
CO2: 27 mmol/L (ref 22–32)
CREATININE: 0.73 mg/dL (ref 0.61–1.24)
Chloride: 111 mmol/L (ref 101–111)
GFR calc Af Amer: >60 mL/min (ref >60)
Glucose, Bld: 84 mg/dL (ref 65–99)
POTASSIUM: 3.8 mmol/L (ref 3.5–5.1)
SODIUM: 143 mmol/L (ref 135–145)

## 2015-08-09 LAB — GLUCOSE, CAPILLARY
GLUCOSE-CAPILLARY: 81 mg/dL (ref 65–99)
Glucose-Capillary: 155 mg/dL — ABNORMAL HIGH (ref 65–99)
Glucose-Capillary: 184 mg/dL — ABNORMAL HIGH (ref 65–99)
Glucose-Capillary: 128 mg/dL — ABNORMAL HIGH (ref 65–99)

## 2015-08-09 MED ORDER — HYDRALAZINE HCL 20 MG/ML IJ SOLN
10.0000 mg | Freq: Four times a day (QID) | INTRAMUSCULAR | Status: DC | PRN
  Administered 2015-08-09: 10 mg via INTRAVENOUS
  Filled 2015-08-09: qty 1

## 2015-08-09 MED ORDER — RESOURCE INSTANT PROTEIN PO PWD PACKET: 6.0000 g | Freq: Three times a day (TID) | ORAL | Status: AC

## 2015-08-09 MED ORDER — FLUCONAZOLE 100 MG PO TABS: 100.0000 mg | ORAL_TABLET | Freq: Every day | ORAL | Status: DC

## 2015-08-09 MED ORDER — CIPROFLOXACIN HCL 500 MG PO TABS: 500.0000 mg | ORAL_TABLET | Freq: Two times a day (BID) | ORAL | Status: DC

## 2015-08-09 MED ORDER — STARCH (THICKENING) PO POWD
ORAL | Status: DC | PRN
  Filled 2015-08-09: qty 227

## 2015-08-09 MED ORDER — TRAMADOL HCL 50 MG PO TABS: 50.0000 mg | ORAL_TABLET | Freq: Four times a day (QID) | ORAL | Status: DC | PRN

## 2015-08-09 MED ORDER — GLUCERNA SHAKE PO LIQD: 237.0000 mL | Freq: Three times a day (TID) | ORAL | Status: AC

## 2015-08-09 MED ORDER — STARCH (THICKENING) PO POWD: 1.0000 | ORAL | Status: AC | PRN

## 2015-08-09 NOTE — Clinical Social Work Note (Signed)
CSW spoke with [name] to assess for possible SNF bed.  They have no beds this weekend.  CSW called and spoke with pt's POA [name] to provide information regarding [name].  She stated that if pt is ready before [name] has a bed pt can go back to [facility]. She has held the room for [facility] at [facility] although she is not happy with the care he has received there. She is willing to pay private pay at [facility] when they have a bed.  Dede Query, LCSW [place] Worker - Weekend Coverage cell #: [phone]

## 2015-08-09 NOTE — Discharge Instructions (Signed)

## 2015-08-09 NOTE — Evaluation (Signed)
Physical Therapy Evaluation Patient Details Name: [name] MRN: [id number] DOB: 12-28-1928 Today's Date: 08/09/2015   History of Present Illness  81 yo male admitted with UTI. Hx of COPD, DM, CAD. Pt is from SNF  Clinical Impression  Bed level eval; pt agreeable to ROM exercises UE, LE. Pt tolerated activity well. Will follow and progress activity as able/pt tolerates. Recommend return to SNF.     Follow Up Recommendations SNF    Equipment Recommendations  None recommended by PT    Recommendations for Other Services       Precautions / Restrictions Precautions Precautions: Fall Restrictions Weight Bearing Restrictions: No      Mobility  Bed Mobility               General bed mobility comments: Did not perform. On today pt was agreeable to bed level exercises  Transfers                    Ambulation/Gait                Stairs            Wheelchair Mobility    Modified Rankin (Stroke Patients Only)       Balance                                             Pertinent Vitals/Pain Pain Assessment: No/denies pain    Home Living Family/patient expects to be discharged to:: Skilled nursing facility                      Prior Function Level of Independence: Needs assistance   Gait / Transfers Assistance Needed: per chart, pivot to W/C. Pt has been at SNF PTA, varying levels of participation with PT           Hand Dominance        Extremity/Trunk Assessment   Upper Extremity Assessment: Generalized weakness RUE Deficits / Details: Shoulder ROM limited to 90* flexion.  Strength 3-/5     LUE Deficits / Details: Decreased shoulder ROM to < 90*.  Strength grossly 3-/5   Lower Extremity Assessment: Generalized weakness RLE Deficits / Details: Strength grossly at least 2+/5; Decreased hip flexion and rotation. LLE Deficits / Details: Strength grossly at least 2+/5; Decreased hip flexion and  rotation.     Communication      Cognition Arousal/Alertness: Awake/alert Behavior During Therapy: Flat affect Overall Cognitive Status: History of cognitive impairments - at baseline                      General Comments      Exercises General Exercises - Upper Extremity Shoulder Flexion: AAROM;Both;10 reps;Supine Elbow Flexion: AAROM;Both;10 reps;Supine General Exercises - Lower Extremity Ankle Circles/Pumps: AROM;Both;10 reps;Supine Quad Sets: AROM;Both;10 reps;Supine Heel Slides: AAROM;Both;10 reps;Supine      Assessment/Plan    PT Assessment Patient needs continued PT services  PT Diagnosis Difficulty walking;Generalized weakness   PT Problem List Decreased strength;Decreased range of motion;Decreased activity tolerance;Decreased balance;Decreased mobility;Decreased knowledge of use of DME  PT Treatment Interventions Functional mobility training;Therapeutic activities;Therapeutic exercise;Balance training;Patient/family education;DME instruction   PT Goals (Current goals can be found in the Care Plan section) Acute Rehab PT Goals Patient Stated Goal: none stated PT Goal Formulation: Patient unable to participate in goal setting Time  For Goal Achievement: 08/23/15 Potential to Achieve Goals: Poor    Frequency Min 2X/week   Barriers to discharge        Co-evaluation               End of Session   Activity Tolerance: Patient tolerated treatment well Patient left: in bed;with call bell/phone within reach;with bed alarm set;with nursing/sitter in room           Time: 8329-1916 PT Time Calculation (min) (ACUTE ONLY): 13 min   Charges:   PT Evaluation $Initial PT Evaluation Tier I: 1 Procedure     PT G Codes:        Weston Anna, MPT Pager: [phone]

## 2015-08-09 NOTE — Consult Note (Signed)
WOC wound consult note Reason for Consult: Back and perianal area skin breakdown Wound type: Pressure and IAD Pressure Ulcer POA: Yes and Moisture associated skin damage Measurement: Midthoracic:  1.5cm x 0.5cm x 0.2cm with pink, moist wound bed and no drainage.  Perianal:  1cm x 0.5cm x 0.2cm in a region measuring 10cm x 12cm where there has been evidence of previous skin breakdown and wound healing (at risk). Denuded area is with red, moist wound bed, scant serous drainage Wound bed:As described above Drainage (amount, consistency, odor) As described above Periwound:Intact, dry Dressing procedure/placement/frequency: Bilateral heel pressure redistribution boots are provided as well as a pressure redistribution sleep surface with low air loss feature. Nursing is provided with guidance for turning and repositioning and for topical dressings to the two areas of concern today. [place] [facility] nursing team will not follow, but will remain available to this patient, the nursing and medical teams.  Please re-consult if needed. Thanks, Maudie Flakes, MSN, RN, [place], [place], [place] [phone])

## 2015-08-09 NOTE — Progress Notes (Signed)
Patient ID: [name], male   DOB: 08-28-1929, 81 y.o.   MRN: [id number] [facility] PROGRESS NOTE  [name] OEU:[id number] DOB: Oct 29, 1929 DOA: 08/06/2015 PCP: Blanchie Serve  Brief narrative:    36 -year-old male with multiple comorbidities including but not limited to coronary artery disease, hypertension, depression, BPH, COPD, recently hospitalized for healthcare associated pneumonia, aspiration and sepsis. Patient presented from skilled nursing facility because of constipation. On admission, patient was however found to have urinary tract infection. He was started on empiric Zosyn.  Anticipated discharge: to ALF 10/3.  Assessment/Plan:    Principal Problem:   UTI (lower urinary tract infection) secondary to indwelling Foley catheter / yeast UTI - UA on admission showed large leukocytes, many bacteria and yeast.  - Continue zosyn and diflucan  - Unable to locate if urine culture obtained on admission. Will have to treat empirically on discharge   Active Problems: Diabetes mellitus type 2 with peripheral vascular disease, controlled - A1c 07/17/2015 - 6.3 indicating good glycemic control - On SSI.  Constipation - Continue colace and lactulose   Coronary artery disease - Continue aspirin and Plavix - No reports of chest pain   Essential hypertension - Continue metoprolol  COPD - Continue daily Spiriva and prednisone 25 mg daily - Stable respiratory status  Depression - Continue Zoloft 50 mg at bedtime  BPH - Continue Flomax   Mild thrombocytopenia - Mild - No bleeding.   Anemia of chronic disease - Secondary to multiple comorbidities and poor by mouth intake - No reports of bleeding   Leukopenia - Unclear etiology - WBC count ranges 2.8 - 3.5 - Monitor daily CBC  Back and perianal area skin breakdown - WOC assessment appreciated: - Measurement: Midthoracic: 1.5cm x 0.5cm x 0.2cm; Perianal: 1cm x 0.5cm x 0.2cm in a region measuring  10cm x 12cm where there has been evidence of previous skin breakdown and wound healing  - Dressing procedure/placement/frequency: Bilateral heel pressure redistribution boots provided, turning and repositioning   Severe protein calorie malnutrition - In the context of chronic illness - Seen by nutritionist     DVT Prophylaxis  - SCDs bilaterally.   Code Status: DNR/DNI  Family Communication:  plan of care discussed with the patient and the patient's caregiver at the bedside Disposition Plan: To skilled nursing facility likely by 08/10/2015.  IV access:  Peripheral IV  Procedures and diagnostic studies:    Dg Chest 1 View 08/06/2015 1. Bilateral pleural effusions, right greater than left with adjacent compressive atelectasis. 2. Scattered atelectasis or scarring in the perihilar regions. Emphysema. 3. No definite consolidation to suggest pneumonia, however lower lobe pneumonia would be difficult to exclude.   Electronically Signed   By: Jeb Levering M.D.   On: 08/06/2015 20:51   Ct Abdomen Pelvis W Contrast 08/06/2015   1. No definite acute abdominal pelvic findings. 2. Fecal impaction of the rectum. Previously demonstrated rectal wall thickening and surrounding inflammation have improved. No evidence of bowel obstruction. 3. Stable cystic lesions within the pancreatic head and uncinate process, likely postinflammatory. 4. Other abdominal pelvic findings are stable, including the presence of cholelithiasis, multiple renal cysts, a probable partial staghorn renal calculus on the right and diffuse atherosclerosis. 5. Slight interval enlargement of right pleural effusion. Complex left pleural disease is unchanged with apparent posterior extension through the chest wall.   Electronically Signed   By: Richardean Sale M.D.   On: 08/06/2015 18:13    Medical Consultants:  None  Other Consultants:  Physical therapy Nutrition  IAnti-Infectives:   Zosyn 08/06/2015 -->  Fluconazole 08/07/2015  -->    Leisa Lenz, MD  [facility] Pager [phone]  Time spent in minutes: 25 minutes  If 7PM-7AM, please contact night-coverage [email or web address] Password [email or web address] 08/09/2015, 2:28 PM   LOS: 3 days    HPI/Subjective: No acute overnight events. Feels better today.  Objective: Filed Vitals:   08/08/15 2011 08/09/15 0437 08/09/15 0900 08/09/15 1359  BP:  144/74 144/57 137/62  Pulse:  60 60 67  Temp:  97.5 F (36.4 C) 97.5 F (36.4 C) 97.8 F (36.6 C)  TempSrc:  Oral Oral Oral  Resp:  19 19 18   Height:      Weight:  64.683 kg (142 lb 9.6 oz)    SpO2: 98% 98% 92% 100%    Intake/Output Summary (Last 24 hours) at 08/09/15 1428 Last data filed at 08/09/15 1400  Gross per 24 hour  Intake 1752.5 ml  Output   2000 ml  Net -247.5 ml    Exam:   General:  Pt is alert, no acute distress  Cardiovascular: RRR, (+) S1, S2    Respiratory: bilateral air entry, no wheezing   Abdomen: non tender, (+) BS, non distended   Extremities: ecchymosis on UE, no edema   Neuro: Nonfocal    Data Reviewed: Basic Metabolic Panel:  Recent Labs Lab 08/06/15 1541 08/06/15 2225 08/07/15 0113 08/09/15 0512  NA 142 139 142 143  K 4.0 3.6 3.7 3.8  CL 108 107 109 111  CO2 28 28 28 27   GLUCOSE 187* 143* 121* 84  BUN 19 16 15 13   CREATININE 0.77 0.64 0.68 0.73  CALCIUM 8.2* 7.8* 7.8* 8.1*   Liver Function Tests:  Recent Labs Lab 08/06/15 1541 08/06/15 2225 08/07/15 0113  AST 21 15 13*  ALT 10* 11* 9*  ALKPHOS 45 42 41  BILITOT 0.4 0.4 <0.1*  PROT 5.2* 5.0* 4.8*  ALBUMIN 2.8* 2.6* 2.5*    Recent Labs Lab 08/06/15 1541  LIPASE 35   No results for input(s): AMMONIA in the last 168 hours. CBC:  Recent Labs Lab 08/06/15 1541 08/06/15 2225 08/07/15 0113 08/09/15 0512  WBC 4.2 3.7* 3.5* 2.8*  NEUTROABS 4.1 3.4  --   --   HGB 9.2* 8.7* 8.7* 8.9*  HCT 28.7* 26.8* 26.4* 28.0*  MCV 87.0 87.0 86.8 87.5  PLT 167 173 146* 139*   Cardiac Enzymes: No results for  input(s): CKTOTAL, CKMB, CKMBINDEX, TROPONINI in the last 168 hours. BNP: Invalid input(s): POCBNP CBG:  Recent Labs Lab 08/08/15 1147 08/08/15 1641 08/08/15 2105 08/09/15 0738 08/09/15 1146  GLUCAP 226* 204* 80 81 155*    Recent Results (from the past 240 hour(s))  MRSA PCR Screening     Status: None   Collection Time: 08/07/15  1:14 AM  Result Value Ref Range Status   MRSA by PCR NEGATIVE NEGATIVE Final     Scheduled Meds: . albuterol  2.5 mg Nebulization BID  . aspirin  81 mg Oral q1800  . clopidogrel  75 mg Oral QAC breakfast  . docusate sodium  100 mg Oral BID  . feeding supplement (GLUCERNA SHAKE)  237 mL Oral TID BM  . fluconazole (DIFLUCAN) IV  100 mg Intravenous Q24H  . insulin aspart  0-15 Units Subcutaneous TID WC  . lactulose  10 g Oral BID  . metoprolol tartrate  12.5 mg Oral BID  . pantoprazole  20 mg Oral Daily  . piperacillin-tazobactam (  ZOSYN)  IV  3.375 g Intravenous Q8H  . predniSONE  25 mg Oral Q breakfast  . protein supplement  6 g Oral TID WC  . saccharomyces boulardii  250 mg Oral BID  . sertraline  50 mg Oral QHS  . tamsulosin  0.4 mg Oral Daily  . tiotropium  18 mcg Inhalation Daily   Continuous Infusions:

## 2015-08-10 DIAGNOSIS — L909 Atrophic disorder of skin, unspecified: Secondary | ICD-10-CM

## 2015-08-10 LAB — BASIC METABOLIC PANEL
Anion gap: 6 (ref 5–15)
BUN: 11 mg/dL (ref 6–20)
CALCIUM: 7.9 mg/dL — AB (ref 8.9–10.3)
CO2: 24 mmol/L (ref 22–32)
CREATININE: 0.67 mg/dL (ref 0.61–1.24)
Chloride: 109 mmol/L (ref 101–111)
GFR calc Af Amer: >60 mL/min (ref >60)
Glucose, Bld: 105 mg/dL — ABNORMAL HIGH (ref 65–99)
Potassium: 3.2 mmol/L — ABNORMAL LOW (ref 3.5–5.1)
SODIUM: 139 mmol/L (ref 135–145)

## 2015-08-10 LAB — CBC
HCT: 28.9 % — ABNORMAL LOW (ref 39.0–52.0)
Hemoglobin: 9.5 g/dL — ABNORMAL LOW (ref 13.0–17.0)
MCH: 28.3 pg (ref 26.0–34.0)
MCHC: 32.9 g/dL (ref 30.0–36.0)
MCV: 86 fL (ref 78.0–100.0)
PLATELETS: 147 10*3/uL — AB (ref 150–400)
RBC: 3.36 MIL/uL — AB (ref 4.22–5.81)
RDW: 17.3 % — AB (ref 11.5–15.5)
WBC: 3.6 10*3/uL — ABNORMAL LOW (ref 4.0–10.5)

## 2015-08-10 LAB — GLUCOSE, CAPILLARY
GLUCOSE-CAPILLARY: 105 mg/dL — AB (ref 65–99)
GLUCOSE-CAPILLARY: 218 mg/dL — AB (ref 65–99)

## 2015-08-10 MED ORDER — POTASSIUM CHLORIDE CRYS ER 20 MEQ PO TBCR
40.0000 meq | EXTENDED_RELEASE_TABLET | Freq: Once | ORAL | Status: AC
  Administered 2015-08-10: 40 meq via ORAL
  Filled 2015-08-10: qty 2

## 2015-08-10 NOTE — Progress Notes (Signed)
Report given to receiving nurse on [address]. Patient will be transported to room 1336.  Cathie Hoops, RN

## 2015-08-10 NOTE — Progress Notes (Signed)
Pt accepted to [facility]. Patient family, POA Jeniffer Baxter Flattery to complete paperwork by fax. Pt caregiver ware. CSW also called to inform POA, who agreed with dc plan. Pt poa requested copy of dc summary. Transportation arranged for 330 as requested by rn.   Belia Heman, [place] [employer]  [employer] [phone]

## 2015-08-10 NOTE — Discharge Summary (Addendum)
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  GLUCAP 80 81 155* 184* 128*

## 2015-08-11 ENCOUNTER — Emergency Department (HOSPITAL_COMMUNITY): Payer: Medicare Other

## 2015-08-11 ENCOUNTER — Inpatient Hospital Stay (HOSPITAL_COMMUNITY)
Admission: EM | Admit: 2015-08-11 | Discharge: 2015-08-14 | DRG: 698 | Disposition: A | Discharge status: Skilled Nursing Facility | Payer: Medicare Other | Attending: Family Medicine | Admitting: Family Medicine

## 2015-08-11 ENCOUNTER — Encounter (HOSPITAL_COMMUNITY): Payer: Self-pay | Admitting: Emergency Medicine

## 2015-08-11 DIAGNOSIS — Z888 Allergy status to other drugs, medicaments and biological substances status: Secondary | ICD-10-CM

## 2015-08-11 DIAGNOSIS — R590 Localized enlarged lymph nodes: Secondary | ICD-10-CM | POA: Diagnosis present

## 2015-08-11 DIAGNOSIS — I255 Ischemic cardiomyopathy: Secondary | ICD-10-CM | POA: Diagnosis present

## 2015-08-11 DIAGNOSIS — Z800 Family history of malignant neoplasm of digestive organs: Secondary | ICD-10-CM | POA: Diagnosis not present

## 2015-08-11 DIAGNOSIS — F329 Major depressive disorder, single episode, unspecified: Secondary | ICD-10-CM | POA: Diagnosis present

## 2015-08-11 DIAGNOSIS — Z825 Family history of asthma and other chronic lower respiratory diseases: Secondary | ICD-10-CM | POA: Diagnosis not present

## 2015-08-11 DIAGNOSIS — Z85828 Personal history of other malignant neoplasm of skin: Secondary | ICD-10-CM

## 2015-08-11 DIAGNOSIS — K219 Gastro-esophageal reflux disease without esophagitis: Secondary | ICD-10-CM | POA: Diagnosis present

## 2015-08-11 DIAGNOSIS — Z7902 Long term (current) use of antithrombotics/antiplatelets: Secondary | ICD-10-CM | POA: Diagnosis not present

## 2015-08-11 DIAGNOSIS — Z8546 Personal history of malignant neoplasm of prostate: Secondary | ICD-10-CM

## 2015-08-11 DIAGNOSIS — J8410 Pulmonary fibrosis, unspecified: Secondary | ICD-10-CM | POA: Diagnosis present

## 2015-08-11 DIAGNOSIS — B3749 Other urogenital candidiasis: Secondary | ICD-10-CM | POA: Diagnosis present

## 2015-08-11 DIAGNOSIS — I5031 Acute diastolic (congestive) heart failure: Secondary | ICD-10-CM

## 2015-08-11 DIAGNOSIS — E785 Hyperlipidemia, unspecified: Secondary | ICD-10-CM | POA: Diagnosis present

## 2015-08-11 DIAGNOSIS — I110 Hypertensive heart disease with heart failure: Secondary | ICD-10-CM | POA: Diagnosis present

## 2015-08-11 DIAGNOSIS — R0602 Shortness of breath: Secondary | ICD-10-CM

## 2015-08-11 DIAGNOSIS — T83511A Infection and inflammatory reaction due to indwelling urethral catheter, initial encounter: Secondary | ICD-10-CM

## 2015-08-11 DIAGNOSIS — I248 Other forms of acute ischemic heart disease: Secondary | ICD-10-CM | POA: Diagnosis present

## 2015-08-11 DIAGNOSIS — I471 Supraventricular tachycardia: Secondary | ICD-10-CM | POA: Diagnosis present

## 2015-08-11 DIAGNOSIS — Z87440 Personal history of urinary (tract) infections: Secondary | ICD-10-CM | POA: Diagnosis not present

## 2015-08-11 DIAGNOSIS — J449 Chronic obstructive pulmonary disease, unspecified: Secondary | ICD-10-CM | POA: Diagnosis present

## 2015-08-11 DIAGNOSIS — M1990 Unspecified osteoarthritis, unspecified site: Secondary | ICD-10-CM | POA: Diagnosis present

## 2015-08-11 DIAGNOSIS — T83518A Infection and inflammatory reaction due to other urinary catheter, initial encounter: Secondary | ICD-10-CM | POA: Diagnosis not present

## 2015-08-11 DIAGNOSIS — Z7952 Long term (current) use of systemic steroids: Secondary | ICD-10-CM

## 2015-08-11 DIAGNOSIS — D638 Anemia in other chronic diseases classified elsewhere: Secondary | ICD-10-CM | POA: Diagnosis present

## 2015-08-11 DIAGNOSIS — E1151 Type 2 diabetes mellitus with diabetic peripheral angiopathy without gangrene: Secondary | ICD-10-CM | POA: Diagnosis present

## 2015-08-11 DIAGNOSIS — Z79899 Other long term (current) drug therapy: Secondary | ICD-10-CM | POA: Diagnosis not present

## 2015-08-11 DIAGNOSIS — I5033 Acute on chronic diastolic (congestive) heart failure: Secondary | ICD-10-CM | POA: Diagnosis present

## 2015-08-11 DIAGNOSIS — Z8249 Family history of ischemic heart disease and other diseases of the circulatory system: Secondary | ICD-10-CM

## 2015-08-11 DIAGNOSIS — I252 Old myocardial infarction: Secondary | ICD-10-CM | POA: Diagnosis not present

## 2015-08-11 DIAGNOSIS — Z66 Do not resuscitate: Secondary | ICD-10-CM | POA: Diagnosis present

## 2015-08-11 DIAGNOSIS — Z794 Long term (current) use of insulin: Secondary | ICD-10-CM

## 2015-08-11 DIAGNOSIS — N390 Urinary tract infection, site not specified: Secondary | ICD-10-CM | POA: Diagnosis present

## 2015-08-11 DIAGNOSIS — Z803 Family history of malignant neoplasm of breast: Secondary | ICD-10-CM

## 2015-08-11 DIAGNOSIS — I2510 Atherosclerotic heart disease of native coronary artery without angina pectoris: Secondary | ICD-10-CM | POA: Diagnosis present

## 2015-08-11 DIAGNOSIS — R4182 Altered mental status, unspecified: Secondary | ICD-10-CM | POA: Diagnosis not present

## 2015-08-11 DIAGNOSIS — Y846 Urinary catheterization as the cause of abnormal reaction of the patient, or of later complication, without mention of misadventure at the time of the procedure: Secondary | ICD-10-CM | POA: Diagnosis present

## 2015-08-11 DIAGNOSIS — I10 Essential (primary) hypertension: Secondary | ICD-10-CM | POA: Diagnosis not present

## 2015-08-11 DIAGNOSIS — Z8701 Personal history of pneumonia (recurrent): Secondary | ICD-10-CM | POA: Diagnosis not present

## 2015-08-11 DIAGNOSIS — Z7982 Long term (current) use of aspirin: Secondary | ICD-10-CM | POA: Diagnosis not present

## 2015-08-11 DIAGNOSIS — T83511S Infection and inflammatory reaction due to indwelling urethral catheter, sequela: Secondary | ICD-10-CM | POA: Diagnosis not present

## 2015-08-11 LAB — COMPREHENSIVE METABOLIC PANEL
ALK PHOS: 45 U/L (ref 38–126)
ALT: 16 U/L — AB (ref 17–63)
AST: 19 U/L (ref 15–41)
Albumin: 2.8 g/dL — ABNORMAL LOW (ref 3.5–5.0)
Anion gap: 6 (ref 5–15)
BUN: 10 mg/dL (ref 6–20)
CALCIUM: 8.2 mg/dL — AB (ref 8.9–10.3)
CO2: 26 mmol/L (ref 22–32)
CREATININE: 0.71 mg/dL (ref 0.61–1.24)
Chloride: 110 mmol/L (ref 101–111)
Glucose, Bld: 117 mg/dL — ABNORMAL HIGH (ref 65–99)
Potassium: 3.8 mmol/L (ref 3.5–5.1)
Sodium: 142 mmol/L (ref 135–145)
Total Bilirubin: 0.9 mg/dL (ref 0.3–1.2)
Total Protein: 5.3 g/dL — ABNORMAL LOW (ref 6.5–8.1)

## 2015-08-11 LAB — URINE MICROSCOPIC-ADD ON

## 2015-08-11 LAB — CBC WITH DIFFERENTIAL/PLATELET
Basophils Absolute: 0 10*3/uL (ref 0.0–0.1)
Basophils Relative: 0 %
EOS ABS: 0.1 10*3/uL (ref 0.0–0.7)
Eosinophils Relative: 2 %
HEMATOCRIT: 32 % — AB (ref 39.0–52.0)
HEMOGLOBIN: 10.4 g/dL — AB (ref 13.0–17.0)
LYMPHS ABS: 0.2 10*3/uL — AB (ref 0.7–4.0)
Lymphocytes Relative: 6 %
MCH: 28.4 pg (ref 26.0–34.0)
MCHC: 32.5 g/dL (ref 30.0–36.0)
MCV: 87.4 fL (ref 78.0–100.0)
MONO ABS: 0.3 10*3/uL (ref 0.1–1.0)
MONOS PCT: 10 %
NEUTROS PCT: 82 %
Neutro Abs: 2.3 10*3/uL (ref 1.7–7.7)
Platelets: 174 10*3/uL (ref 150–400)
RBC: 3.66 MIL/uL — ABNORMAL LOW (ref 4.22–5.81)
RDW: 17.3 % — ABNORMAL HIGH (ref 11.5–15.5)
WBC: 2.8 10*3/uL — ABNORMAL LOW (ref 4.0–10.5)

## 2015-08-11 LAB — URINALYSIS, ROUTINE W REFLEX MICROSCOPIC
BILIRUBIN URINE: NEGATIVE
Glucose, UA: NEGATIVE mg/dL
Ketones, ur: NEGATIVE mg/dL
Nitrite: NEGATIVE
PROTEIN: 100 mg/dL — AB
Specific Gravity, Urine: 1.011 (ref 1.005–1.030)
UROBILINOGEN UA: 1 mg/dL (ref 0.0–1.0)
pH: 6 (ref 5.0–8.0)

## 2015-08-11 LAB — TROPONIN I: TROPONIN I: 0.04 ng/mL — AB (ref <0.031)

## 2015-08-11 MED ORDER — FENTANYL CITRATE (PF) 100 MCG/2ML IJ SOLN
25.0000 ug | Freq: Once | INTRAMUSCULAR | Status: AC
  Administered 2015-08-11: 25 ug via INTRAVENOUS
  Filled 2015-08-11: qty 2

## 2015-08-11 MED ORDER — DEXTROSE 5 % IV SOLN
1.0000 g | INTRAVENOUS | Status: DC
  Administered 2015-08-11 – 2015-08-13 (×3): 1 g via INTRAVENOUS
  Filled 2015-08-11 (×5): qty 10

## 2015-08-11 MED ORDER — SODIUM CHLORIDE 0.9 % IV SOLN
INTRAVENOUS | Status: DC
  Administered 2015-08-11 – 2015-08-12 (×2): via INTRAVENOUS

## 2015-08-11 MED ORDER — LIDOCAINE-EPINEPHRINE (PF) 2 %-1:200000 IJ SOLN
INTRAMUSCULAR | Status: AC
  Filled 2015-08-11: qty 20

## 2015-08-11 NOTE — ED Notes (Signed)
Nurse drawing labs. 
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
## 2015-08-11 NOTE — ED Notes (Signed)
Pt currently in radiology will complete labs when pt returns.

## 2015-08-11 NOTE — ED Notes (Signed)
Per EMS, patient from [facility]  Patient is being treated for UTI infection.  He was discharged from hospital and went back to facility around 7:30 pm and became very combative.  He refused medication, foods, and oxygen for the past 12 hours.  Patient is a diabetic.    Per EMS, vitals signs are following:  BP: 150/81                           P: 67                           R: 16                           O2: 97% on 2 liter  CBG: 108

## 2015-08-11 NOTE — ED Notes (Signed)
Call from floor and Malachy Mood patient to moved to tele floor

## 2015-08-11 NOTE — ED Notes (Signed)
Called to give report to floor and Malachy Mood stated she would call me back.

## 2015-08-11 NOTE — ED Notes (Signed)
Per Karna Christmas, called Joe to find out the status of moving patient to room 1225
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
## 2015-08-11 NOTE — ED Notes (Signed)
Bed: QA83 Expected date:  Expected time:  Means of arrival:  Comments: EMS-[facility],

## 2015-08-12 DIAGNOSIS — T83511S Infection and inflammatory reaction due to indwelling urethral catheter, sequela: Secondary | ICD-10-CM

## 2015-08-12 DIAGNOSIS — N390 Urinary tract infection, site not specified: Secondary | ICD-10-CM

## 2015-08-12 DIAGNOSIS — I5031 Acute diastolic (congestive) heart failure: Secondary | ICD-10-CM

## 2015-08-12 DIAGNOSIS — T83511A Infection and inflammatory reaction due to indwelling urethral catheter, initial encounter: Secondary | ICD-10-CM

## 2015-08-12 DIAGNOSIS — I2510 Atherosclerotic heart disease of native coronary artery without angina pectoris: Secondary | ICD-10-CM

## 2015-08-12 DIAGNOSIS — I10 Essential (primary) hypertension: Secondary | ICD-10-CM

## 2015-08-12 DIAGNOSIS — J449 Chronic obstructive pulmonary disease, unspecified: Secondary | ICD-10-CM

## 2015-08-12 LAB — CBC WITH DIFFERENTIAL/PLATELET
BASOS PCT: 0 %
Basophils Absolute: 0 10*3/uL (ref 0.0–0.1)
Eosinophils Absolute: 0.1 10*3/uL (ref 0.0–0.7)
Eosinophils Relative: 3 %
HEMATOCRIT: 31 % — AB (ref 39.0–52.0)
HEMOGLOBIN: 10 g/dL — AB (ref 13.0–17.0)
LYMPHS ABS: 0.2 10*3/uL — AB (ref 0.7–4.0)
LYMPHS PCT: 6 %
MCH: 28.4 pg (ref 26.0–34.0)
MCHC: 32.3 g/dL (ref 30.0–36.0)
MCV: 88.1 fL (ref 78.0–100.0)
MONOS PCT: 13 %
Monocytes Absolute: 0.4 10*3/uL (ref 0.1–1.0)
NEUTROS ABS: 2.5 10*3/uL (ref 1.7–7.7)
NEUTROS PCT: 78 %
Platelets: 167 10*3/uL (ref 150–400)
RBC: 3.52 MIL/uL — ABNORMAL LOW (ref 4.22–5.81)
RDW: 17.4 % — ABNORMAL HIGH (ref 11.5–15.5)
WBC: 3.2 10*3/uL — ABNORMAL LOW (ref 4.0–10.5)

## 2015-08-12 LAB — CULTURE, BLOOD (ROUTINE X 2)
CULTURE: NO GROWTH
Culture: NO GROWTH

## 2015-08-12 LAB — COMPREHENSIVE METABOLIC PANEL
ALT: 15 U/L — AB (ref 17–63)
AST: 16 U/L (ref 15–41)
Albumin: 2.6 g/dL — ABNORMAL LOW (ref 3.5–5.0)
Alkaline Phosphatase: 43 U/L (ref 38–126)
Anion gap: 6 (ref 5–15)
BUN: 8 mg/dL (ref 6–20)
CHLORIDE: 110 mmol/L (ref 101–111)
CO2: 28 mmol/L (ref 22–32)
CREATININE: 0.63 mg/dL (ref 0.61–1.24)
Calcium: 8.1 mg/dL — ABNORMAL LOW (ref 8.9–10.3)
GFR calc Af Amer: >60 mL/min (ref >60)
Glucose, Bld: 89 mg/dL (ref 65–99)
Potassium: 3.8 mmol/L (ref 3.5–5.1)
Sodium: 144 mmol/L (ref 135–145)
Total Bilirubin: 0.6 mg/dL (ref 0.3–1.2)
Total Protein: 5.1 g/dL — ABNORMAL LOW (ref 6.5–8.1)

## 2015-08-12 LAB — GLUCOSE, CAPILLARY
GLUCOSE-CAPILLARY: 214 mg/dL — AB (ref 65–99)
Glucose-Capillary: 76 mg/dL (ref 65–99)
Glucose-Capillary: 226 mg/dL — ABNORMAL HIGH (ref 65–99)

## 2015-08-12 LAB — BRAIN NATRIURETIC PEPTIDE: B Natriuretic Peptide: 1246 pg/mL — ABNORMAL HIGH (ref 0.0–100.0)

## 2015-08-12 LAB — TROPONIN I
Troponin I: 0.06 ng/mL — ABNORMAL HIGH (ref <0.031)
Troponin I: 0.06 ng/mL — ABNORMAL HIGH (ref <0.031)
Troponin I: 0.06 ng/mL — ABNORMAL HIGH (ref <0.031)

## 2015-08-12 LAB — MRSA PCR SCREENING: MRSA by PCR: NEGATIVE

## 2015-08-12 LAB — MAGNESIUM
MAGNESIUM: 1.9 mg/dL (ref 1.7–2.4)
Magnesium: 2 mg/dL (ref 1.7–2.4)

## 2015-08-12 LAB — PHOSPHORUS
Phosphorus: 3.3 mg/dL (ref 2.5–4.6)
Phosphorus: 3 mg/dL (ref 2.5–4.6)

## 2015-08-12 MED ORDER — CETYLPYRIDINIUM CHLORIDE 0.05 % MT LIQD
7.0000 mL | Freq: Two times a day (BID) | OROMUCOSAL | Status: DC
  Administered 2015-08-12 – 2015-08-14 (×5): 7 mL via OROMUCOSAL

## 2015-08-12 MED ORDER — TIOTROPIUM BROMIDE MONOHYDRATE 18 MCG IN CAPS: 18.0000 ug | ORAL_CAPSULE | Freq: Every day | RESPIRATORY_TRACT | Status: DC

## 2015-08-12 MED ORDER — STARCH (THICKENING) PO POWD
1.0000 | ORAL | Status: DC | PRN
  Filled 2015-08-12: qty 227

## 2015-08-12 MED ORDER — ALBUTEROL SULFATE (2.5 MG/3ML) 0.083% IN NEBU: 2.5000 mg | INHALATION_SOLUTION | RESPIRATORY_TRACT | Status: DC | PRN

## 2015-08-12 MED ORDER — RESOURCE THICKENUP CLEAR PO POWD
1.0000 | ORAL | Status: DC | PRN
  Filled 2015-08-12: qty 125

## 2015-08-12 MED ORDER — TAMSULOSIN HCL 0.4 MG PO CAPS
0.4000 mg | ORAL_CAPSULE | Freq: Every day | ORAL | Status: DC
  Administered 2015-08-12 – 2015-08-14 (×3): 0.4 mg via ORAL
  Filled 2015-08-12 (×3): qty 1

## 2015-08-12 MED ORDER — INSULIN ASPART 100 UNIT/ML ~~LOC~~ SOLN
0.0000 [IU] | Freq: Three times a day (TID) | SUBCUTANEOUS | Status: DC
  Administered 2015-08-12 (×2): 3 [IU] via SUBCUTANEOUS
  Administered 2015-08-13: 1 [IU] via SUBCUTANEOUS
  Administered 2015-08-13 (×2): 5 [IU] via SUBCUTANEOUS
  Administered 2015-08-14: 1 [IU] via SUBCUTANEOUS
  Administered 2015-08-14: 3 [IU] via SUBCUTANEOUS

## 2015-08-12 MED ORDER — PROCEL PO POWD: 2.0000 | Freq: Two times a day (BID) | ORAL | Status: DC

## 2015-08-12 MED ORDER — GLUCERNA SHAKE PO LIQD
237.0000 mL | Freq: Three times a day (TID) | ORAL | Status: DC
  Administered 2015-08-12 – 2015-08-14 (×6): 237 mL via ORAL
  Filled 2015-08-12 (×9): qty 237

## 2015-08-12 MED ORDER — ADULT MULTIVITAMIN W/MINERALS CH
1.0000 | ORAL_TABLET | Freq: Every day | ORAL | Status: DC
  Administered 2015-08-12 – 2015-08-14 (×3): 1 via ORAL
  Filled 2015-08-12 (×3): qty 1

## 2015-08-12 MED ORDER — PREDNISONE 5 MG PO TABS
25.0000 mg | ORAL_TABLET | Freq: Every day | ORAL | Status: DC
  Administered 2015-08-12 – 2015-08-14 (×3): 25 mg via ORAL
  Filled 2015-08-12 (×6): qty 1

## 2015-08-12 MED ORDER — SERTRALINE HCL 50 MG PO TABS
50.0000 mg | ORAL_TABLET | Freq: Every day | ORAL | Status: DC
  Administered 2015-08-12 – 2015-08-13 (×2): 50 mg via ORAL
  Filled 2015-08-12 (×2): qty 1

## 2015-08-12 MED ORDER — ACETAMINOPHEN 325 MG PO TABS
650.0000 mg | ORAL_TABLET | Freq: Four times a day (QID) | ORAL | Status: DC | PRN
Start: 2015-08-12 — End: 2015-08-14
  Administered 2015-08-12: 650 mg via ORAL
  Filled 2015-08-12: qty 2

## 2015-08-12 MED ORDER — CLOPIDOGREL BISULFATE 75 MG PO TABS
75.0000 mg | ORAL_TABLET | Freq: Every day | ORAL | Status: DC
  Administered 2015-08-12 – 2015-08-14 (×3): 75 mg via ORAL
  Filled 2015-08-12 (×3): qty 1

## 2015-08-12 MED ORDER — SACCHAROMYCES BOULARDII 250 MG PO CAPS
250.0000 mg | ORAL_CAPSULE | Freq: Two times a day (BID) | ORAL | Status: DC
  Administered 2015-08-12 – 2015-08-14 (×5): 250 mg via ORAL
  Filled 2015-08-12 (×5): qty 1

## 2015-08-12 MED ORDER — ASPIRIN 81 MG PO CHEW
81.0000 mg | CHEWABLE_TABLET | Freq: Every day | ORAL | Status: DC
  Administered 2015-08-12 – 2015-08-13 (×2): 81 mg via ORAL
  Filled 2015-08-12 (×2): qty 1

## 2015-08-12 MED ORDER — IPRATROPIUM-ALBUTEROL 0.5-2.5 (3) MG/3ML IN SOLN
3.0000 mL | Freq: Two times a day (BID) | RESPIRATORY_TRACT | Status: DC
  Administered 2015-08-12: 3 mL via RESPIRATORY_TRACT
  Filled 2015-08-12: qty 3

## 2015-08-12 MED ORDER — IPRATROPIUM-ALBUTEROL 0.5-2.5 (3) MG/3ML IN SOLN
3.0000 mL | Freq: Four times a day (QID) | RESPIRATORY_TRACT | Status: DC
  Administered 2015-08-12 – 2015-08-13 (×4): 3 mL via RESPIRATORY_TRACT
  Filled 2015-08-12 (×4): qty 3

## 2015-08-12 MED ORDER — LACTULOSE 10 GM/15ML PO SOLN
10.0000 g | Freq: Two times a day (BID) | ORAL | Status: DC
  Administered 2015-08-12 – 2015-08-13 (×4): 10 g via ORAL
  Filled 2015-08-12 (×5): qty 30

## 2015-08-12 MED ORDER — RESOURCE INSTANT PROTEIN PO PWD PACKET
6.0000 g | Freq: Three times a day (TID) | ORAL | Status: DC
  Administered 2015-08-12 – 2015-08-14 (×7): 6 g via ORAL
  Filled 2015-08-12 (×10): qty 6

## 2015-08-12 MED ORDER — ENOXAPARIN SODIUM 40 MG/0.4ML ~~LOC~~ SOLN
40.0000 mg | SUBCUTANEOUS | Status: DC
  Administered 2015-08-12 – 2015-08-14 (×3): 40 mg via SUBCUTANEOUS
  Filled 2015-08-12 (×3): qty 0.4

## 2015-08-12 MED ORDER — PANTOPRAZOLE SODIUM 40 MG PO TBEC
40.0000 mg | DELAYED_RELEASE_TABLET | Freq: Every day | ORAL | Status: DC
  Administered 2015-08-12 – 2015-08-14 (×3): 40 mg via ORAL
  Filled 2015-08-12 (×4): qty 1

## 2015-08-12 MED ORDER — BISACODYL 10 MG RE SUPP: 10.0000 mg | RECTAL | Status: DC | PRN

## 2015-08-12 MED ORDER — FLUCONAZOLE 100 MG PO TABS
100.0000 mg | ORAL_TABLET | Freq: Every day | ORAL | Status: DC
  Administered 2015-08-12 – 2015-08-14 (×3): 100 mg via ORAL
  Filled 2015-08-12 (×3): qty 1

## 2015-08-12 MED ORDER — DOCUSATE SODIUM 100 MG PO CAPS
100.0000 mg | ORAL_CAPSULE | Freq: Two times a day (BID) | ORAL | Status: DC
  Administered 2015-08-12 – 2015-08-13 (×3): 100 mg via ORAL
  Filled 2015-08-12 (×5): qty 1

## 2015-08-12 MED ORDER — FUROSEMIDE 10 MG/ML IJ SOLN
20.0000 mg | Freq: Two times a day (BID) | INTRAMUSCULAR | Status: DC
  Administered 2015-08-12 – 2015-08-14 (×5): 20 mg via INTRAVENOUS
  Filled 2015-08-12 (×5): qty 2

## 2015-08-12 MED ORDER — POTASSIUM CHLORIDE IN NACL 20-0.45 MEQ/L-% IV SOLN
INTRAVENOUS | Status: DC
  Administered 2015-08-12: 01:00:00 via INTRAVENOUS
  Filled 2015-08-12 (×2): qty 1000

## 2015-08-12 MED ORDER — METOPROLOL TARTRATE 25 MG PO TABS
25.0000 mg | ORAL_TABLET | Freq: Two times a day (BID) | ORAL | Status: DC
  Administered 2015-08-12 – 2015-08-14 (×5): 25 mg via ORAL
  Filled 2015-08-12 (×5): qty 1

## 2015-08-12 NOTE — Progress Notes (Signed)
[facility] PROGRESS NOTE  IVA POSTEN JGO:[id number] DOB: September 05, 1929 DOA: 08/11/2015 PCP: No primary care provider on file.  Assessment/Plan: 1. Acute on chronic diastolic CHF- patient has history of diastolic CHF echo in May 5597 showing grade 1 diastolic dysfunction. I checked BNP this morning which was significantly elevated 1246. We'll start the patient on Lasix 20 mg IV every 12 hours. Strict I's and O's. 2. Elevated troponin- patient had mild elevation of troponin 0.06, likely from above from demand ischemia. He denies any chest pain. EKG showed normal sinus rhythm. 3. Yeast UTI- patient has chronic indwelling Foley catheter, continue oral Fluconazole and ceftriaxone which was started in the previous admission. 4. COPD- stable, continue prednisone 25 mg daily, DuoNeb nebulizers every 6 hours 5. Diabetes mellitus- continue sliding scale insulin with NovoLog 6. Hypertension- continue metoprolol 7. CAD- stable continue aspirin and Plavix 8. Depression- and Zoloft 9. DVT prophylaxis- Lovenox  Code Status: DNR Family Communication: Discussed with caregivers at bedside Disposition Plan: Skilled nursing facility when medically stable   Consultants:  None  Procedures:  None  Antibiotics:  None  HPI/Subjective: 81 year old male with a history of COPD, CAD, GERD, chronic diastolic CHF, hypertension who was discharged from the hospital on 08/10/2015 after he was treated for pneumonia and UTI. Was brought to the hospital for change in mental status with agitation. UA was abnormal and showed yeast UTI.  Patient short of breath this morning, requiring 2 L oxygen via nasal cannula.   Objective: Filed Vitals:   08/12/15 1405  BP: 123/67  Pulse: 65  Temp: 97.6 F (36.4 C)  Resp: 18    Intake/Output Summary (Last 24 hours) at 08/12/15 1727 Last data filed at 08/12/15 1422  Gross per 24 hour  Intake 691.25 ml  Output   2125 ml  Net -1433.75 ml   Filed Weights    08/11/15 2309  Weight: 58 kg (127 lb 13.9 oz)    Exam:   General:  Appears in mild respiratory distress  Cardiovascular: S1-S2 regular  Respiratory: Decreased breath sounds bilaterally at lung bases  Abdomen: Soft, nontender, no organomegaly  Musculoskeletal: No edema noted in the lower extremities, no cyanosis or clubbing  Data Reviewed: Basic Metabolic Panel:  Recent Labs Lab 08/07/15 0113 08/09/15 0512 08/10/15 0710 08/11/15 1627 08/12/15 0015 08/12/15 0505  NA 142 143 139 142  --  144  K 3.7 3.8 3.2* 3.8  --  3.8  CL 109 111 109 110  --  110  CO2 28 27 24 26   --  28  GLUCOSE 121* 84 105* 117*  --  89  BUN 15 13 11 10   --  8  CREATININE 0.68 0.73 0.67 0.71  --  0.63  CALCIUM 7.8* 8.1* 7.9* 8.2*  --  8.1*  MG  --   --   --   --  2.0 1.9  PHOS  --   --   --   --  3.3 3.0   Liver Function Tests:  Recent Labs Lab 08/06/15 1541 08/06/15 2225 08/07/15 0113 08/11/15 1627 08/12/15 0505  AST 21 15 13* 19 16  ALT 10* 11* 9* 16* 15*  ALKPHOS 45 42 41 45 43  BILITOT 0.4 0.4 <0.1* 0.9 0.6  PROT 5.2* 5.0* 4.8* 5.3* 5.1*  ALBUMIN 2.8* 2.6* 2.5* 2.8* 2.6*    Recent Labs Lab 08/06/15 1541  LIPASE 35   No results for input(s): AMMONIA in the last 168 hours. CBC:  Recent Labs Lab 08/06/15 1541  08/06/15 2225 08/07/15 0113 08/09/15 0512 08/10/15 0710 08/11/15 1627 08/12/15 0505  WBC 4.2 3.7* 3.5* 2.8* 3.6* 2.8* 3.2*  NEUTROABS 4.1 3.4  --   --   --  2.3 2.5  HGB 9.2* 8.7* 8.7* 8.9* 9.5* 10.4* 10.0*  HCT 28.7* 26.8* 26.4* 28.0* 28.9* 32.0* 31.0*  MCV 87.0 87.0 86.8 87.5 86.0 87.4 88.1  PLT 167 173 146* 139* 147* 174 167   Cardiac Enzymes:  Recent Labs Lab 08/11/15 1627 08/12/15 0015 08/12/15 0505 08/12/15 1128  TROPONINI 0.04* 0.06* 0.06* 0.06*   BNP (last 3 results)  Recent Labs  04/28/15 1502 07/01/15 0121 08/12/15 1128  BNP 304.9* 344.9* 1246.0*    ProBNP (last 3 results) No results for input(s): PROBNP in the last 8760  hours.  CBG:  Recent Labs Lab 08/09/15 2208 08/10/15 0819 08/10/15 1155 08/12/15 0728 08/12/15 1224  GLUCAP 128* 105* 218* 76 214*    Recent Results (from the past 240 hour(s))  Culture, blood (x 2)     Status: None   Collection Time: 08/06/15 10:05 PM  Result Value Ref Range Status   Specimen Description BLOOD RIGHT HAND  Final   Special Requests BOTTLES DRAWN AEROBIC ONLY 4CC  Final   Culture   Final    NO GROWTH 5 DAYS Performed at [facility]    Report Status 08/12/2015 FINAL  Final  Culture, blood (x 2)     Status: None   Collection Time: 08/06/15 10:15 PM  Result Value Ref Range Status   Specimen Description BLOOD RIGHT ANTECUBITAL  Final   Special Requests BOTTLES DRAWN AEROBIC AND ANAEROBIC 5CC  Final   Culture   Final    NO GROWTH 5 DAYS Performed at [facility]    Report Status 08/12/2015 FINAL  Final  MRSA PCR Screening     Status: None   Collection Time: 08/07/15  1:14 AM  Result Value Ref Range Status   MRSA by PCR NEGATIVE NEGATIVE Final    Comment:        The GeneXpert MRSA Assay (FDA approved for NASAL specimens only), is one component of a comprehensive MRSA colonization surveillance program. It is not intended to diagnose MRSA infection nor to guide or monitor treatment for MRSA infections.   Urine culture     Status: None (Preliminary result)   Collection Time: 08/11/15  4:52 PM  Result Value Ref Range Status   Specimen Description URINE, CLEAN CATCH  Final   Special Requests NONE  Final   Culture   Final    CULTURE REINCUBATED FOR BETTER GROWTH Performed at [facility]    Report Status PENDING  Incomplete  MRSA PCR Screening     Status: None   Collection Time: 08/12/15 12:26 AM  Result Value Ref Range Status   MRSA by PCR NEGATIVE NEGATIVE Final    Comment:        The GeneXpert MRSA Assay (FDA approved for NASAL specimens only), is one component of a comprehensive MRSA colonization surveillance program.  It is not intended to diagnose MRSA infection nor to guide or monitor treatment for MRSA infections.      Studies: Dg Chest 2 View  08/11/2015   CLINICAL DATA:  UTI infection.  Shortness of breath.  EXAM: CHEST  2 VIEW  COMPARISON:  08/03/2015  FINDINGS: There is a normal heart size. Small bilateral pleural effusions identified. There is mild interstitial edema identified. Atelectasis noted within both mid lung regions and both lower  lobes. Numerous calcified mediastinal and hilar lymph nodes identified.  IMPRESSION: 1. Bilateral pleural effusions 2. Bibasilar atelectasis 3. Prior granulomatous disease.   Electronically Signed   By: Kerby Moors M.D.   On: 08/11/2015 16:39   Ct Head Wo Contrast  08/11/2015   CLINICAL DATA:  Altered mental status and combative.  EXAM: CT HEAD WITHOUT CONTRAST  TECHNIQUE: Contiguous axial images were obtained from the base of the skull through the vertex without contrast.  COMPARISON:  03/05/2012  FINDINGS: There is diffuse cerebral atrophy which is unchanged. Again noted is low-density in the periventricular white matter suggesting chronic changes. No evidence for acute hemorrhage, mass lesion, midline shift, hydrocephalus or large infarct. Mild encephalomalacia in the left occipital lobe is unchanged. Question old small infarct in the right cerebellar hemisphere which is unchanged. Mild mucosal disease in the maxillary sinuses. No acute bone abnormality.  IMPRESSION: No acute intracranial abnormality.  Stable atrophy and evidence for chronic small vessel ischemic disease.   Electronically Signed   By: Markus Daft M.D.   On: 08/11/2015 16:36    Scheduled Meds: . antiseptic oral rinse  7 mL Mouth Rinse BID  . aspirin  81 mg Oral q1800  . cefTRIAXone (ROCEPHIN)  IV  1 g Intravenous Q24H  . clopidogrel  75 mg Oral Daily  . docusate sodium  100 mg Oral BID  . enoxaparin (LOVENOX) injection  40 mg Subcutaneous Q24H  . feeding supplement (GLUCERNA SHAKE)  237 mL  Oral TID BM  . fluconazole  100 mg Oral Daily  . furosemide  20 mg Intravenous BID  . insulin aspart  0-9 Units Subcutaneous TID WC  . ipratropium-albuterol  3 mL Nebulization BID  . lactulose  10 g Oral BID  . metoprolol tartrate  25 mg Oral BID  . multivitamin with minerals  1 tablet Oral Daily  . pantoprazole  40 mg Oral Daily  . predniSONE  25 mg Oral Q breakfast  . protein supplement  6 g Oral TID WC  . saccharomyces boulardii  250 mg Oral BID  . sertraline  50 mg Oral QHS  . tamsulosin  0.4 mg Oral Daily   Continuous Infusions:   Principal Problem:   Altered mental status Active Problems:   Essential hypertension   Diabetes mellitus with peripheral vascular disease (HCC)   UTI (urinary tract infection) due to urinary indwelling catheter (HCC)   GERD (gastroesophageal reflux disease)   COPD (chronic obstructive pulmonary disease) (HCC)   Anemia of chronic disease   Yeast UTI   CAD (coronary artery disease), native coronary artery    Time spent: 25 min    [facility]  [facility] Pager [phone]*. If 7PM-7AM, please contact night-coverage at [email or web address], password [email or web address] 08/12/2015, 5:27 PM  LOS: 1 day

## 2015-08-12 NOTE — Progress Notes (Signed)
MD paged with results of trop.

## 2015-08-13 ENCOUNTER — Encounter: Payer: Self-pay | Admitting: Internal Medicine

## 2015-08-13 DIAGNOSIS — D638 Anemia in other chronic diseases classified elsewhere: Secondary | ICD-10-CM

## 2015-08-13 DIAGNOSIS — R4182 Altered mental status, unspecified: Secondary | ICD-10-CM

## 2015-08-13 DIAGNOSIS — B3749 Other urogenital candidiasis: Secondary | ICD-10-CM

## 2015-08-13 LAB — GLUCOSE, CAPILLARY
GLUCOSE-CAPILLARY: 145 mg/dL — AB (ref 65–99)
Glucose-Capillary: 191 mg/dL — ABNORMAL HIGH (ref 65–99)
Glucose-Capillary: 278 mg/dL — ABNORMAL HIGH (ref 65–99)
Glucose-Capillary: 290 mg/dL — ABNORMAL HIGH (ref 65–99)
Glucose-Capillary: 222 mg/dL — ABNORMAL HIGH (ref 65–99)

## 2015-08-13 LAB — CBC WITH DIFFERENTIAL/PLATELET
Basophils Absolute: 0 10*3/uL (ref 0.0–0.1)
Basophils Relative: 0 %
Eosinophils Absolute: 0 10*3/uL (ref 0.0–0.7)
Eosinophils Relative: 1 %
HEMATOCRIT: 28.9 % — AB (ref 39.0–52.0)
HEMOGLOBIN: 9.4 g/dL — AB (ref 13.0–17.0)
LYMPHS ABS: 0.2 10*3/uL — AB (ref 0.7–4.0)
LYMPHS PCT: 6 %
MCH: 28.2 pg (ref 26.0–34.0)
MCHC: 32.5 g/dL (ref 30.0–36.0)
MCV: 86.8 fL (ref 78.0–100.0)
Monocytes Absolute: 0.3 10*3/uL (ref 0.1–1.0)
Monocytes Relative: 9 %
NEUTROS ABS: 3 10*3/uL (ref 1.7–7.7)
NEUTROS PCT: 84 %
Platelets: 158 10*3/uL (ref 150–400)
RBC: 3.33 MIL/uL — AB (ref 4.22–5.81)
RDW: 17.1 % — ABNORMAL HIGH (ref 11.5–15.5)
WBC: 3.6 10*3/uL — AB (ref 4.0–10.5)

## 2015-08-13 LAB — COMPREHENSIVE METABOLIC PANEL
ALK PHOS: 41 U/L (ref 38–126)
ALT: 13 U/L — AB (ref 17–63)
AST: 13 U/L — ABNORMAL LOW (ref 15–41)
Albumin: 2.5 g/dL — ABNORMAL LOW (ref 3.5–5.0)
Anion gap: 4 — ABNORMAL LOW (ref 5–15)
BUN: 13 mg/dL (ref 6–20)
CALCIUM: 8.1 mg/dL — AB (ref 8.9–10.3)
CO2: 30 mmol/L (ref 22–32)
CREATININE: 0.76 mg/dL (ref 0.61–1.24)
Chloride: 108 mmol/L (ref 101–111)
Glucose, Bld: 157 mg/dL — ABNORMAL HIGH (ref 65–99)
Potassium: 3.8 mmol/L (ref 3.5–5.1)
SODIUM: 142 mmol/L (ref 135–145)
Total Bilirubin: 0.4 mg/dL (ref 0.3–1.2)
Total Protein: 4.8 g/dL — ABNORMAL LOW (ref 6.5–8.1)

## 2015-08-13 LAB — URINE CULTURE: Culture: >=100000

## 2015-08-13 MED ORDER — IPRATROPIUM-ALBUTEROL 0.5-2.5 (3) MG/3ML IN SOLN
3.0000 mL | Freq: Two times a day (BID) | RESPIRATORY_TRACT | Status: DC
  Administered 2015-08-14: 3 mL via RESPIRATORY_TRACT
  Filled 2015-08-13: qty 3
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
## 2015-08-13 NOTE — Care Management Note (Signed)
Case Management Note  Patient Details  Name: JACKSTON OAXACA MRN: [id number] Date of Birth: 1929-08-31  Subjective/Objective:    CM noted pt to be admitted from facility. Alerted CSW.                 Action/Plan:CM will follow for disposition and discharge needs.    Expected Discharge Date:   (unknown)               Expected Discharge Plan:     In-House Referral:  Clinical Social Work  Discharge planning Services  CM Consult  Post Acute Care Choice:    Choice offered to:     DME Arranged:    DME Agency:     HH Arranged:    HH Agency:     Status of Service:  In process, will continue to follow  Medicare Important Message Given:    Date Medicare IM Given:    Medicare IM give by:    Date Additional Medicare IM Given:    Additional Medicare Important Message give by:     If discussed at [place] of Stay Meetings, dates discussed:    Additional Comments:  Delrae Sawyers, RN 08/13/2015, 10:00 AM

## 2015-08-13 NOTE — Progress Notes (Signed)
[facility] PROGRESS NOTE  [name] RWE:[id number] DOB: [birth year] DOA: 08/11/2015 PCP: No primary care provider on file.  Assessment/Plan: 1. Acute on chronic diastolic CHF- patient has history of diastolic CHF echo in May 6195 showing grade 1 diastolic dysfunction. BNP was significantly elevated 1246, patient started on IV Lasix 20 mg every 12 hours. Slowly improving,will continue  diuresis with Lasix. 2. Elevated troponin- patient had mild elevation of troponin 0.06, likely from above from demand ischemia. He denies any chest pain. EKG showed normal sinus rhythm. 3. Yeast UTI- patient has chronic indwelling Foley catheter, continue oral Fluconazole and ceftriaxone which was started in the previous admission. 4. COPD- stable, continue prednisone 25 mg daily, DuoNeb nebulizers every 6 hours 5. Diabetes mellitus- continue sliding scale insulin with NovoLog 6. Hypertension- continue metoprolol 7. CAD- stable continue aspirin and Plavix 8. Depression- and Zoloft 9. DVT prophylaxis- Lovenox  Code Status: DNR Family Communication: Discussed with caregivers at bedside Disposition Plan: Skilled nursing facility when medically stable   Consultants:  None  Procedures:  None  Antibiotics:  None  HPI/Subjective: 81 year old male with a history of COPD, CAD, GERD, chronic diastolic CHF, hypertension who was discharged from the hospital on 08/10/2015 after he was treated for pneumonia and UTI. Was brought to the hospital for change in mental status with agitation. UA was abnormal and showed yeast UTI.  Patient feels better this morning, breathing is improved with IV Lasix. Good diuresis from Lasix.   he he Objective: Filed Vitals:   08/13/15 0625  BP: 132/49  Pulse: 68  Temp: 97.5 F (36.4 C)  Resp: 18    Intake/Output Summary (Last 24 hours) at 08/13/15 1312 Last data filed at 08/12/15 2112  Gross per 24 hour  Intake     50 ml  Output   1325 ml  Net  -1275 ml    Filed Weights   08/11/15 2309  Weight: 58 kg (127 lb 13.9 oz)    Exam:   General:  Appears in mild respiratory distress  Cardiovascular: S1-S2 regular  Respiratory: Decreased breath sounds bilaterally at lung bases  Abdomen: Soft, nontender, no organomegaly  Musculoskeletal: No edema noted in the lower extremities, no cyanosis or clubbing  Data Reviewed: Basic Metabolic Panel:  Recent Labs Lab 08/09/15 0512 08/10/15 0710 08/11/15 1627 08/12/15 0015 08/12/15 0505 08/13/15 0457  NA 143 139 142  --  144 142  K 3.8 3.2* 3.8  --  3.8 3.8  CL 111 109 110  --  110 108  CO2 27 24 26   --  28 30  GLUCOSE 84 105* 117*  --  89 157*  BUN 13 11 10   --  8 13  CREATININE 0.73 0.67 0.71  --  0.63 0.76  CALCIUM 8.1* 7.9* 8.2*  --  8.1* 8.1*  MG  --   --   --  2.0 1.9  --   PHOS  --   --   --  3.3 3.0  --    Liver Function Tests:  Recent Labs Lab 08/06/15 2225 08/07/15 0113 08/11/15 1627 08/12/15 0505 08/13/15 0457  AST 15 13* 19 16 13*  ALT 11* 9* 16* 15* 13*  ALKPHOS 42 41 45 43 41  BILITOT 0.4 <0.1* 0.9 0.6 0.4  PROT 5.0* 4.8* 5.3* 5.1* 4.8*  ALBUMIN 2.6* 2.5* 2.8* 2.6* 2.5*    Recent Labs Lab 08/06/15 1541  LIPASE 35   No results for input(s): AMMONIA in the last 168 hours. CBC:  Recent Labs Lab 08/06/15 1541 08/06/15 2225  08/09/15 0512 08/10/15 0710 08/11/15 1627 08/12/15 0505 08/13/15 0457  WBC 4.2 3.7*  < > 2.8* 3.6* 2.8* 3.2* 3.6*  NEUTROABS 4.1 3.4  --   --   --  2.3 2.5 3.0  HGB 9.2* 8.7*  < > 8.9* 9.5* 10.4* 10.0* 9.4*  HCT 28.7* 26.8*  < > 28.0* 28.9* 32.0* 31.0* 28.9*  MCV 87.0 87.0  < > 87.5 86.0 87.4 88.1 86.8  PLT 167 173  < > 139* 147* 174 167 158  < > = values in this interval not displayed. Cardiac Enzymes:  Recent Labs Lab 08/11/15 1627 08/12/15 0015 08/12/15 0505 08/12/15 1128  TROPONINI 0.04* 0.06* 0.06* 0.06*   BNP (last 3 results)  Recent Labs  04/28/15 1502 07/01/15 0121 08/12/15 1128  BNP 304.9* 344.9*  1246.0*    ProBNP (last 3 results) No results for input(s): PROBNP in the last 8760 hours.  CBG:  Recent Labs Lab 08/12/15 1224 08/12/15 1654 08/12/15 2107 08/13/15 0737 08/13/15 1152  GLUCAP 214* 226* 191* 145* 278*    Recent Results (from the past 240 hour(s))  Culture, blood (x 2)     Status: None   Collection Time: 08/06/15 10:05 PM  Result Value Ref Range Status   Specimen Description BLOOD RIGHT HAND  Final   Special Requests BOTTLES DRAWN AEROBIC ONLY 4CC  Final   Culture   Final    NO GROWTH 5 DAYS Performed at [facility]    Report Status 08/12/2015 FINAL  Final  Culture, blood (x 2)     Status: None   Collection Time: 08/06/15 10:15 PM  Result Value Ref Range Status   Specimen Description BLOOD RIGHT ANTECUBITAL  Final   Special Requests BOTTLES DRAWN AEROBIC AND ANAEROBIC 5CC  Final   Culture   Final    NO GROWTH 5 DAYS Performed at [facility]    Report Status 08/12/2015 FINAL  Final  MRSA PCR Screening     Status: None   Collection Time: 08/07/15  1:14 AM  Result Value Ref Range Status   MRSA by PCR NEGATIVE NEGATIVE Final    Comment:        The GeneXpert MRSA Assay (FDA approved for NASAL specimens only), is one component of a comprehensive MRSA colonization surveillance program. It is not intended to diagnose MRSA infection nor to guide or monitor treatment for MRSA infections.   Urine culture     Status: None   Collection Time: 08/11/15  4:52 PM  Result Value Ref Range Status   Specimen Description URINE, CLEAN CATCH  Final   Special Requests NONE  Final   Culture   Final    >=100,000 COLONIES/mL YEAST Performed at [facility]    Report Status 08/13/2015 FINAL  Final  MRSA PCR Screening     Status: None   Collection Time: 08/12/15 12:26 AM  Result Value Ref Range Status   MRSA by PCR NEGATIVE NEGATIVE Final    Comment:        The GeneXpert MRSA Assay (FDA approved for NASAL specimens only), is one component  of a comprehensive MRSA colonization surveillance program. It is not intended to diagnose MRSA infection nor to guide or monitor treatment for MRSA infections.      Studies: Dg Chest 2 View  08/11/2015   CLINICAL DATA:  UTI infection.  Shortness of breath.  EXAM: CHEST  2 VIEW  COMPARISON:  08/03/2015  FINDINGS:  There is a normal heart size. Small bilateral pleural effusions identified. There is mild interstitial edema identified. Atelectasis noted within both mid lung regions and both lower lobes. Numerous calcified mediastinal and hilar lymph nodes identified.  IMPRESSION: 1. Bilateral pleural effusions 2. Bibasilar atelectasis 3. Prior granulomatous disease.   Electronically Signed   By: Kerby Moors M.D.   On: 08/11/2015 16:39   Ct Head Wo Contrast  08/11/2015   CLINICAL DATA:  Altered mental status and combative.  EXAM: CT HEAD WITHOUT CONTRAST  TECHNIQUE: Contiguous axial images were obtained from the base of the skull through the vertex without contrast.  COMPARISON:  03/05/2012  FINDINGS: There is diffuse cerebral atrophy which is unchanged. Again noted is low-density in the periventricular white matter suggesting chronic changes. No evidence for acute hemorrhage, mass lesion, midline shift, hydrocephalus or large infarct. Mild encephalomalacia in the left occipital lobe is unchanged. Question old small infarct in the right cerebellar hemisphere which is unchanged. Mild mucosal disease in the maxillary sinuses. No acute bone abnormality.  IMPRESSION: No acute intracranial abnormality.  Stable atrophy and evidence for chronic small vessel ischemic disease.   Electronically Signed   By: Markus Daft M.D.   On: 08/11/2015 16:36    Scheduled Meds: . antiseptic oral rinse  7 mL Mouth Rinse BID  . aspirin  81 mg Oral q1800  . cefTRIAXone (ROCEPHIN)  IV  1 g Intravenous Q24H  . clopidogrel  75 mg Oral Daily  . docusate sodium  100 mg Oral BID  . enoxaparin (LOVENOX) injection  40 mg  Subcutaneous Q24H  . feeding supplement (GLUCERNA SHAKE)  237 mL Oral TID BM  . fluconazole  100 mg Oral Daily  . furosemide  20 mg Intravenous BID  . insulin aspart  0-9 Units Subcutaneous TID WC  . ipratropium-albuterol  3 mL Nebulization Q6H  . lactulose  10 g Oral BID  . metoprolol tartrate  25 mg Oral BID  . multivitamin with minerals  1 tablet Oral Daily  . pantoprazole  40 mg Oral Daily  . predniSONE  25 mg Oral Q breakfast  . protein supplement  6 g Oral TID WC  . saccharomyces boulardii  250 mg Oral BID  . sertraline  50 mg Oral QHS  . tamsulosin  0.4 mg Oral Daily   Continuous Infusions:   Principal Problem:   Altered mental status Active Problems:   Essential hypertension   Diabetes mellitus with peripheral vascular disease (HCC)   UTI (urinary tract infection) due to urinary indwelling catheter (HCC)   GERD (gastroesophageal reflux disease)   COPD (chronic obstructive pulmonary disease) (HCC)   Anemia of chronic disease   Yeast UTI   CAD (coronary artery disease), native coronary artery   Acute diastolic CHF (congestive heart failure) ([place])    Time spent: 25 min    [facility]  [facility] Pager [phone]*. If 7PM-7AM, please contact night-coverage at [email or web address], password [facility] 08/13/2015, 1:12 PM  LOS: 2 days

## 2015-08-13 NOTE — Clinical Social Work Note (Signed)
Clinical Social Work Assessment  Patient Details  Name: [name] MRN: [id number] Date of Birth: 03/31/1929  Date of referral:  08/13/15               Reason for consult:  Discharge Planning                Permission sought to share information with:  Other Production manager ) Permission granted to share information::     Name::      [name])  Agency::     Relationship::     Contact Information:     Housing/Transportation Living arrangements for the past 2 months:  [place] of Information:  Medical laboratory scientific officer Patient Interpreter Needed:  None Criminal Activity/Legal Involvement Pertinent to Current Situation/Hospitalization:  No - Comment as needed Significant Relationships:  Friend Lives with:  Facility Resident Do you feel safe going back to the place where you live?  Yes Need for family participation in patient care:  No (Coment)  Care giving concerns:  Pt admitted from [facility].   Social Worker assessment / plan:  BSW Engineer, [employer] of [place], [name].  Pt admitted from [facility]. HCPOA reports pt is on waiting list at [facility].   HCPOA requested BSW to call [name] for update on waiting list.  BSW confirmed approval of HCPOA for pt readmission to [facility] when pt is medically stable for dc if [name] has no bed available.  HCPOA understanding of decision.   CSW to follow-up with [name].  CSW to continue to follow.    Employment status:  Retired Forensic scientist:  Information systems manager, Other (Comment Required) [place]) PT Recommendations:  [place] / Referral to community resources:  [place]  Patient/Family's Response to care:  HCPOA pleased with care in the hospital and hoping for pt medical stability soon.  Patient/Family's Understanding of and Emotional Response to Diagnosis, Current Treatment, and Prognosis:  HCPOA understanding of  medical diagnoses.  Emotional Assessment Appearance:    Attitude/Demeanor/Rapport:    Affect (typically observed):    Orientation:  Oriented to Self, Oriented to Place Alcohol / Substance use:  Never Used Psych involvement (Current and /or in the community):  No (Comment)  Discharge Needs  Concerns to be addressed:  No discharge needs identified Readmission within the last 30 days:  Yes Current discharge risk:    Barriers to Discharge:  No Barriers Identified   [name], Student-SW 08/13/2015, 12:48 PM

## 2015-08-13 NOTE — Progress Notes (Signed)
This encounter was created in error - please disregard.

## 2015-08-14 DIAGNOSIS — E1151 Type 2 diabetes mellitus with diabetic peripheral angiopathy without gangrene: Secondary | ICD-10-CM

## 2015-08-14 LAB — GLUCOSE, CAPILLARY
Glucose-Capillary: 134 mg/dL — ABNORMAL HIGH (ref 65–99)
Glucose-Capillary: 204 mg/dL — ABNORMAL HIGH (ref 65–99)

## 2015-08-14 LAB — CBC WITH DIFFERENTIAL/PLATELET
BASOS ABS: 0 10*3/uL (ref 0.0–0.1)
BASOS PCT: 0 %
EOS PCT: 1 %
Eosinophils Absolute: 0.1 10*3/uL (ref 0.0–0.7)
HEMATOCRIT: 30.9 % — AB (ref 39.0–52.0)
Hemoglobin: 9.9 g/dL — ABNORMAL LOW (ref 13.0–17.0)
Lymphocytes Relative: 6 %
Lymphs Abs: 0.2 10*3/uL — ABNORMAL LOW (ref 0.7–4.0)
MCH: 28.4 pg (ref 26.0–34.0)
MCHC: 32 g/dL (ref 30.0–36.0)
MCV: 88.8 fL (ref 78.0–100.0)
MONO ABS: 0.3 10*3/uL (ref 0.1–1.0)
Monocytes Relative: 10 %
NEUTROS ABS: 2.9 10*3/uL (ref 1.7–7.7)
Neutrophils Relative %: 83 %
PLATELETS: 137 10*3/uL — AB (ref 150–400)
RBC: 3.48 MIL/uL — AB (ref 4.22–5.81)
RDW: 17.2 % — AB (ref 11.5–15.5)
WBC: 3.5 10*3/uL — AB (ref 4.0–10.5)

## 2015-08-14 LAB — COMPREHENSIVE METABOLIC PANEL
ALBUMIN: 2.5 g/dL — AB (ref 3.5–5.0)
ALT: 10 U/L — AB (ref 17–63)
AST: 18 U/L (ref 15–41)
Alkaline Phosphatase: 40 U/L (ref 38–126)
Anion gap: 6 (ref 5–15)
BUN: 13 mg/dL (ref 6–20)
CHLORIDE: 107 mmol/L (ref 101–111)
CO2: 28 mmol/L (ref 22–32)
Calcium: 8.1 mg/dL — ABNORMAL LOW (ref 8.9–10.3)
Creatinine, Ser: 0.64 mg/dL (ref 0.61–1.24)
GFR calc Af Amer: >60 mL/min (ref >60)
GFR calc non Af Amer: >60 mL/min (ref >60)
GLUCOSE: 119 mg/dL — AB (ref 65–99)
POTASSIUM: 3.5 mmol/L (ref 3.5–5.1)
Sodium: 141 mmol/L (ref 135–145)
Total Bilirubin: 0.6 mg/dL (ref 0.3–1.2)
Total Protein: 4.9 g/dL — ABNORMAL LOW (ref 6.5–8.1)

## 2015-08-14 MED ORDER — PREDNISONE 20 MG PO TABS: 20.0000 mg | ORAL_TABLET | Freq: Every day | ORAL | Status: AC

## 2015-08-14 MED ORDER — LACTULOSE 10 GM/15ML PO SOLN: 10.0000 g | Freq: Two times a day (BID) | ORAL | Status: AC | PRN

## 2015-08-14 MED ORDER — LACTULOSE 10 GM/15ML PO SOLN: 10.0000 g | Freq: Every day | ORAL | Status: DC | PRN

## 2015-08-14 MED ORDER — CIPROFLOXACIN HCL 500 MG PO TABS: 500.0000 mg | ORAL_TABLET | Freq: Two times a day (BID) | ORAL | Status: AC

## 2015-08-14 MED ORDER — FLUCONAZOLE 100 MG PO TABS: 100.0000 mg | ORAL_TABLET | Freq: Every day | ORAL | Status: AC

## 2015-08-14 MED ORDER — POTASSIUM CHLORIDE ER 20 MEQ PO TBCR: 10.0000 meq | EXTENDED_RELEASE_TABLET | Freq: Every day | ORAL | Status: AC

## 2015-08-14 MED ORDER — FUROSEMIDE 40 MG PO TABS: 40.0000 mg | ORAL_TABLET | Freq: Every day | ORAL | Status: AC

## 2015-08-14 NOTE — Clinical Social Work Placement (Signed)
   CLINICAL SOCIAL WORK PLACEMENT  NOTE  Date:  08/14/2015  Patient Details  Name: [name] MRN: [id number] Date of Birth: May 24, 1929  Clinical Social Work is seeking post-discharge placement for this patient at the [place] level of care (*CSW will initial, date and re-position this form in  chart as items are completed):  No (pt HCPOA hopeful [name] at [place] can accept, but agreeable to return to [facility] if not)   Patient/family provided with [place] Work Department's list of facilities offering this level of care within the geographic area requested by the patient (or if unable, by the patient's family).  No   Patient/family informed of their freedom to choose among providers that offer the needed level of care, that participate in Medicare, Medicaid or managed care program needed by the patient, have an available bed and are willing to accept the patient.  No   Patient/family informed of 's ownership interest in [facility] and [facility], as well as of the fact that they are under no obligation to receive care at these facilities.  PASRR submitted to EDS on       PASRR number received on       Existing PASRR number confirmed on 08/14/15     FL2 transmitted to all facilities in geographic area requested by pt/family on 08/14/15     FL2 transmitted to all facilities within larger geographic area on       Patient informed that his/her managed care company has contracts with or will negotiate with certain facilities, including the following:        Yes   Patient/family informed of bed offers received.  Patient chooses bed at [facility] at [place] recommends and patient chooses bed at      Patient to be transferred to [facility] on 08/14/15.  Patient to be transferred to facility by ambulance Corey Harold)     Patient family notified on 08/14/15 of transfer.  Name of family  member notified:  pt HCPOA, [name] via telephone     PHYSICIAN Please sign DNR, Please sign FL2     Additional Comment:    _______________________________________________ Ladell Pier, LCSW 08/14/2015, 2:23 PM

## 2015-08-14 NOTE — Progress Notes (Signed)
Called report to [place], Therapist, sports at [employer].  All questions answered.  Packet ready for PTAR pickup.
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
## 2015-08-14 NOTE — Care Management Important Message (Signed)
Important Message  Patient Details  Name: ARSENIY TOOMEY MRN: [id number] Date of Birth: September 02, 1929   Medicare Important Message Given:  Yes-second notification given    Camillo Flaming 08/14/2015, 11:30 AMImportant Message  Patient Details  Name: HOLDEN MANISCALCO MRN: [id number] Date of Birth: [birth year]   Medicare Important Message Given:  Yes-second notification given    Camillo Flaming 08/14/2015, 11:29 AM

## 2015-08-14 NOTE — Progress Notes (Signed)
CSW continuing to follow.   CSW received notification from [facility] [place] that facility can accept pt upon discharge. CSW spoke with pt HCPOA, [name] who confirmed that she wishes for pt to go to [facility] [place] upon discharge instead of pt returning to [facility]. Pt HCPOA very pleased that [name] can accept.  CSW contacted [name] and confirmed facility can accept pt today.  CSW notified [place] that pt will not be returning to facility.  CSW facilitated pt discharge needs including contacting facility, faxing pt discharge information via [facility], discussing with pt HCPOA via telephone, providing RN phone number to call report, and arranging ambulance transport via [facility] scheduled for 3 pm pick up.  No further social work needs identified at this time.  CSW signing off.   Alison Murray, MSW, [place] [facility] [phone]
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
## 2016-01-05 IMAGING — CR DG CHEST 1V PORT
1 series · 1 of 1 positions shown · non-contrast
Comparison: There earlier radiograph dated 07/16/2015

CLINICAL DATA: 86-year-old male with shortness of breath and
decreased O2 sats since the last radiograph.

EXAM:
PORTABLE CHEST - 1 VIEW

[AP]
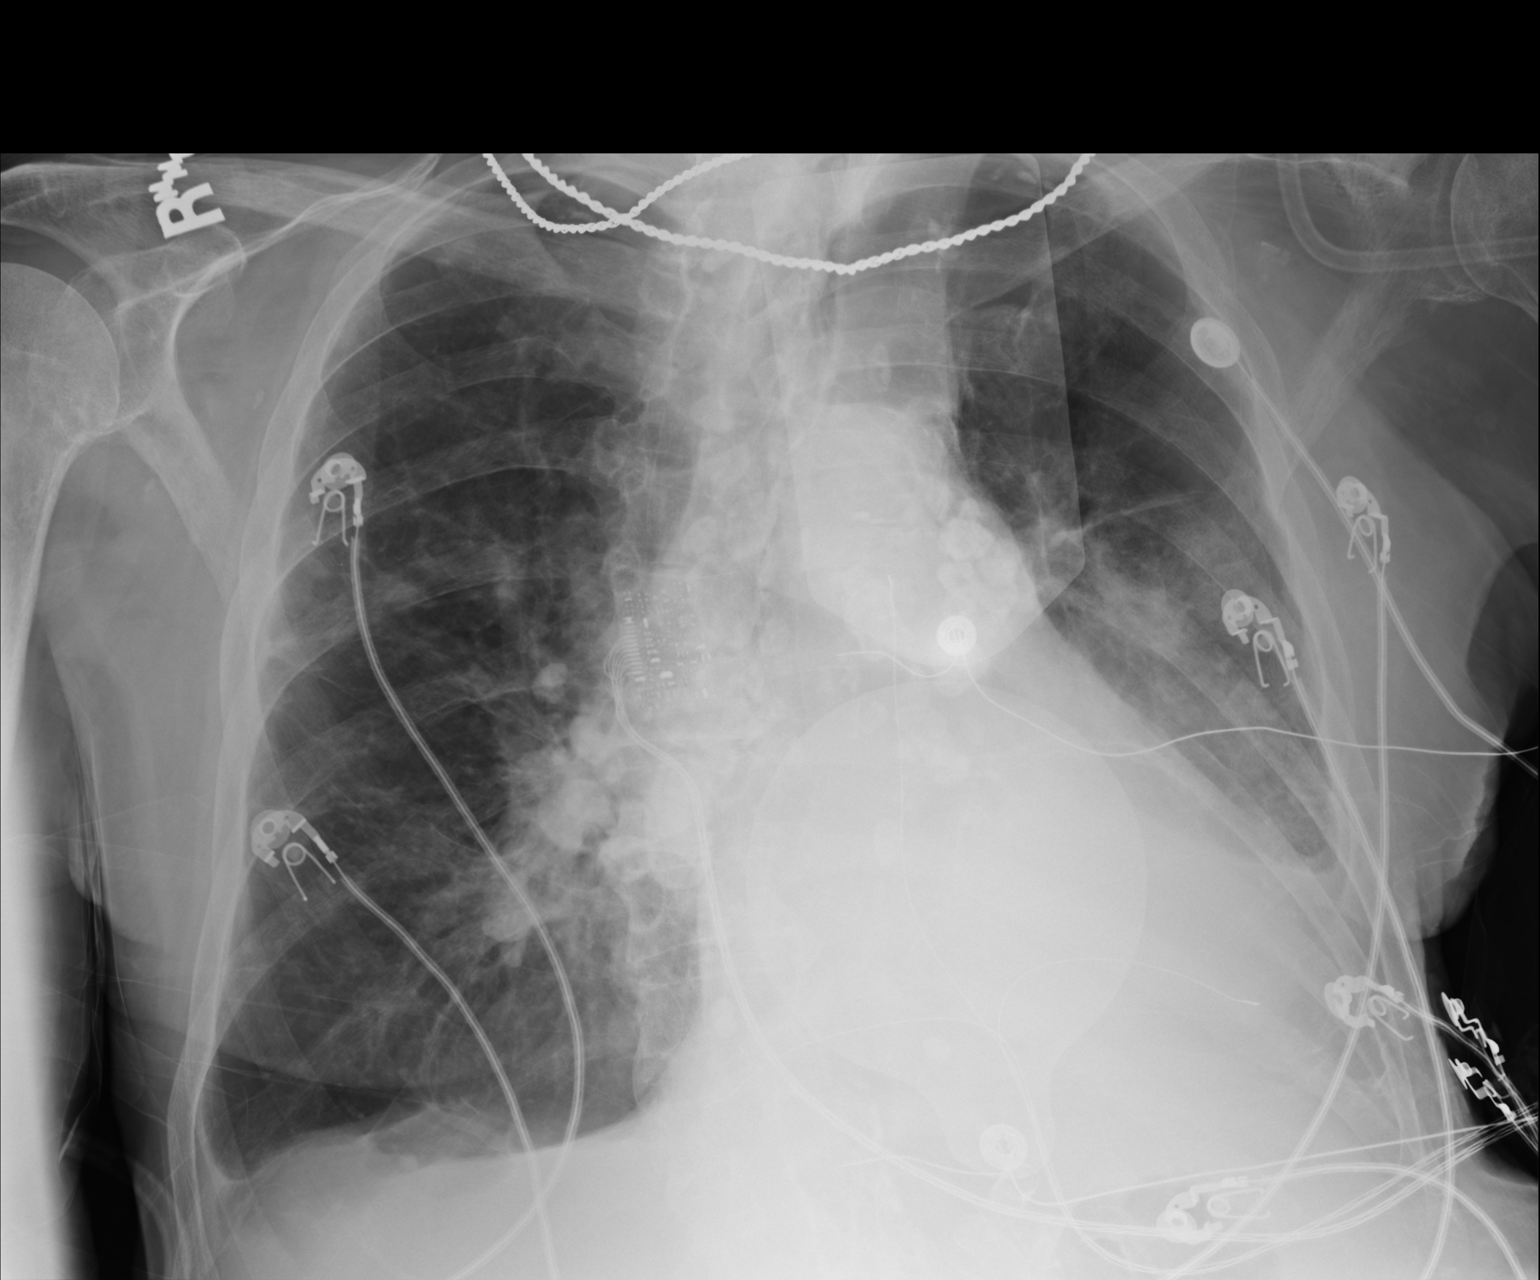

[1 of 1 positions shown; findings below may reference images not displayed]

FINDINGS: There is marked cardiomegaly. There is opacification of the left
lower lung field concerning for left-sided pleural effusion with
associated atelectasis/ pneumonia. A focal area of increased density
is noted in the left mid lung field, new from prior study. The right
lung is clear. There is blunting of the right costophrenic angle
possibly trace right pleural effusion. Multiple calcified hilar and
mediastinal granuloma again noted. The osseous structures are
grossly unremarkable.
IMPRESSION: Marked cardiomegaly.

Opacification of the left lower lung field possibly related to
pleural effusion and associated atelectasis/ pneumonia. PA and
lateral views or CT of the chest may provide better evaluation
clinically indicated.

## 2016-01-07 IMAGING — CR DG CHEST 1V PORT
1 series · 1 of 1 positions shown · non-contrast
Comparison: 07/17/2015

CLINICAL DATA: Shortness of breath

EXAM:
PORTABLE CHEST - 1 VIEW

[AP]
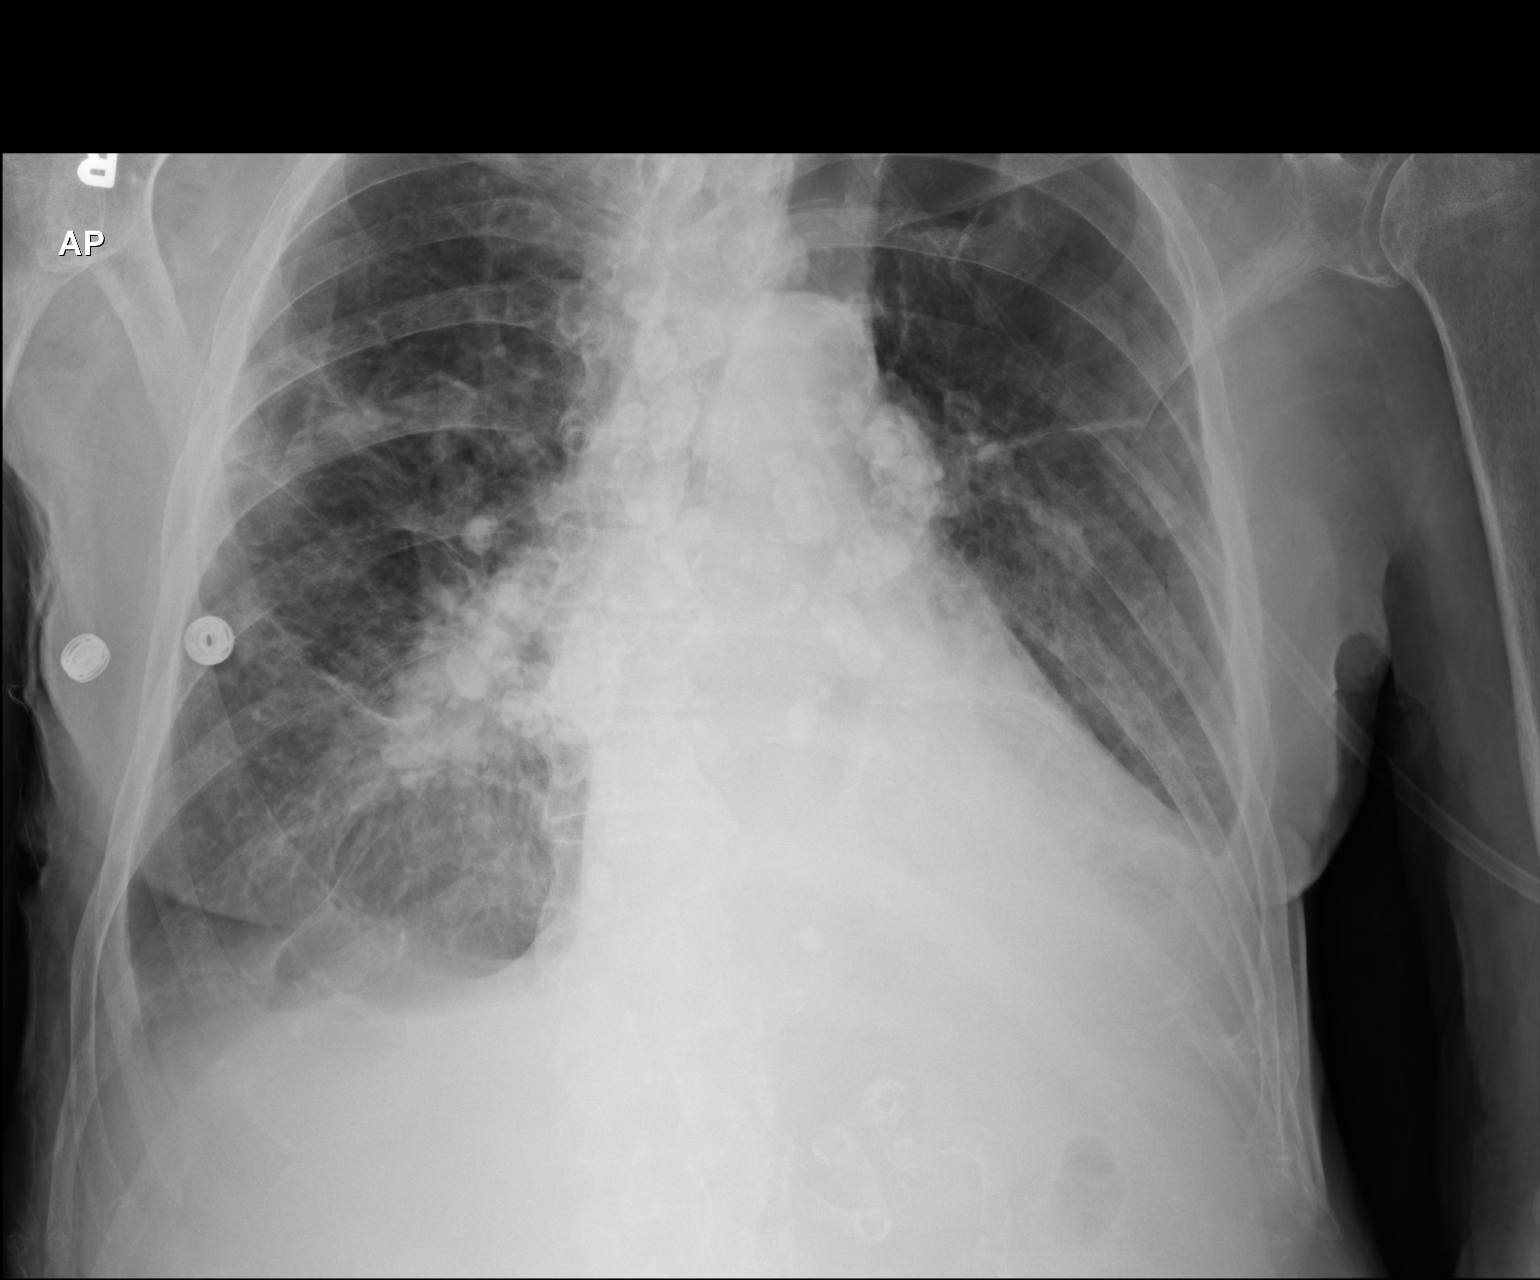

[1 of 1 positions shown; findings below may reference images not displayed]

FINDINGS: Cardiomegaly persists with stable retrocardiac consolidation and
slight interval increase in hazy right lower lobe airspace opacity.
Patchy upper lobe airspace opacities are also noted. Small pleural
effusions are larger. Presumed granulomatous hilar calcifications.
IMPRESSION: Increased bibasilar patchy airspace opacities with small effusions.
This could represent worsening pneumonia although asymmetric edema
superimposed on emphysematous change could appear similar.

## 2016-06-07 DEATH — deceased
